# Patient Record
Sex: Female | Born: 1945 | Race: White | Hispanic: No | Smoking: Current every day smoker
Health system: Southern US, Community
[De-identification: ages and names within clinical notes are randomized; demographics above are authoritative.]

## PROBLEM LIST (undated history)

## (undated) DIAGNOSIS — R5383 Other fatigue: Secondary | ICD-10-CM

## (undated) DIAGNOSIS — R0602 Shortness of breath: Secondary | ICD-10-CM

## (undated) DIAGNOSIS — G709 Myoneural disorder, unspecified: Secondary | ICD-10-CM

## (undated) DIAGNOSIS — F209 Schizophrenia, unspecified: Secondary | ICD-10-CM

## (undated) DIAGNOSIS — J449 Chronic obstructive pulmonary disease, unspecified: Secondary | ICD-10-CM

## (undated) DIAGNOSIS — I2510 Atherosclerotic heart disease of native coronary artery without angina pectoris: Secondary | ICD-10-CM

## (undated) DIAGNOSIS — M1990 Unspecified osteoarthritis, unspecified site: Secondary | ICD-10-CM

## (undated) DIAGNOSIS — F319 Bipolar disorder, unspecified: Secondary | ICD-10-CM

## (undated) DIAGNOSIS — F32A Depression, unspecified: Secondary | ICD-10-CM

## (undated) DIAGNOSIS — I10 Essential (primary) hypertension: Secondary | ICD-10-CM

## (undated) DIAGNOSIS — E559 Vitamin D deficiency, unspecified: Secondary | ICD-10-CM

## (undated) DIAGNOSIS — F329 Major depressive disorder, single episode, unspecified: Secondary | ICD-10-CM

## (undated) DIAGNOSIS — F17200 Nicotine dependence, unspecified, uncomplicated: Secondary | ICD-10-CM

## (undated) DIAGNOSIS — R5381 Other malaise: Secondary | ICD-10-CM

## (undated) DIAGNOSIS — Z9981 Dependence on supplemental oxygen: Secondary | ICD-10-CM

## (undated) DIAGNOSIS — K5900 Constipation, unspecified: Secondary | ICD-10-CM

## (undated) DIAGNOSIS — E785 Hyperlipidemia, unspecified: Secondary | ICD-10-CM

## (undated) HISTORY — DX: Constipation, unspecified: K59.00

## (undated) HISTORY — DX: Nicotine dependence, unspecified, uncomplicated: F17.200

## (undated) HISTORY — DX: Essential (primary) hypertension: I10

## (undated) HISTORY — PX: ABDOMINAL EXPLORATION SURGERY: SHX538

## (undated) HISTORY — DX: Other fatigue: R53.81

## (undated) HISTORY — PX: VAGINAL HYSTERECTOMY: SUR661

## (undated) HISTORY — PX: HAND SURGERY: SHX662

## (undated) HISTORY — DX: Atherosclerotic heart disease of native coronary artery without angina pectoris: I25.10

## (undated) HISTORY — PX: CORONARY ANGIOPLASTY WITH STENT PLACEMENT: SHX49

## (undated) HISTORY — DX: Myoneural disorder, unspecified: G70.9

## (undated) HISTORY — DX: Other fatigue: R53.83

## (undated) HISTORY — DX: Chronic obstructive pulmonary disease, unspecified: J44.9

## (undated) HISTORY — PX: INGUINAL HERNIA REPAIR: SUR1180

## (undated) HISTORY — PX: APPENDECTOMY: SHX54

## (undated) HISTORY — DX: Vitamin D deficiency, unspecified: E55.9

## (undated) HISTORY — PX: TONSILLECTOMY: SUR1361

## (undated) HISTORY — DX: Unspecified osteoarthritis, unspecified site: M19.90

## (undated) HISTORY — DX: Hyperlipidemia, unspecified: E78.5

## (undated) HISTORY — DX: Bipolar disorder, unspecified: F31.9

---

## 1999-09-10 ENCOUNTER — Inpatient Hospital Stay (HOSPITAL_COMMUNITY): Admission: EM | Admit: 1999-09-10 | Discharge: 1999-09-12 | Payer: Self-pay | Admitting: Emergency Medicine

## 1999-09-10 ENCOUNTER — Encounter: Payer: Self-pay | Admitting: Emergency Medicine

## 1999-09-11 HISTORY — PX: CORONARY ANGIOPLASTY: SHX604

## 2000-01-29 ENCOUNTER — Encounter: Payer: Self-pay | Admitting: Emergency Medicine

## 2000-01-30 ENCOUNTER — Inpatient Hospital Stay (HOSPITAL_COMMUNITY): Admission: EM | Admit: 2000-01-30 | Discharge: 2000-02-02 | Payer: Self-pay | Admitting: Psychiatry

## 2002-05-29 ENCOUNTER — Other Ambulatory Visit: Admission: RE | Admit: 2002-05-29 | Discharge: 2002-05-29 | Payer: Self-pay | Admitting: Gynecology

## 2002-11-26 ENCOUNTER — Encounter
Admission: RE | Admit: 2002-11-26 | Discharge: 2003-02-24 | Payer: Self-pay | Admitting: Physical Medicine & Rehabilitation

## 2003-05-12 ENCOUNTER — Encounter
Admission: RE | Admit: 2003-05-12 | Discharge: 2003-08-10 | Payer: Self-pay | Admitting: Physical Medicine & Rehabilitation

## 2003-05-28 ENCOUNTER — Encounter
Admission: RE | Admit: 2003-05-28 | Discharge: 2003-05-28 | Payer: Self-pay | Admitting: Physical Medicine & Rehabilitation

## 2003-05-29 ENCOUNTER — Encounter
Admission: RE | Admit: 2003-05-29 | Discharge: 2003-05-29 | Payer: Self-pay | Admitting: Physical Medicine & Rehabilitation

## 2003-06-06 ENCOUNTER — Emergency Department (HOSPITAL_COMMUNITY): Admission: EM | Admit: 2003-06-06 | Discharge: 2003-06-06 | Payer: Self-pay | Admitting: Emergency Medicine

## 2003-06-09 ENCOUNTER — Emergency Department (HOSPITAL_COMMUNITY): Admission: EM | Admit: 2003-06-09 | Discharge: 2003-06-09 | Payer: Self-pay | Admitting: Emergency Medicine

## 2003-06-13 ENCOUNTER — Emergency Department (HOSPITAL_COMMUNITY): Admission: EM | Admit: 2003-06-13 | Discharge: 2003-06-13 | Payer: Self-pay | Admitting: Emergency Medicine

## 2003-06-15 ENCOUNTER — Ambulatory Visit (HOSPITAL_COMMUNITY): Admission: RE | Admit: 2003-06-15 | Discharge: 2003-06-15 | Payer: Self-pay | Admitting: Emergency Medicine

## 2003-08-12 ENCOUNTER — Encounter
Admission: RE | Admit: 2003-08-12 | Discharge: 2003-11-10 | Payer: Self-pay | Admitting: Physical Medicine & Rehabilitation

## 2004-01-05 ENCOUNTER — Encounter
Admission: RE | Admit: 2004-01-05 | Discharge: 2004-04-04 | Payer: Self-pay | Admitting: Physical Medicine & Rehabilitation

## 2004-05-23 ENCOUNTER — Encounter
Admission: RE | Admit: 2004-05-23 | Discharge: 2004-08-21 | Payer: Self-pay | Admitting: Physical Medicine & Rehabilitation

## 2004-05-24 ENCOUNTER — Ambulatory Visit: Payer: Self-pay | Admitting: Physical Medicine & Rehabilitation

## 2004-09-18 ENCOUNTER — Encounter
Admission: RE | Admit: 2004-09-18 | Discharge: 2004-12-17 | Payer: Self-pay | Admitting: Physical Medicine & Rehabilitation

## 2004-09-20 ENCOUNTER — Ambulatory Visit: Payer: Self-pay | Admitting: Physical Medicine & Rehabilitation

## 2005-01-11 ENCOUNTER — Encounter
Admission: RE | Admit: 2005-01-11 | Discharge: 2005-04-11 | Payer: Self-pay | Admitting: Physical Medicine & Rehabilitation

## 2005-01-11 ENCOUNTER — Ambulatory Visit: Payer: Self-pay | Admitting: Physical Medicine & Rehabilitation

## 2005-04-25 ENCOUNTER — Encounter
Admission: RE | Admit: 2005-04-25 | Discharge: 2005-07-24 | Payer: Self-pay | Admitting: Physical Medicine & Rehabilitation

## 2005-04-25 ENCOUNTER — Ambulatory Visit: Payer: Self-pay | Admitting: Physical Medicine & Rehabilitation

## 2005-08-22 ENCOUNTER — Ambulatory Visit: Payer: Self-pay | Admitting: Physical Medicine & Rehabilitation

## 2005-08-22 ENCOUNTER — Encounter
Admission: RE | Admit: 2005-08-22 | Discharge: 2005-11-20 | Payer: Self-pay | Admitting: Physical Medicine & Rehabilitation

## 2005-12-14 ENCOUNTER — Encounter
Admission: RE | Admit: 2005-12-14 | Discharge: 2006-03-14 | Payer: Self-pay | Admitting: Physical Medicine & Rehabilitation

## 2005-12-14 ENCOUNTER — Ambulatory Visit: Payer: Self-pay | Admitting: Physical Medicine & Rehabilitation

## 2006-04-05 ENCOUNTER — Ambulatory Visit: Payer: Self-pay | Admitting: Physical Medicine & Rehabilitation

## 2006-04-05 ENCOUNTER — Encounter
Admission: RE | Admit: 2006-04-05 | Discharge: 2006-07-04 | Payer: Self-pay | Admitting: Physical Medicine & Rehabilitation

## 2006-06-26 ENCOUNTER — Ambulatory Visit: Payer: Self-pay | Admitting: Physical Medicine & Rehabilitation

## 2006-08-23 ENCOUNTER — Encounter
Admission: RE | Admit: 2006-08-23 | Discharge: 2006-11-21 | Payer: Self-pay | Admitting: Physical Medicine & Rehabilitation

## 2006-08-26 ENCOUNTER — Ambulatory Visit: Payer: Self-pay | Admitting: Physical Medicine & Rehabilitation

## 2006-09-05 ENCOUNTER — Encounter
Admission: RE | Admit: 2006-09-05 | Discharge: 2006-12-04 | Payer: Self-pay | Admitting: Physical Medicine & Rehabilitation

## 2006-09-09 ENCOUNTER — Ambulatory Visit: Payer: Self-pay | Admitting: Physical Medicine & Rehabilitation

## 2006-10-31 ENCOUNTER — Ambulatory Visit: Payer: Self-pay | Admitting: Physical Medicine & Rehabilitation

## 2007-02-19 ENCOUNTER — Ambulatory Visit: Payer: Self-pay | Admitting: Physical Medicine & Rehabilitation

## 2007-02-19 ENCOUNTER — Encounter
Admission: RE | Admit: 2007-02-19 | Discharge: 2007-04-15 | Payer: Self-pay | Admitting: Physical Medicine & Rehabilitation

## 2007-06-10 ENCOUNTER — Ambulatory Visit: Payer: Self-pay | Admitting: Physical Medicine & Rehabilitation

## 2007-06-10 ENCOUNTER — Encounter
Admission: RE | Admit: 2007-06-10 | Discharge: 2007-06-12 | Payer: Self-pay | Admitting: Physical Medicine & Rehabilitation

## 2007-09-30 ENCOUNTER — Ambulatory Visit: Payer: Self-pay | Admitting: Physical Medicine & Rehabilitation

## 2007-09-30 ENCOUNTER — Encounter
Admission: RE | Admit: 2007-09-30 | Discharge: 2007-10-01 | Payer: Self-pay | Admitting: Physical Medicine & Rehabilitation

## 2008-01-27 ENCOUNTER — Encounter
Admission: RE | Admit: 2008-01-27 | Discharge: 2008-01-28 | Payer: Self-pay | Admitting: Physical Medicine & Rehabilitation

## 2008-01-28 ENCOUNTER — Ambulatory Visit: Payer: Self-pay | Admitting: Physical Medicine & Rehabilitation

## 2008-05-18 ENCOUNTER — Encounter
Admission: RE | Admit: 2008-05-18 | Discharge: 2008-05-19 | Payer: Self-pay | Admitting: Physical Medicine & Rehabilitation

## 2008-05-19 ENCOUNTER — Ambulatory Visit: Payer: Self-pay | Admitting: Physical Medicine & Rehabilitation

## 2008-07-07 ENCOUNTER — Encounter: Admission: RE | Admit: 2008-07-07 | Discharge: 2008-07-07 | Payer: Self-pay | Admitting: Internal Medicine

## 2008-09-28 ENCOUNTER — Ambulatory Visit: Payer: Self-pay | Admitting: Physical Medicine & Rehabilitation

## 2008-09-28 ENCOUNTER — Encounter
Admission: RE | Admit: 2008-09-28 | Discharge: 2008-10-08 | Payer: Self-pay | Admitting: Physical Medicine & Rehabilitation

## 2009-09-09 ENCOUNTER — Emergency Department (HOSPITAL_COMMUNITY): Admission: EM | Admit: 2009-09-09 | Discharge: 2009-09-09 | Payer: Self-pay | Admitting: Emergency Medicine

## 2010-08-05 ENCOUNTER — Encounter: Payer: Self-pay | Admitting: Physical Medicine & Rehabilitation
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
## 2010-11-28 NOTE — Assessment & Plan Note (Signed)
The patient with rheumatoid arthritis with chronic neck pain, history of  cervical spondylosis as well.  She also has history of schizophrenia and  bipolar disorder.  She has been maintained at the [facility].  She  was last seen by Dr. Thomasena Edis on November 2009.  We checked the urine  drug screen while reportedly taking 7-9 hydrocodone today.  She had  negative hydrocodone on her urine drug screen.  Similarly, she had  negative Xanax on her urine drug screen dated September 21, 2008.   I went over these results with the patient and indicated that I am not  comfortable prescribing narcotic analgesics in this situation.   We offered a list of other Pain Clinics, but would not specifically  refer her because of the inconsistency of her urine drug screen.  She  understands the situation.  I have sent a note to her psychiatrist at  [facility], Dr. Rollen Sox.   We will see her back on a p.r.n. basis.      Erick Colace, M.D.  Electronically Signed     AEK/MedQ  D:  09/28/2008 17:09:52  T:  09/29/2008 04:50:10  Job #:  [id number]   cc:   Natasha Mead, M.D.  Fax: [phone]

## 2010-12-01 NOTE — Cardiovascular Report (Signed)
[facility]  Patient:    JIANNI, SHELDEN                        MRN: [id number] Proc. Date: 09/11/99 Adm. Date:  [id number] Attending:  Loreli Dollar                        Cardiac Catheterization  INDICATIONS FOR PROCEDURE:  A 65 year old female admitted with chest pain, negative cardiac enzymes, but persistent chest pain despite IV nitroglycerin and heparin. The patient was brought to the catheterization lab for cardiac catheterization nd intervention if necessary.  DESCRIPTION OF PROCEDURE:  After obtaining informed consent, the patient was prepped and draped in the usual sterile fashion exposing the right groin. Applying local anesthetic of 1% Xylocaine, the Seldinger technique was employed, and a 6-French introducer sheath was placed into the right femoral artery.  Selective  right and left arteriography and ventriculography was performed.  RESULTS:  Hemodynamic monitoring:  Central aortic pressure was 104/63.  Left ventricular pressure 107/19, and there was no significant valve gradient noted at the time f pullback.  Ventriculography:  Ventriculography in the RAO projection was associated with marked ectopy.  The patient had normal systolic motion.  Ejection fraction was ot calculated because of the ectopy.  There was mitral regurgitation also noted because of the ectopy.  The end diastolic pressure was only 16.  Coronary arteriography:  There was calcification noted in the proximal LAD. 1. The left main:  There was actually a common ostium for both the circumflex and    the LAD with no true left main noted.  Subselective evaluation of both the LAD    and circumflex were therefore performed. 2. The LAD was a medium sized vessel that extended down to the apex of the heart.    There were some mild irregularities throughout the LAD and even in the distal    segments.  The first diagonal had minor irregularities.  On two  views, there was    concern that there may be ostial narrowing of the LAD, and a 6-French catheter    actually damped each time it was truly engaged.  I could not see reflux of the    contrast around the catheter raising the concern that there may be ostial    narrowing of the LAD.  Multiple attempts (greater than 15) to just evaluate his    area subselectively were obtained, but I could not make a firm decision.    Because of this, IVUS was used and actually showed only a 40% area of narrowing    with a 3.0 lumen (see below). 3. Circumflex:  The circumflex was a large vessel, about 4.5 mm in diameter. The    circumflex was a dominant vessel with mild irregularities throughout.  There    were a total of five OMs including the PDA.  The first OM had mild    irregularities.  The second OM had an area that was about 15 mm in length with    a maximum stenosis in the mid portion of this of about 80%.  The distal vessel    was free of disease.  The other OMs were not severely narrowed. 4. The right coronary artery is a small nondominant vessel.  Because of the concern over the ostium of the LAD, Dr. Jenne Campus was summoned, and the decision was made to do IVUS.  A  6-French 3.5 left guide catheter was used. A Trooper wire was placed in the LAD, and the IVUS device was placed well distal o the vessel with pullback being obtained.  This revealed the vessel being about mm in diameter with a lumen of about 3-3.5.  There was some calcification noted. n the ostial portion of the LAD, there was an eccentric area of calcification of about 40% narrowing.  There was still a 3.0 mm diameter lumen.  Having resolved the issue of the LAD, attention was then addressed to the obtuse marginal vessel.  A 6-French 3.5 guide catheter to the Trooper wire, and a 3.0  20-mm CrossSail balloon was used.  The guide wire was placed distal to the area of the obstruction in the second marginal vessel.  The  balloon was positioned in the area, and a total of three inflations, five x 55, eight x 60, and six x 60 seconds were performed.  With this, the area of narrowing was reduced to less than 20%.  There was a small area of focal dissection, but there was brisk TIMI-3 flow.  I did not feel that this area needed to be stented.  To do that would mean that I would have to cross the ostium of the first OM vessel which I did not want to do. The patient was given Integrilin single bolus and will be maintained on Integrilin or 80 hours.  She should be removed later today and be discharged in the morning.  PROCEDURES: 1. Left heart catheterization. 2. Ventriculography, RAO projection. 3. Selective right and left coronary arteriography. 4. Intravascular ultrasound of the left anterior descending artery. 5. Angioplasty to the obtuse marginal #2. DD:  09/11/99 TD:  09/11/99 Job: [id number]

## 2010-12-01 NOTE — Procedures (Signed)
NAME:  [name], Hill                           ACCOUNT NO.:  [id number]   MEDICAL RECORD NO.:  [id number]                   PATIENT TYPE:  REC   LOCATION:  [facility]                                  FACILITY:  [facility]   PHYSICIAN:  Celene Kras, MD                     DATE OF BIRTH:  1946-07-16   DATE OF PROCEDURE: 01/11/2004  DATE OF DISCHARGE:  01/11/2004                                 OPERATIVE REPORT   PATIENT:  Caroline Del.   SURGEON:  Jewel Baize. Stevphen Rochester, M.D.   [facility] and I evaluated  her.  I have reviewed the Health and history form. I reviewed the 14 point  review of systems. I have reviewed the chart, reviewed overall directed care  approach. We are going to go ahead today and proceed with lumbar epidural,  as it is clinically indicated through the patient's level of pain, perceived  impairment, and impairment in quality of life.  I will also add Soma as a  coanalgesic and adjunct to oxycodone which she breaks through.  This is  probably a short term medication to be utilizing sparingly and appropriately  to improve the epidural efficacy, which was very effective last year.  Consider a transforaminal approach in followup.  We plan L3-4. Directed  leftward. She is consented.   OBJECTIVE:  Diffuse paralumbar myofascial discomfort, pain over her PSIS.  Slightly decreased patellar jerk, 1+, but no other overt neurological  deficits, motor, sensory or reflexive. Straight leg lift is markedly  impaired to the left but EHL is fine.   IMPRESSION:  Degenerative spinal disease of the lumbar spine, radiculopathy.   PLAN:  Lumbar epidural, she is consented.   DESCRIPTION OF PROCEDURE:  The patient taken to the fluoroscopy suite,  placed in the prone position, back prepped and draped in the usual fashion.  Using a Hustead needle, I advanced to the L3-4 interspace directed leftward.  I confirmed placement in multiple fluoroscopic positions.  I  then obtained  loss of resistance without CSF, heme or paresthesias, was performed under  local anesthetic.  I then inject 40 mg of Aristocort and flush the needle.   The patient tolerated the procedure well with no complications from our  procedure.  Discharge instructions given.  Lifestyle enhancements reviewed.  Cigarette cessation for best outcome.  Maintain contact with Dr. Thomasena Edis.                                                Celene Kras, MD    HH/MEDQ  D:  01/11/2004 09:30:12  T:  01/11/2004 12:04:17  Job:  [id number]
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
## 2010-12-01 NOTE — Procedures (Signed)
NAME:  Caroline Hill, Caroline Hill                           ACCOUNT NO.:  [id number]   MEDICAL RECORD NO.:  [id number]                   PATIENT TYPE:   LOCATION:                                       FACILITY:  [facility]   PHYSICIAN:  Celene Kras, MD                     DATE OF BIRTH:  [birth year]   DATE OF PROCEDURE:  DATE OF DISCHARGE:                                 OPERATIVE REPORT   HISTORY:  The patient comes to [place] today.  She is responding to lumbar  epidural.  We plan a transluminal injection, left side.  Consider a  transforaminal injection.  She does lateralize to the left.  She does have  right-sided pain, so will stay the course and move forward with another  transluminal injection.  She has no advancing neurological features.  Review  medications.  Another rationale for performing injections, is to minimize  escalation of narcotic-based pain medication.  She has consented.   PHYSICAL EXAMINATION:  NEUROLOGIC:  Objective diffuse paralumbar myofascial  discomfort, seems to be improved with improved range of motion, neurological  findings with motor, sensory and reflexes.   IMPRESSION:  Degenerative spine disease of the lumbar spine.   PLAN:  A lumbar epidural.  She has consented.   DESCRIPTION OF PROCEDURE:  The patient is taken to the fluoroscopy suite and  placed in the prone position.  The back was prepped and draped in the usual  fashion using the Hustead needle. I advanced to the L3-4 interspace,  directed leftward.  No evidence of CSF, heme, or paresthesias.  A test block  uneventfully, followed by 40 mg of Aristocort.  I flushed the needle.  She tolerated the procedure well with no complications from our procedure.   DISPOSITION:  Discharge instructions were given.                                                Celene Kras, MD    HH/MEDQ  D:  01/25/2004 11:30:32  T:  01/25/2004 12:24:59  Job:  [id number]
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
## 2010-12-01 NOTE — Procedures (Signed)
NAME:  Caroline Hill, Caroline Hill                 ACCOUNT NO.:  [id number]   MEDICAL RECORD NO.:  [id number]          PATIENT TYPE:  REC   LOCATION:  [facility]                          FACILITY:  [facility]   PHYSICIAN:  Erick Colace, M.D.DATE OF BIRTH:  04-13-1946   DATE OF PROCEDURE:  DATE OF DISCHARGE:                               OPERATIVE REPORT   L3-L4 translaminar lumbar epidural steroid injection, left paramedian  approach.   INDICATIONS:  Lumbar spondylosis with lower extremity radicular pain.  Previously relieved with lumbar epidural steroid injection  June28,2005.  Pain is only partially responsive to narcotic  analgesic medications.   DESCRIPTION OF PROCEDURE:  Informed consent was obtained after  describing the risks and benefits of the procedure, and the patient  elects to proceed.  The patient was placed prone on fluoroscopy table.  Betadine prep and sterile drape.  A 25 gauge 1-1/2 inch needle was used  to incise the skin and subcutaneous tissue.  Then 1% lidocaine 2 mL and  an 18 gauge 8 cm Husted needle was inserted under fluoroscopic guidance  (left L3-4 translaminar space, left paramedian approach), contacting the  inferior aspect of the L3 lamina needle directed downward; and then,  loss of resistance obtained using sterile saline/air  50/50 mix.  Then a  solution containing Omnipaque 180 injected x1 mL, demonstrated good  epidural flow and no evidence of intravascular uptake.  Followed by  injection of 1 mL of 40 mL Depo-Medrol plus 2 mL of 1% lidocaine MPF.  The patient tolerated the procedure well.  Pre and Post injection vitals  stable.  She will see Dr. Thomasena Edis next month and repeat injection as  needed.      Erick Colace, M.D.  Electronically Signed     AEK/MEDQ  D:  09/30/2006 12:32:32  T:  09/30/2006 21:10:48  Job:  [id number]
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
## 2010-12-01 NOTE — Procedures (Signed)
NAME:  Caroline Hill, Caroline Hill                 ACCOUNT NO.:  [id number]   MEDICAL RECORD NO.:  [id number]          PATIENT TYPE:  REC   LOCATION:  [facility]                          FACILITY:  [facility]   PHYSICIAN:  Erick Colace, M.D.DATE OF BIRTH:  [birth year]   DATE OF PROCEDURE:  09/09/2006  DATE OF DISCHARGE:                               OPERATIVE REPORT   PROCEDURE:  L3-4 translaminar epidural steroid injection under  fluoroscopic guidance left paramedian.   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  She does have hip pain.  Her back pain and hip pain have been previously relieved with epidural  steroid injections performed by Dr. Ethelene Hal in 2004 and Dr. Stevphen Rochester x3 in  2005.  She has had none in 2006 or 2007 or 2008.  Her pain is only  partially responsive to medication management which includes  hydrocodone, taking approximately 6 tablets per day.   Informed consent was obtained after describing risks and benefits of the  procedure to the patient.  These include bleeding, bruising, infection,  loss of bowel and bladder function, temporary or permanent paralysis.  She elects to proceed and has given written consent.  Patient placed  prone on fluoroscopy table, Betadine prep and sterile drape.  A 25 gauge  1.5-inch needle was used to anesthetize the skin and subcu tissue with  1% lidocaine x2 mL.  An 18 gauge Hustead needle was inserted, targeting  the inferior aspect of the L3 inferior lamina.  Bone contact made,  confirmed with lateral imaging.  Needle redirected inferiorly.  Loss of  resistance technique employed, obtained live fluoro.  Injection of  Omnipaque 180 demonstrated good epidural spread, followed by injection  of 2 mL of 40 mg/mL Depo-Medrol plus 2 mL of 1% lidocaine MPF.  Patient  tolerated procedure well.  Return in 3 weeks, given her previous history  of requiring repeat injections for pain relief.      Erick Colace, M.D.  Electronically Signed     AEK/MEDQ  D:   09/09/2006 13:11:09  T:  09/09/2006 16:08:31  Job:  [id number]
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
## 2010-12-01 NOTE — Procedures (Signed)
NAME:  Caroline Hill, Hill                           ACCOUNT NO.:  [id number]   MEDICAL RECORD NO.:  [id number]                   PATIENT TYPE:  REC   LOCATION:  [facility]                                  FACILITY:  [facility]   PHYSICIAN:  Celene Kras, MD                     DATE OF BIRTH:  Aug 21, 1945   DATE OF PROCEDURE:  02/29/2004  DATE OF DISCHARGE:                                 OPERATIVE REPORT   SURGEON:  Hans C. Stevphen Rochester, M.D.   Caroline Hill Hill [facility] and I evaluated  her. I have reviewed the Health and history form. I reviewed the 14 point  review of systems.   1. Caroline Hill has done very well with lumbar epidural injection improving her     function and quality of life, and more importantly, she has not escalated     on her narcotic-based pain medications.  2. She has a significant facetal overlay, particularly to the right side,     which we may address at a later date, consistent with her most     problematic symptoms. I would consider injecting the facet or approach by     transforaminal approach, but she had done fairly well with the     translaminar approach, and I think this is our best approach at this time     and then follow her expectantly.  3. Another rationale for performing injection is to minimize escalation of     narcotic based pain medication.   OBJECTIVE:  Diffuse paralumbar myofascial discomfort, pain over PSIS,  notable pain on extension. Gaenslen's and Patrick's equivocal. No new  neurological findings motor, sensory or reflexive.   IMPRESSION:  Degenerative spine disease of the lumbar spine, facet syndrome.   PLAN:  Lumbar epidural. She is consented.   The patient was taken to the fluoroscopy suite and placed in prone position.  Back was prepped and draped in the usual fashion. Using a Hustead needle, I  advanced under local anesthetic to the L5-S1 interspace. I confirmed  placement. There is no evidence of CSF, heme, or paresthesia  __________  uneventfully, followed with 40 mg of Aristocort and flushed needle.   She tolerated the procedure well. No complication from the procedure.  Appropriate recovery. Discharge instructions given.                                                Celene Kras, MD    HH/MEDQ  D:  02/29/2004 09:53:31  T:  02/29/2004 16:29:03  Job:  [id number]

## 2011-07-06 ENCOUNTER — Encounter (INDEPENDENT_AMBULATORY_CARE_PROVIDER_SITE_OTHER): Payer: Self-pay | Admitting: Surgery

## 2011-07-30 ENCOUNTER — Encounter (INDEPENDENT_AMBULATORY_CARE_PROVIDER_SITE_OTHER): Payer: Self-pay | Admitting: Surgery

## 2011-07-31 ENCOUNTER — Encounter (INDEPENDENT_AMBULATORY_CARE_PROVIDER_SITE_OTHER): Payer: Self-pay | Admitting: Surgery

## 2011-07-31 ENCOUNTER — Ambulatory Visit (INDEPENDENT_AMBULATORY_CARE_PROVIDER_SITE_OTHER): Payer: Medicare Other | Admitting: Surgery

## 2011-07-31 ENCOUNTER — Encounter: Payer: Self-pay | Admitting: Internal Medicine

## 2011-07-31 VITALS — BP 132/88 | HR 96 | Temp 98.5°F | Resp 20 | Ht 66.0 in | Wt 125.8 lb

## 2011-07-31 DIAGNOSIS — K5900 Constipation, unspecified: Secondary | ICD-10-CM

## 2011-07-31 DIAGNOSIS — K4090 Unilateral inguinal hernia, without obstruction or gangrene, not specified as recurrent: Secondary | ICD-10-CM | POA: Insufficient documentation

## 2011-07-31 DIAGNOSIS — K5909 Other constipation: Secondary | ICD-10-CM | POA: Insufficient documentation

## 2011-07-31 NOTE — Progress Notes (Signed)
Patient ID: Caroline Hill, female   DOB: Apr 22, 1946, 66 y.o.   MRN: [id number]  Chief Complaint  Patient presents with  . Other    new pt- eval hernia    HPI Caroline Hill is a 66 y.o. female.  Referred by Dr. Tomi Bamberger HPI 66 yo female presents with a known left inguinal hernia for at least 8 years.  It has enlarged significantly and is causing some discomfort. She has developed a new problem with constipation over the last 6 months.  The patient has never had any colon cancer screening.  She also reports never having a mammogram.  She denies blood in her bowel movements.  Her hernia hurts more when she is trying to have a bowel movement. Past Medical History  Diagnosis Date  . Hypertension   . Bipolar disorder, unspecified   . Unspecified constipation   . Other malaise and fatigue   . Unspecified vitamin D deficiency   . Tobacco use disorder   . Neuromuscular disorder   . Hyperlipidemia   . Arthritis     Past Surgical History  Procedure Date  . Hand surgery   . Appendectomy     Family History  Problem Relation Age of Onset  . Heart disease Mother     Social History History  Substance Use Topics  . Smoking status: Current Everyday Smoker -- 0.5 packs/day  . Smokeless tobacco: Not on file  . Alcohol Use: No    No Known Allergies  Current Outpatient Prescriptions  Medication Sig Dispense Refill  . amLODipine-benazepril (LOTREL) 10-20 MG per capsule Take 1 capsule by mouth daily.        Marland Kitchen amphetamine-dextroamphetamine (ADDERALL) 15 MG tablet Take 15 mg by mouth daily.        . clonazePAM (KLONOPIN) 0.5 MG tablet Take 0.25 mg by mouth 2 (two) times daily as needed.        Marland Kitchen FLUoxetine (PROZAC) 20 MG tablet Take 20 mg by mouth daily.        Marland Kitchen lamoTRIgine (LAMICTAL) 100 MG tablet Take 100 mg by mouth daily.        . paliperidone (INVEGA) 9 MG 24 hr tablet Take 9 mg by mouth every morning.        . traZODone (DESYREL) 100 MG tablet Take 100 mg by mouth at bedtime.           Review of Systems Review of Systems  Constitutional: Negative for fever, chills and unexpected weight change.  HENT: Negative for hearing loss, congestion, sore throat, trouble swallowing and voice change.   Eyes: Negative for visual disturbance.  Respiratory: Negative for cough and wheezing.   Cardiovascular: Negative for chest pain, palpitations and leg swelling.  Gastrointestinal: Positive for abdominal pain and constipation. Negative for nausea, vomiting, diarrhea, blood in stool, abdominal distention and anal bleeding.  Genitourinary: Negative for hematuria, vaginal bleeding and difficulty urinating.  Musculoskeletal: Negative for arthralgias.  Skin: Negative for rash and wound.  Neurological: Negative for seizures, syncope and headaches.  Hematological: Negative for adenopathy. Does not bruise/bleed easily.  Psychiatric/Behavioral: Negative for confusion.    Blood pressure 132/88, pulse 96, temperature 98.5 F (36.9 C), temperature source Temporal, resp. rate 20, height 5\' 6"  (1.676 m), weight 125 lb 12.8 oz (57.063 kg).  Physical Exam Physical Exam WDWN in NAD HEENT:  EOMI, sclera anicteric Neck:  No masses, no thyromegaly Lungs:  CTA bilaterally; normal respiratory effort CV:  Regular rate and rhythm; no murmurs Abd:  +  bowel sounds, soft, non-tender, no masses GU:  Palpable mass in left groin, reducible; enlarges with Valsalva maneuver.  No sign of right inguinal hernia Ext:  Well-perfused; no edema Skin:  Warm, dry; no sign of jaundice  Data Reviewed None  Assessment    Left inguinal hernia No previous colon cancer screening     Plan    Prior to hernia repair, I believe that the prudent approach would be to first have a screening colonoscopy to rule out other etiologies for her constipation.  Certainly, the hernia could be a reason for her constipation.  If the colonoscopy is negative, then we will proceed with hernia repair with mesh.    She also needs  to have a screening mammogram.       Floyd Wade K. 07/31/2011, 10:33 AM

## 2011-07-31 NOTE — Patient Instructions (Signed)
We will set up a referral to gastroenterology for you to have a colonoscopy.  After the colonoscopy is complete, I would like to see you back to discuss hernia repair.

## 2011-08-06 ENCOUNTER — Encounter (INDEPENDENT_AMBULATORY_CARE_PROVIDER_SITE_OTHER): Payer: Self-pay | Admitting: Nurse Practitioner

## 2011-08-07 ENCOUNTER — Encounter: Payer: Self-pay | Admitting: Internal Medicine

## 2011-08-09 ENCOUNTER — Ambulatory Visit (INDEPENDENT_AMBULATORY_CARE_PROVIDER_SITE_OTHER): Payer: Medicare Other | Admitting: Internal Medicine

## 2011-08-09 ENCOUNTER — Encounter: Payer: Self-pay | Admitting: Internal Medicine

## 2011-08-09 DIAGNOSIS — R1032 Left lower quadrant pain: Secondary | ICD-10-CM

## 2011-08-09 DIAGNOSIS — J449 Chronic obstructive pulmonary disease, unspecified: Secondary | ICD-10-CM | POA: Insufficient documentation

## 2011-08-09 DIAGNOSIS — F1210 Cannabis abuse, uncomplicated: Secondary | ICD-10-CM | POA: Insufficient documentation

## 2011-08-09 DIAGNOSIS — Z1211 Encounter for screening for malignant neoplasm of colon: Secondary | ICD-10-CM

## 2011-08-09 DIAGNOSIS — I10 Essential (primary) hypertension: Secondary | ICD-10-CM | POA: Insufficient documentation

## 2011-08-09 DIAGNOSIS — R198 Other specified symptoms and signs involving the digestive system and abdomen: Secondary | ICD-10-CM

## 2011-08-09 DIAGNOSIS — M1990 Unspecified osteoarthritis, unspecified site: Secondary | ICD-10-CM | POA: Insufficient documentation

## 2011-08-09 DIAGNOSIS — F419 Anxiety disorder, unspecified: Secondary | ICD-10-CM | POA: Insufficient documentation

## 2011-08-09 DIAGNOSIS — F319 Bipolar disorder, unspecified: Secondary | ICD-10-CM | POA: Insufficient documentation

## 2011-08-09 DIAGNOSIS — Z720 Tobacco use: Secondary | ICD-10-CM | POA: Insufficient documentation

## 2011-08-09 DIAGNOSIS — E785 Hyperlipidemia, unspecified: Secondary | ICD-10-CM | POA: Insufficient documentation

## 2011-08-09 DIAGNOSIS — F17200 Nicotine dependence, unspecified, uncomplicated: Secondary | ICD-10-CM

## 2011-08-09 DIAGNOSIS — R194 Change in bowel habit: Secondary | ICD-10-CM

## 2011-08-09 MED ORDER — POLYETHYLENE GLYCOL 3350 17 G PO PACK: 17.0000 g | PACK | Freq: Every day | ORAL | Status: AC

## 2011-08-09 MED ORDER — PEG-KCL-NACL-NASULF-NA ASC-C 100 G PO SOLR: 1.0000 | Freq: Once | ORAL | Status: DC

## 2011-08-09 NOTE — Patient Instructions (Addendum)
You have been scheduled for a colonoscopy. Please follow written instructions given to you at your visit today.  Please pick up your prep kit at the pharmacy within the next 2-3 days.  We have sent the following medications to your pharmacy for you to pick up at your convenience: Moviprep, Miralax

## 2011-08-09 NOTE — Progress Notes (Signed)
Subjective:    Patient ID: Caroline Hill, female    DOB: 1946/04/19, 66 y.o.   MRN: [id number]  HPI Caroline Hill is a 66 yo female with PMH of hypertension, tobacco use, hyperlipidemia, arthritis, COPD, and bipolar disorder who is seen in consultation at the request of Dr. Corliss Skains who change in bowel habits and constipation.  Dr. Corliss Skains saw Caroline Hill recently for possible inguinal hernia repair.  The patient reports over the last 6 months the dramatic change in her bowel habits. She reports she is now significantly constipated and has left lower quadrant abdominal pain associated with defecation. Prior to 6 months ago she was having a bowel movement each morning and each evening. She has tried MiraLAX, but only for approximately 3 days. She reports that she is "scared of" laxatives because her mother had to use them and died at age 65. She was told her mother died of cardiac complications.  Regarding her left lower quadrant pain, she reports this is daily, and intermittent. It is worse with bowel movement. It is been present for 2 or 3 years, but seems to be worsening. She has lost approximately 25 pounds unintentionally, and she does not relate this to medication changes. She has been on Adderall for a year and a half and thinks this may have decreased her appetite, but she does not associate the weight loss with this medication. She denies nausea and vomiting. No dysphagia or odynophagia. Again, appetite is not great, but she is able to eat. She denies blood in her stools or melena.  Review of Systems Constitutional: Negative for fever, chills, night sweats, activity change, appetite change HEENT: Negative for sore throat, mouth sores and trouble swallowing. Eyes: Negative for visual disturbance Respiratory: Negative for cough, chest tightness and shortness of breath Cardiovascular: Negative for chest pain, palpitations and lower extremity swelling Gastrointestinal: See history of present  illness Genitourinary: Negative for dysuria and hematuria. Musculoskeletal: Positive for back pain, arthralgias and negative for myalgias Skin: Negative for rash or color change Neurological: Positive for headaches, negative for weakness, numbness Hematological: Negative for adenopathy, negative for easy bruising/bleeding Psychiatric/behavioral: Positive for depressed mood, positive for anxiety, positive for sleeping problems   Past Medical History  Diagnosis Date  . Hypertension   . Bipolar disorder, unspecified   . Unspecified constipation   . Other malaise and fatigue   . Unspecified vitamin D deficiency   . Tobacco use disorder   . Neuromuscular disorder   . Hyperlipidemia   . Arthritis   . Migraines   . COPD (chronic obstructive pulmonary disease)    Past Surgical History  Procedure Date  . Hand surgery     Bi-lat  . Appendectomy   . Abdominal hysterectomy    Current Outpatient Prescriptions  Medication Sig Dispense Refill  . amLODipine-benazepril (LOTREL) 10-20 MG per capsule Take 1 capsule by mouth daily.        Marland Kitchen amphetamine-dextroamphetamine (ADDERALL) 15 MG tablet Take 15 mg by mouth daily.        . clonazePAM (KLONOPIN) 0.5 MG tablet Take 0.25 mg by mouth 2 (two) times daily as needed.        Marland Kitchen FLUoxetine (PROZAC) 20 MG tablet Take 20 mg by mouth daily.        Marland Kitchen lamoTRIgine (LAMICTAL) 100 MG tablet Take 100 mg by mouth daily.        . MULTIPLE VITAMIN PO Take by mouth.      . paliperidone (INVEGA) 9 MG 24  hr tablet Take 9 mg by mouth every morning.        . traZODone (DESYREL) 100 MG tablet Take 100 mg by mouth at bedtime.        . peg 3350 powder (MOVIPREP) 100 G SOLR Take 1 kit (100 g total) by mouth once.  1 kit  0  . polyethylene glycol (MIRALAX / GLYCOLAX) packet Take 17 g by mouth daily.  14 each  0   No Known Allergies  Family History  Problem Relation Age of Onset  . Heart disease Mother   . Colon cancer Neg Hx     Social History  . Marital  Status: Legally Separated    Number of Children: 2   Occupational History  . disable    Social History Main Topics  . Smoking status: Current Everyday Smoker -- 0.5 packs/day for 45 years    Types: Cigarettes  . Smokeless tobacco: None   Comment: info already given  . Alcohol Use: No  . Drug Use: 1 per week    Special: Marijuana     qd      Objective:   Physical Exam Ht 5\' 6"  (1.676 m)  Wt 125 lb (56.7 kg)  BMI 20.18 kg/m2 Constitutional: Well-developed and well-nourished. No distress. HEENT: Normocephalic and atraumatic. Oropharynx is clear and moist. No oropharyngeal exudate. Conjunctivae are normal. Pupils are equal round and reactive to light. No scleral icterus. Neck: Neck supple. Trachea midline. Cardiovascular: Normal rate, regular rhythm and intact distal pulses. No M/R/G Pulmonary/chest: Effort normal and somewhat distant breath sounds. End expiratory wheezing no rales or rhonchi Abdominal: Soft, tender to palpation left lower quadrant no rebound or guarding, nondistended. Bowel sounds active throughout. There are no masses palpable. No hepatosplenomegaly. Extremities: no clubbing, cyanosis, or edema, changes of arthritis bilateral hands Neurological: Alert and oriented to person place and time. Skin: Skin is warm and dry. No rashes noted. Psychiatric: Normal mood and affect. Behavior is normal.     Assessment & Plan:   66 yo female with PMH of hypertension, tobacco use, hyperlipidemia, arthritis, COPD, and bipolar disorder who is seen in consultation at the request of Dr. Corliss Skains who change in bowel habits and constipation  1. LLQ pain/hernia/change in bowel habits -- the patient does have a left inguinal hernia and Dr. Corliss Skains plans for eventual repair. She also has a significant change in her bowel habits proximally 6 months ago, and this is in conjunction with a 25 pound weight loss. Overall this is concerning, and I have strongly recommended colonoscopy for further  evaluation. She is agreeable to proceed, and we discussed the risks and benefits today. Her constipation may in fact relate to her hernia, however I would like to rule out an obstructive lesion first. I would like her to try MiraLAX 17 g on a daily basis for a longer period of time before considering it a failure. She can even use it twice a day if needed.  If colonoscopy is unrevealing, then I recommend CT scan of her abdomen and pelvis to further evaluate her pain and weight loss. Procedure will be performed with propofol and pediatric colonoscope.  Follow up will be based on findings at colonoscopy and she will follow with general surgery for hernia repair

## 2011-08-13 ENCOUNTER — Encounter: Payer: Self-pay | Admitting: Internal Medicine

## 2011-08-13 ENCOUNTER — Other Ambulatory Visit (INDEPENDENT_AMBULATORY_CARE_PROVIDER_SITE_OTHER): Payer: Medicare Other

## 2011-08-13 ENCOUNTER — Ambulatory Visit (AMBULATORY_SURGERY_CENTER): Payer: Medicare Other | Admitting: Internal Medicine

## 2011-08-13 ENCOUNTER — Telehealth: Payer: Self-pay | Admitting: *Deleted

## 2011-08-13 DIAGNOSIS — R634 Abnormal weight loss: Secondary | ICD-10-CM

## 2011-08-13 DIAGNOSIS — R198 Other specified symptoms and signs involving the digestive system and abdomen: Secondary | ICD-10-CM

## 2011-08-13 DIAGNOSIS — Z1211 Encounter for screening for malignant neoplasm of colon: Secondary | ICD-10-CM

## 2011-08-13 LAB — COMPREHENSIVE METABOLIC PANEL
ALT: 22 U/L (ref 0–35)
AST: 22 U/L (ref 0–37)
Albumin: 3.7 g/dL (ref 3.5–5.2)
Alkaline Phosphatase: 94 U/L (ref 39–117)
BUN: 7 mg/dL (ref 6–23)
CO2: 31 mEq/L (ref 19–32)
Calcium: 9.1 mg/dL (ref 8.4–10.5)
Chloride: 104 mEq/L (ref 96–112)
Creatinine, Ser: 0.7 mg/dL (ref 0.4–1.2)
GFR: 92.18 mL/min (ref >60.00)
Glucose, Bld: 93 mg/dL (ref 70–99)
Potassium: 4.2 mEq/L (ref 3.5–5.1)
Sodium: 143 mEq/L (ref 135–145)
Total Bilirubin: 0.5 mg/dL (ref 0.3–1.2)
Total Protein: 6.8 g/dL (ref 6.0–8.3)

## 2011-08-13 MED ORDER — SODIUM CHLORIDE 0.9 % IV SOLN: 500.0000 mL | INTRAVENOUS | Status: DC

## 2011-08-13 NOTE — Op Note (Signed)
[facility] [address] [place], [place]  [address]  COLONOSCOPY PROCEDURE REPORT  PATIENT:  Caroline Hill, Caroline Hill  MR#:  [id number] BIRTHDATE:  [birth year], 65 yrs. old  GENDER:  female ENDOSCOPIST:  Carie Caddy. Sarye Kath, MD REF. BY:  Manus Rudd, M.D. PROCEDURE DATE:  08/13/2011 PROCEDURE:  Diagnostic Colonoscopy ASA CLASS:  Class III INDICATIONS:  change in bowel habits, Screening MEDICATIONS:   MAC sedation, administered by CRNA, propofol (Diprivan) 250 mg IV  DESCRIPTION OF PROCEDURE:   After the risks benefits and alternatives of the procedure were thoroughly explained, informed consent was obtained.  Digital rectal exam was performed and revealed no rectal masses.   The LB [id number]-[id number] endoscope was introduced through the anus and advanced to the cecum, which was identified by both the appendix and ileocecal valve, without limitations.  The quality of the prep was good, using MoviPrep. The instrument was then slowly withdrawn as the colon was fully examined. <<PROCEDUREIMAGES>>  FINDINGS:  Moderate diverticulosis was found ascending colon to sigmoid colon, most dense in the sigmoid colon where mobility was somewhat restricted.  Small internal hemorrhoids were found. Retroflexed views in the rectum revealed no other findings other than those already described.    The scope was then withdrawn from the cecum and the procedure completed. COMPLICATIONS:  None  ENDOSCOPIC IMPRESSION: 1) Moderate diverticulosis ascending colon to sigmoid colon 2) Internal hemorrhoids  RECOMMENDATIONS: 1) High fiber diet. 2) My office will arrange for you to have a CT scan of chest, abdomen and pelvis given your unexplained weight loss. 3) You should continue to follow colorectal cancer screening guidelines for "routine risk" patients with a repeat colonoscopy in 10 years. There is no need for FOBT (stool) testing for at least 5 years.  Carie Caddy. Rhea Belton, MD  CC:  Manus Rudd,  MD The Patient  n. eSIGNEDCarie Caddy. Neizan Debruhl at 08/13/2011 02:48 PM  Raul Del, [id number]

## 2011-08-13 NOTE — Telephone Encounter (Signed)
Pt given instructions for CT scan for 08/17/11 at 10am. Pt stated understanding.

## 2011-08-13 NOTE — Progress Notes (Signed)
Patient did not experience any of the following events: a burn prior to discharge; a fall within the facility; wrong site/side/patient/procedure/implant event; or a hospital transfer or hospital admission upon discharge from the facility. (G8907) Patient did not have preoperative order for IV antibiotic SSI prophylaxis. (G8918)  

## 2011-08-13 NOTE — Patient Instructions (Addendum)
FOLLOW DISCHARGE INSTRUCTIONS (BLUE AND GREEN SHEETS).. DR. PYRTLE'S OFFICE WILL SCHEDULE A CAT SCAN OF CHEST, PELVIS, AND ABDOMEN.  THE OFFICE WILL CONTACT YOU WITH DATES, TIMES, AND INSTRUCTIONS FOR  PREPARATION. CYNTHIA BRADLEY GAVE PT. CT SCAN INSTRUCTION, DATE, TIME AND CONTRAST FOR CT SCAN.  LABS ORDERED FOR PRE CT AND PT. TAKEN TO LAB VIA W/C BY SHEILA SMITH PRIOR TO DISCHARGE.

## 2011-08-14 ENCOUNTER — Telehealth: Payer: Self-pay | Admitting: *Deleted

## 2011-08-14 NOTE — Telephone Encounter (Signed)
  Follow up Call-  Call back number 08/13/2011  Post procedure Call Back phone  # [phone]  Permission to leave phone message Yes     Patient questions:  Do you have a fever, pain , or abdominal swelling? no Pain Score  0 *  Have you tolerated food without any problems? yes  Have you been able to return to your normal activities? yes  Do you have any questions about your discharge instructions: Diet   no Medications  no Follow up visit  no  Do you have questions or concerns about your Care? no  Actions: * If pain score is 4 or above: No action needed, pain <4.

## 2011-08-17 ENCOUNTER — Telehealth: Payer: Self-pay | Admitting: *Deleted

## 2011-08-17 ENCOUNTER — Ambulatory Visit (INDEPENDENT_AMBULATORY_CARE_PROVIDER_SITE_OTHER)
Admission: RE | Admit: 2011-08-17 | Discharge: 2011-08-17 | Disposition: A | Discharge status: Home / Self Care | Payer: Medicare Other | Source: Ambulatory Visit | Attending: Internal Medicine | Admitting: Internal Medicine

## 2011-08-17 DIAGNOSIS — R634 Abnormal weight loss: Secondary | ICD-10-CM

## 2011-08-17 DIAGNOSIS — R1032 Left lower quadrant pain: Secondary | ICD-10-CM

## 2011-08-17 MED ORDER — IOHEXOL 300 MG/ML  SOLN
100.0000 mL | Freq: Once | INTRAMUSCULAR | Status: AC | PRN
  Administered 2011-08-17: 100 mL via INTRAVENOUS

## 2011-08-17 NOTE — Telephone Encounter (Signed)
Message copied by Florene Glen on Fri Aug 17, 2011  4:22 PM ------      Message from: Beverley Fiedler      Created: Fri Aug 17, 2011  3:51 PM      Regarding: FW: Colon       This is the note from Tsuei      We can send her back to him.      Thanks.      ----- Message -----         From: Wilmon Arms. Corliss Skains, MD         Sent: 08/13/2011   4:29 PM           To: Erick Blinks, MD      Subject: RE: Colon                                                Vonna Kotyk            Feel free to call me Susy Frizzle            Thanks for the report.  Please send her back to see me after the CT scan.  If the scan looks OK, I'll post her for the hernia repair.      ----- Message -----         From: Erick Blinks, MD         Sent: 08/13/2011   2:48 PM           To: Wilmon Arms. Corliss Skains, MD      Subject: Colon                                                    Dr. Corliss Skains      Just letting you know I performed colonoscopy today on Mrs. Giangregorio which revealed diverticulosis.  No polyps/tumors.  You saw her fairly recently and were considering hernia repair.  I am planning CT c/a/p given her unexplained weight loss.      Thanks for the referral.      Erick Blinks      [facility]

## 2011-08-17 NOTE — Telephone Encounter (Signed)
Informed pt she is to f/u with Dr Harlon Flor; pt stated understanding.

## 2011-08-20 ENCOUNTER — Telehealth: Payer: Self-pay | Admitting: *Deleted

## 2011-08-20 NOTE — Telephone Encounter (Signed)
Message copied by Florene Glen on Mon Aug 20, 2011  4:26 PM ------      Message from: Beverley Fiedler      Created: Fri Aug 17, 2011  3:47 PM       Imaging studies review      CT chest reveals emphysema likely is a result of years of tobacco use .  Also a pulmonary nodule was seen, the nature of which is uncertain.      She needs a repeat CT scan of her chest in 3 months to assess the stability of this nodule.      I also recommend [place] pulmonology referral regarding emphysema (also lung nodule)            CT of the abdomen reveals nothing acute.  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      Her bile duct was slightly large, but her LFTs were recently checked and normal, therefore I feel nothing is necessary regarding this at this time.            She should be referred back to Dr. Harlon Flor, who saw her previously, for inguinal hernia repair.            Thanks

## 2011-08-20 NOTE — Telephone Encounter (Signed)
Informed pt of Dr Lauro Franklin suggestion that she see a Pulmonologist for the node that showed up on the CT scan. Pt reports she wants her hernia fixed first. Informed her Dr Harlon Flor may choose otherwise, but I will put a reminder in  To call her after her 09/06/11 visit with Dr Harlon Flor; pt stated understanding.

## 2011-09-06 ENCOUNTER — Encounter (INDEPENDENT_AMBULATORY_CARE_PROVIDER_SITE_OTHER): Payer: Self-pay | Admitting: Surgery

## 2011-09-06 ENCOUNTER — Ambulatory Visit (INDEPENDENT_AMBULATORY_CARE_PROVIDER_SITE_OTHER): Payer: Medicare Other | Admitting: Surgery

## 2011-09-06 VITALS — BP 156/90 | HR 76 | Temp 97.9°F | Resp 18 | Ht 66.0 in | Wt 129.4 lb

## 2011-09-06 DIAGNOSIS — K4090 Unilateral inguinal hernia, without obstruction or gangrene, not specified as recurrent: Secondary | ICD-10-CM

## 2011-09-06 NOTE — Progress Notes (Signed)
New note  The colonoscopy showed only some diverticulosis and internal hemorrhoids, but no significant issues.  Dr. Rhea Belton obtained a CT of the chest, abd, pelvis which showed some COPD changes, but no other worrisome findings.  She presents to discuss her [facility] repair. The left groin remains painful, but the bulge has not enlarged.  Left inguinal hernia repair with mesh. The surgical procedure has been discussed with the patient.  Potential risks, benefits, alternative treatments, and expected outcomes have been explained.  All of the patient's questions at this time have been answered.  The likelihood of reaching the patient's treatment goal is good.  The patient understand the proposed surgical procedure and wishes to proceed.  She is also scheduled for her first screening mammogram.   Wilmon Arms. Corliss Skains, MD, [facility]  09/06/2011 10:19 AM  Old note  Patient ID: Caroline Hill, female   DOB: [birth year], 66 y.o.   MRN: [id number]  Chief Complaint  Patient presents with  . Other    new pt- eval hernia    HPI Caroline Hill is a 66 y.o. female.  Referred by Dr. Tomi Bamberger HPI 66 yo female presents with a known left inguinal hernia for at least 8 years.  It has enlarged significantly and is causing some discomfort. She has developed a new problem with constipation over the last 6 months.  The patient has never had any colon cancer screening.  She also reports never having a mammogram.  She denies blood in her bowel movements.  Her hernia hurts more when she is trying to have a bowel movement. Past Medical History  Diagnosis Date  . Hypertension   . Bipolar disorder, unspecified   . Unspecified constipation   . Other malaise and fatigue   . Unspecified vitamin D deficiency   . Tobacco use disorder   . Neuromuscular disorder   . Hyperlipidemia   . Arthritis     Past Surgical History  Procedure Date  . Hand surgery   . Appendectomy     Family History    Problem Relation Age of Onset  . Heart disease Mother     Social History History  Substance Use Topics  . Smoking status: Current Everyday Smoker -- 0.5 packs/day  . Smokeless tobacco: Not on file  . Alcohol Use: No    No Known Allergies  Current Outpatient Prescriptions  Medication Sig Dispense Refill  . amLODipine-benazepril (LOTREL) 10-20 MG per capsule Take 1 capsule by mouth daily.        Marland Kitchen amphetamine-dextroamphetamine (ADDERALL) 15 MG tablet Take 15 mg by mouth daily.        . clonazePAM (KLONOPIN) 0.5 MG tablet Take 0.25 mg by mouth 2 (two) times daily as needed.        Marland Kitchen FLUoxetine (PROZAC) 20 MG tablet Take 20 mg by mouth daily.        Marland Kitchen lamoTRIgine (LAMICTAL) 100 MG tablet Take 100 mg by mouth daily.        . paliperidone (INVEGA) 9 MG 24 hr tablet Take 9 mg by mouth every morning.        . traZODone (DESYREL) 100 MG tablet Take 100 mg by mouth at bedtime.          Review of Systems Review of Systems  Constitutional: Negative for fever, chills and unexpected weight change.  HENT: Negative for hearing loss, congestion, sore throat, trouble swallowing and voice change.   Eyes: Negative for visual disturbance.  Respiratory:  Negative for cough and wheezing.   Cardiovascular: Negative for chest pain, palpitations and leg swelling.  Gastrointestinal: Positive for abdominal pain and constipation. Negative for nausea, vomiting, diarrhea, blood in stool, abdominal distention and anal bleeding.  Genitourinary: Negative for hematuria, vaginal bleeding and difficulty urinating.  Musculoskeletal: Negative for arthralgias.  Skin: Negative for rash and wound.  Neurological: Negative for seizures, syncope and headaches.  Hematological: Negative for adenopathy. Does not bruise/bleed easily.  Psychiatric/Behavioral: Negative for confusion.    Blood pressure 132/88, pulse 96, temperature 98.5 F (36.9 C), temperature source Temporal, resp. rate 20, height 5\' 6"  (1.676 m), weight  125 lb 12.8 oz (57.063 kg).  Physical Exam Physical Exam WDWN in NAD HEENT:  EOMI, sclera anicteric Neck:  No masses, no thyromegaly Lungs:  CTA bilaterally; normal respiratory effort CV:  Regular rate and rhythm; no murmurs Abd:  +bowel sounds, soft, non-tender, no masses GU:  Palpable mass in left groin, reducible; enlarges with Valsalva maneuver.  No sign of right inguinal hernia Ext:  Well-perfused; no edema Skin:  Warm, dry; no sign of jaundice  Data Reviewed None  Assessment    Left inguinal hernia No previous colon cancer screening     Plan    Prior to hernia repair, I believe that the prudent approach would be to first have a screening colonoscopy to rule out other etiologies for her constipation.  Certainly, the hernia could be a reason for her constipation.  If the colonoscopy is negative, then we will proceed with hernia repair with mesh.    She also needs to have a screening mammogram.       Staysha Truby K. 07/31/2011, 10:33 AM

## 2011-09-14 HISTORY — PX: TRANSTHORACIC ECHOCARDIOGRAM: SHX275

## 2011-09-14 HISTORY — PX: NM MYOCAR PERF WALL MOTION: HXRAD629

## 2011-09-18 ENCOUNTER — Encounter (INDEPENDENT_AMBULATORY_CARE_PROVIDER_SITE_OTHER): Payer: Self-pay | Admitting: General Surgery

## 2011-10-23 ENCOUNTER — Telehealth (INDEPENDENT_AMBULATORY_CARE_PROVIDER_SITE_OTHER): Payer: Self-pay | Admitting: Surgery

## 2011-10-23 NOTE — Telephone Encounter (Signed)
I told Caroline Hill once I get the clearance from Dr Alanda Amass we can get her surgery schedule

## 2011-10-31 NOTE — Pre-Procedure Instructions (Signed)
PREOP APPOINTMENT ARRANGED WITH PT FOR WED 11/07/11 AT 8:30 AM [facility]--SURGERY IS PLANNED FOR 5/3.  PT STATES SHE IS AWARE HER CT CHEST REPORT 08/17/11 ABNORMAL-  STATES SHE WAS TOLD THERE IS A SHADOW.  PT PLANS TO ASK DR. Corliss Skains IF SHE NEEDS ANY FOLLOW UP--STATES SHE NO LONGER HAS A MEDICAL DOCTOR--HER DOCTOR LEFT PRACTICE.

## 2011-11-07 ENCOUNTER — Encounter (HOSPITAL_COMMUNITY): Payer: Self-pay

## 2011-11-07 ENCOUNTER — Encounter (HOSPITAL_COMMUNITY)
Admission: RE | Admit: 2011-11-07 | Discharge: 2011-11-07 | Disposition: A | Discharge status: Home / Self Care | Payer: Medicare Other | Source: Ambulatory Visit | Attending: Surgery | Admitting: Surgery

## 2011-11-07 ENCOUNTER — Encounter (HOSPITAL_COMMUNITY): Payer: Self-pay | Admitting: Pharmacy Technician

## 2011-11-07 HISTORY — DX: Major depressive disorder, single episode, unspecified: F32.9

## 2011-11-07 HISTORY — DX: Depression, unspecified: F32.A

## 2011-11-07 HISTORY — DX: Shortness of breath: R06.02

## 2011-11-07 LAB — COMPREHENSIVE METABOLIC PANEL
ALT: 17 U/L (ref 0–35)
AST: 18 U/L (ref 0–37)
Calcium: 9.5 mg/dL (ref 8.4–10.5)
Chloride: 99 mEq/L (ref 96–112)
Potassium: 3.8 mEq/L (ref 3.5–5.1)
Total Protein: 7 g/dL (ref 6.0–8.3)

## 2011-11-07 LAB — CBC
HCT: 42.6 % (ref 36.0–46.0)
Hemoglobin: 14.2 g/dL (ref 12.0–15.0)
MCV: 92 fL (ref 78.0–100.0)
RBC: 4.63 MIL/uL (ref 3.87–5.11)
WBC: 8.6 10*3/uL (ref 4.0–10.5)

## 2011-11-07 LAB — SURGICAL PCR SCREEN: Staphylococcus aureus: NEGATIVE

## 2011-11-07 NOTE — Pre-Procedure Instructions (Signed)
11/07/11 Pt states she is unable to take medications on an empty stomach.  Pt to take Clonazepam  And Toprol am of surgery as printed on instructions.

## 2011-11-07 NOTE — Pre-Procedure Instructions (Signed)
10/22/11 EKG on chart  CT chest with contrast 08/17/11 on chart  10/22/11 LOV note from cardiology ( Dr Alanda Amass) on chart  ECHO 10/10/11 on chart  Stress Test 10/10/11 on chart

## 2011-11-07 NOTE — Patient Instructions (Signed)
20 Caroline Hill  11/07/2011   Your procedure is scheduled on:  11/16/11 1030am-1130am  Report to [facility] at 0800 AM.  Call this number if you have problems the morning of surgery: [phone]   Remember:   Do not eat food:After Midnight.  May have clear liquids:until Midnight .    Take these medicines the morning of surgery with A SIP OF WATER:    Do not wear jewelry, make-up or nail polish.  Do not wear lotions, powders, or perfumes.   Do not shave 48 hours prior to surgery.  Do not bring valuables to the hospital.  Contacts, dentures or bridgework may not be worn into surgery.  .   Patients discharged the day of surgery will not be allowed to drive home.  Name and phone number of your driver:  Special Instructions: CHG Shower Use Special Wash: 1/2 bottle night before surgery and 1/2 bottle morning of surgery. shower chin to toes with CHG.  Wash face and private parts with regular soap.    Please read over the following fact sheets that you were given: MRSA Information, coughing and deep breathing exercises, leg exercises

## 2011-11-13 NOTE — Pre-Procedure Instructions (Signed)
11/13/11 PT made aware of time change of surgery from 1030-1130 until 0915-1015.  Pt aware and knows to arrive at 0715 am.

## 2011-11-16 ENCOUNTER — Encounter (HOSPITAL_COMMUNITY): Payer: Self-pay | Admitting: Anesthesiology

## 2011-11-16 ENCOUNTER — Ambulatory Visit (HOSPITAL_COMMUNITY): Payer: Medicare Other | Admitting: Anesthesiology

## 2011-11-16 ENCOUNTER — Ambulatory Visit (HOSPITAL_COMMUNITY)
Admission: RE | Admit: 2011-11-16 | Discharge: 2011-11-16 | Disposition: A | Discharge status: Home / Self Care | Payer: Medicare Other | Source: Ambulatory Visit | Attending: Surgery | Admitting: Surgery

## 2011-11-16 ENCOUNTER — Encounter (HOSPITAL_COMMUNITY)
Admission: RE | Disposition: A | Discharge status: Home / Self Care | Payer: Self-pay | Source: Ambulatory Visit | Attending: Surgery

## 2011-11-16 ENCOUNTER — Encounter (HOSPITAL_COMMUNITY): Payer: Self-pay | Admitting: *Deleted

## 2011-11-16 DIAGNOSIS — Z79899 Other long term (current) drug therapy: Secondary | ICD-10-CM | POA: Insufficient documentation

## 2011-11-16 DIAGNOSIS — Z01812 Encounter for preprocedural laboratory examination: Secondary | ICD-10-CM | POA: Insufficient documentation

## 2011-11-16 DIAGNOSIS — K4090 Unilateral inguinal hernia, without obstruction or gangrene, not specified as recurrent: Secondary | ICD-10-CM

## 2011-11-16 DIAGNOSIS — E785 Hyperlipidemia, unspecified: Secondary | ICD-10-CM | POA: Insufficient documentation

## 2011-11-16 DIAGNOSIS — I10 Essential (primary) hypertension: Secondary | ICD-10-CM | POA: Insufficient documentation

## 2011-11-16 HISTORY — PX: INGUINAL HERNIA REPAIR: SHX194

## 2011-11-16 SURGERY — REPAIR, HERNIA, INGUINAL, ADULT
Anesthesia: General | Laterality: Left | Wound class: Clean

## 2011-11-16 MED ORDER — ONDANSETRON HCL 4 MG/2ML IJ SOLN: 4.0000 mg | INTRAMUSCULAR | Status: DC | PRN

## 2011-11-16 MED ORDER — LACTATED RINGERS IV SOLN: INTRAVENOUS | Status: DC

## 2011-11-16 MED ORDER — LIDOCAINE HCL 1 % IJ SOLN
INTRAMUSCULAR | Status: DC | PRN
  Administered 2011-11-16: 50 mg via INTRADERMAL

## 2011-11-16 MED ORDER — OXYCODONE-ACETAMINOPHEN 5-325 MG PO TABS
ORAL_TABLET | ORAL | Status: AC
  Filled 2011-11-16: qty 1

## 2011-11-16 MED ORDER — OXYCODONE-ACETAMINOPHEN 5-325 MG PO TABS
1.0000 | ORAL_TABLET | ORAL | Status: DC | PRN
  Administered 2011-11-16: 1 via ORAL

## 2011-11-16 MED ORDER — LACTATED RINGERS IV SOLN
INTRAVENOUS | Status: DC
  Administered 2011-11-16: 1000 mL via INTRAVENOUS

## 2011-11-16 MED ORDER — CEFAZOLIN SODIUM 1-5 GM-% IV SOLN
1.0000 g | INTRAVENOUS | Status: AC
  Administered 2011-11-16: 1 g via INTRAVENOUS

## 2011-11-16 MED ORDER — CEFAZOLIN SODIUM 1-5 GM-% IV SOLN
INTRAVENOUS | Status: AC
  Filled 2011-11-16: qty 50

## 2011-11-16 MED ORDER — ONDANSETRON HCL 4 MG/2ML IJ SOLN
INTRAMUSCULAR | Status: DC | PRN
  Administered 2011-11-16: 4 mg via INTRAVENOUS

## 2011-11-16 MED ORDER — BUPIVACAINE-EPINEPHRINE PF 0.25-1:200000 % IJ SOLN
INTRAMUSCULAR | Status: DC | PRN
  Administered 2011-11-16: 20 mL

## 2011-11-16 MED ORDER — ACETAMINOPHEN 10 MG/ML IV SOLN
INTRAVENOUS | Status: DC | PRN
  Administered 2011-11-16: 1000 mg via INTRAVENOUS

## 2011-11-16 MED ORDER — MORPHINE SULFATE 10 MG/ML IJ SOLN: 2.0000 mg | INTRAMUSCULAR | Status: DC | PRN

## 2011-11-16 MED ORDER — ACETAMINOPHEN 10 MG/ML IV SOLN
INTRAVENOUS | Status: AC
  Filled 2011-11-16: qty 100

## 2011-11-16 MED ORDER — BUPIVACAINE-EPINEPHRINE PF 0.25-1:200000 % IJ SOLN
INTRAMUSCULAR | Status: AC
  Filled 2011-11-16: qty 30

## 2011-11-16 MED ORDER — PROPOFOL 10 MG/ML IV EMUL
INTRAVENOUS | Status: DC | PRN
  Administered 2011-11-16: 120 mg via INTRAVENOUS
  Administered 2011-11-16: 50 mg via INTRAVENOUS

## 2011-11-16 MED ORDER — EPHEDRINE SULFATE 50 MG/ML IJ SOLN
INTRAMUSCULAR | Status: DC | PRN
  Administered 2011-11-16 (×3): 5 mg via INTRAVENOUS

## 2011-11-16 MED ORDER — ALBUTEROL SULFATE (5 MG/ML) 0.5% IN NEBU
INHALATION_SOLUTION | RESPIRATORY_TRACT | Status: AC
  Filled 2011-11-16: qty 0.5

## 2011-11-16 MED ORDER — FENTANYL CITRATE 0.05 MG/ML IJ SOLN
INTRAMUSCULAR | Status: DC | PRN
  Administered 2011-11-16 (×2): 50 ug via INTRAVENOUS

## 2011-11-16 MED ORDER — 0.9 % SODIUM CHLORIDE (POUR BTL) OPTIME
TOPICAL | Status: DC | PRN
  Administered 2011-11-16: 1000 mL

## 2011-11-16 MED ORDER — FENTANYL CITRATE 0.05 MG/ML IJ SOLN
INTRAMUSCULAR | Status: AC
  Filled 2011-11-16: qty 2

## 2011-11-16 MED ORDER — PROMETHAZINE HCL 25 MG/ML IJ SOLN: 6.2500 mg | INTRAMUSCULAR | Status: DC | PRN

## 2011-11-16 MED ORDER — OXYCODONE-ACETAMINOPHEN 5-325 MG PO TABS: 1.0000 | ORAL_TABLET | ORAL | Status: AC | PRN

## 2011-11-16 MED ORDER — FENTANYL CITRATE 0.05 MG/ML IJ SOLN
25.0000 ug | INTRAMUSCULAR | Status: DC | PRN
  Administered 2011-11-16 (×2): 50 ug via INTRAVENOUS

## 2011-11-16 MED ORDER — ALBUTEROL SULFATE (5 MG/ML) 0.5% IN NEBU
2.5000 mg | INHALATION_SOLUTION | Freq: Once | RESPIRATORY_TRACT | Status: AC
  Administered 2011-11-16: 2.5 mg via RESPIRATORY_TRACT

## 2011-11-16 SURGICAL SUPPLY — 45 items
APL SKNCLS STERI-STRIP NONHPOA (GAUZE/BANDAGES/DRESSINGS) ×1
BENZOIN TINCTURE PRP APPL 2/3 (GAUZE/BANDAGES/DRESSINGS) ×2 IMPLANT
BLADE HEX COATED 2.75 (ELECTRODE) ×2 IMPLANT
CHLORAPREP W/TINT 26ML (MISCELLANEOUS) ×2 IMPLANT
CLOTH BEACON ORANGE TIMEOUT ST (SAFETY) ×2 IMPLANT
COVER SURGICAL LIGHT HANDLE (MISCELLANEOUS) ×2 IMPLANT
DECANTER SPIKE VIAL GLASS SM (MISCELLANEOUS) ×2 IMPLANT
DISSECTOR ROUND CHERRY 3/8 STR (MISCELLANEOUS) ×2 IMPLANT
DRAIN PENROSE 18X1/2 LTX STRL (DRAIN) IMPLANT
DRAPE LAPAROTOMY TRNSV 102X78 (DRAPE) ×2 IMPLANT
DRAPE UTILITY XL STRL (DRAPES) ×2 IMPLANT
DRSG TEGADERM 4X4.75 (GAUZE/BANDAGES/DRESSINGS) ×2 IMPLANT
ELECT REM PT RETURN 9FT ADLT (ELECTROSURGICAL) ×2
ELECTRODE REM PT RTRN 9FT ADLT (ELECTROSURGICAL) ×1 IMPLANT
GAUZE SPONGE 4X4 16PLY XRAY LF (GAUZE/BANDAGES/DRESSINGS) ×1 IMPLANT
GLOVE BIO SURGEON STRL SZ7 (GLOVE) ×2 IMPLANT
GLOVE BIOGEL PI IND STRL 7.5 (GLOVE) ×1 IMPLANT
GLOVE BIOGEL PI INDICATOR 7.5 (GLOVE) ×1
GOWN STRL NON-REIN LRG LVL3 (GOWN DISPOSABLE) ×4 IMPLANT
GOWN STRL REIN XL XLG (GOWN DISPOSABLE) ×2 IMPLANT
KIT BASIN OR (CUSTOM PROCEDURE TRAY) ×2 IMPLANT
MESH ULTRAPRO 3X6 7.6X15CM (Mesh General) ×1 IMPLANT
NDL HYPO 25X1 1.5 SAFETY (NEEDLE) ×1 IMPLANT
NEEDLE HYPO 25X1 1.5 SAFETY (NEEDLE) ×2 IMPLANT
NS IRRIG 1000ML POUR BTL (IV SOLUTION) ×2 IMPLANT
PACK GENERAL/GYN (CUSTOM PROCEDURE TRAY) ×2 IMPLANT
SPONGE GAUZE 4X4 12PLY (GAUZE/BANDAGES/DRESSINGS) ×2 IMPLANT
STRIP CLOSURE SKIN 1/2X4 (GAUZE/BANDAGES/DRESSINGS) ×2 IMPLANT
SUT MNCRL AB 4-0 PS2 18 (SUTURE) ×2 IMPLANT
SUT PROLENE 2 0 SH DA (SUTURE) ×3 IMPLANT
SUT SILK 2 0 (SUTURE) ×2
SUT SILK 2 0 SH (SUTURE) IMPLANT
SUT SILK 2-0 18XBRD TIE 12 (SUTURE) IMPLANT
SUT SILK 3 0 (SUTURE) ×2
SUT SILK 3-0 18XBRD TIE 12 (SUTURE) IMPLANT
SUT VIC AB 2-0 CT2 27 (SUTURE) IMPLANT
SUT VIC AB 2-0 SH 27 (SUTURE) ×2
SUT VIC AB 2-0 SH 27X BRD (SUTURE) ×1 IMPLANT
SUT VIC AB 3-0 SH 27 (SUTURE) ×2
SUT VIC AB 3-0 SH 27X BRD (SUTURE) IMPLANT
SUT VIC AB 3-0 SH 27XBRD (SUTURE) IMPLANT
SUT VICRYL 0 27 CT2 27 ABS (SUTURE) ×1 IMPLANT
SUT VICRYL 0 UR6 27IN ABS (SUTURE) IMPLANT
SYR CONTROL 10ML LL (SYRINGE) ×2 IMPLANT
TOWEL OR 17X26 10 PK STRL BLUE (TOWEL DISPOSABLE) ×4 IMPLANT

## 2011-11-16 NOTE — Transfer of Care (Signed)
Immediate Anesthesia Transfer of Care Note  Patient: Caroline Hill  Procedure(s) Performed: Procedure(s) (LRB): HERNIA REPAIR INGUINAL ADULT (Left) INSERTION OF MESH (Left)  Patient Location: PACU  Anesthesia Type: General  Level of Consciousness: awake, alert , patient cooperative and responds to stimulation  Airway & Oxygen Therapy: Patient Spontanous Breathing and Patient connected to face mask oxygen  Post-op Assessment: Report given to PACU RN, Post -op Vital signs reviewed and stable and Patient moving all extremities X 4  Post vital signs: Reviewed and stable  Complications: No apparent anesthesia complications

## 2011-11-16 NOTE — Progress Notes (Signed)
Patient encouraged to cough and deep breath. Ice bag given to patient for home use

## 2011-11-16 NOTE — Preoperative (Signed)
Beta Blockers   Reason not to administer Beta Blockers:Not Applicable 

## 2011-11-16 NOTE — Discharge Instructions (Signed)
[facility] ° ° INGUINAL HERNIA REPAIR: POST OP INSTRUCTIONS ° °Always review your discharge instruction sheet given to you by the facility where your surgery was performed. °IF YOU HAVE DISABILITY OR FAMILY LEAVE FORMS, YOU MUST BRING THEM TO THE OFFICE FOR PROCESSING.   °DO NOT GIVE THEM TO YOUR DOCTOR. ° °1. A  prescription for pain medication may be given to you upon discharge.  Take your pain medication as prescribed, if needed.  If narcotic pain medicine is not needed, then you may take acetaminophen (Tylenol) or ibuprofen (Advil) as needed. °2. Take your usually prescribed medications unless otherwise directed. °3. If you need a refill on your pain medication, please contact your pharmacy.  They will contact our office to request authorization. Prescriptions will not be filled after 5 pm or on week-ends. °4. You should follow a light diet the first 24 hours after arrival home, such as soup and crackers, etc.  Be sure to include lots of fluids daily.  Resume your normal diet the day after surgery. °5. Most patients will experience some swelling and bruising in the groin.  Ice packs and reclining will help.  Swelling and bruising can take several days to resolve.  °6. It is common to experience some constipation if taking pain medication after surgery.  Increasing fluid intake and taking a stool softener (such as Colace) will usually help or prevent this problem from occurring.  A mild laxative (Milk of Magnesia or Miralax) should be taken according to package directions if there are no bowel movements after 48 hours. °7. Unless discharge instructions indicate otherwise, you may remove your bandages 24-48 hours after surgery, and you may shower at that time.  You will have steri-strips (small skin tapes) in place directly over the incision.  These strips should be left on the skin for 7-10 days. °8. ACTIVITIES:  You may resume regular (light) daily activities beginning the next day--such as  daily self-care, walking, climbing stairs--gradually increasing activities as tolerated.  You may have sexual intercourse when it is comfortable.  Refrain from any heavy lifting or straining until approved by your doctor. °a. You may drive when you are no longer taking prescription pain medication, you can comfortably wear a seatbelt, and you can safely maneuver your car and apply brakes. °b. RETURN TO WORK:  2-3 weeks with light duty - no lifting over 15 lbs. °9. You should see your doctor in the office for a follow-up appointment approximately 2-3 weeks after your surgery.  Make sure that you call for this appointment within a day or two after you arrive home to insure a convenient appointment time. °10. OTHER INSTRUCTIONS:  __________________________________________________________________________________________________________________________________________________________________________________________  °WHEN TO CALL YOUR DOCTOR: °1. Fever over 101.0 °2. Inability to urinate °3. Nausea and/or vomiting °4. Extreme swelling or bruising °5. Continued bleeding from incision. °6. Increased pain, redness, or drainage from the incision ° °The clinic staff is available to answer your questions during regular business hours.  Please don’t hesitate to call and ask to speak to one of the nurses for clinical concerns.  If you have a medical emergency, go to the nearest emergency room or call 911.  A surgeon from [facility] is always on call at the hospital ° ° °[address], [address], [place], [place]  [address] ? ° [address] [address], [place], [place]   [address] °[phone]    [phone]    FAX [phone] °Web site: [email or web address] ° ° °

## 2011-11-16 NOTE — Op Note (Signed)
Hernia, Open, Procedure Note  Indications: The patient presented with a history of a left, reducible inguinal hernia.    Pre-operative Diagnosis: left reducible inguinal hernia Post-operative Diagnosis: same  Surgeon: Wynona Luna.   Assistants: none  Anesthesia: General LMA anesthesia  ASA Class: 2  Procedure Details  The patient was seen again in the Holding Room. The risks, benefits, complications, treatment options, and expected outcomes were discussed with the patient. The possibilities of reaction to medication, pulmonary aspiration, perforation of viscus, bleeding, recurrent infection, the need for additional procedures, and development of a complication requiring transfusion or further operation were discussed with the patient and/or family. The likelihood of success in repairing the hernia and returning the patient to their previous functional status is good.  There was concurrence with the proposed plan, and informed consent was obtained. The site of surgery was properly noted/marked. The patient was taken to the Operating Room, identified as Caroline Hill, and the procedure verified as left inguinal hernia repair. A Time Out was held and the above information confirmed.  The patient was placed in the supine position and underwent induction of anesthesia. The lower abdomen and groin was prepped with Chloraprep and draped in the standard fashion, and 0.5% Marcaine with epinephrine was used to anesthetize the skin over the mid-portion of the inguinal canal. An oblique incision was made. Dissection was carried down through the subcutaneous tissue with cautery to the external oblique fascia.  The external oblique fascia had a 3 cm long split along the inguinal canal.  We opened the external oblique fascia along the direction of its fibers to the external ring.    The floor of the inguinal canal was inspected and showed a direct defect. We dissected around the round ligament and divided  this between 2-0 silk ligatures.  The direct defect was closed with a 0 Vicryl suture.  We used a 3 x 6 inch piece of Ultrapro mesh, which was cut into an oval shape.  This was secured with 2-0 Prolene, beginning at the pubic tubercle, running this along the internal oblique fascia superiorly and the shelving edge inferiorly.    The mesh was tucked underneath the external oblique fascia laterally.  The external oblique fascia was reapproximated with 2-0 Vicryl.  3-0 Vicryl was used to close the subcutaneous tissues and 4-0 Monocryl was used to close the skin in subcuticular fashion.  Benzoin and steri-strips were used to seal the incision.  A clean dressing was applied.  The patient was then extubated and brought to the recovery room in stable condition.  All sponge, instrument, and needle counts were correct prior to closure and at the conclusion of the case.   Estimated Blood Loss: Minimal                 Complications: None; patient tolerated the procedure well.         Disposition: PACU - hemodynamically stable.         Condition: stable  Wilmon Arms. Corliss Skains, MD, [facility]  11/16/2011 10:34 AM

## 2011-11-16 NOTE — Anesthesia Preprocedure Evaluation (Addendum)
Anesthesia Evaluation  Patient identified by MRN, date of birth, ID band Patient awake    Reviewed: Allergy & Precautions, H&P , NPO status , Patient's Chart, lab work & pertinent test results  History of Anesthesia Complications Negative for: history of anesthetic complications  Airway Mallampati: II TM Distance: >3 FB Neck ROM: Full    Dental  (+) Edentulous Upper, Poor Dentition, Loose and Partial Lower,    Pulmonary neg pulmonary ROS, shortness of breath and with exertion, COPD + rhonchi         Cardiovascular hypertension, Pt. on medications and Pt. on home beta blockers - angina+ CAD and + Cardiac Stents Rhythm:Regular Rate:Normal     Neuro/Psych  Headaches, PSYCHIATRIC DISORDERS Depression  Neuromuscular disease    GI/Hepatic negative GI ROS, (+)     substance abuse  marijuana use,   Endo/Other  negative endocrine ROS  Renal/GU negative Renal ROS  negative genitourinary   Musculoskeletal negative musculoskeletal ROS (+)   Abdominal   Peds negative pediatric ROS (+)  Hematology negative hematology ROS (+)   Anesthesia Other Findings   Reproductive/Obstetrics negative OB ROS                           Anesthesia Physical Anesthesia Plan  ASA: III  Anesthesia Plan: General   Post-op Pain Management:    Induction: Intravenous  Airway Management Planned: LMA  Additional Equipment:   Intra-op Plan:   Post-operative Plan: Extubation in OR  Informed Consent: I have reviewed the patients History and Physical, chart, labs and discussed the procedure including the risks, benefits and alternatives for the proposed anesthesia with the patient or authorized representative who has indicated his/her understanding and acceptance.   Dental advisory given  Plan Discussed with: CRNA  Anesthesia Plan Comments:         Anesthesia Quick Evaluation

## 2011-11-16 NOTE — H&P (Signed)
Patient ID: Caroline Hill, female   DOB: 1946/04/12, 66 y.o.   MRN: [id number]    Chief Complaint   Patient presents with   .  Other       new pt- eval hernia        HPI Caroline Hill is a 66 y.o. female.  Referred by Dr. Tomi Bamberger HPI 66 yo female presents with a known left inguinal hernia for at least 8 years.  It has enlarged significantly and is causing some discomfort. She has developed a new problem with constipation over the last 6 months.  The patient has never had any colon cancer screening.  She also reports never having a mammogram.  She denies blood in her bowel movements.  Her hernia hurts more when she is trying to have a bowel movement. Past Medical History   Diagnosis  Date   .  Hypertension     .  Bipolar disorder, unspecified     .  Unspecified constipation     .  Other malaise and fatigue     .  Unspecified vitamin D deficiency     .  Tobacco use disorder     .  Neuromuscular disorder     .  Hyperlipidemia     .  Arthritis           Past Surgical History   Procedure  Date   .  Hand surgery     .  Appendectomy           Family History   Problem  Relation  Age of Onset   .  Heart disease  Mother          Social History History   Substance Use Topics   .  Smoking status:  Current Everyday Smoker -- 0.5 packs/day   .  Smokeless tobacco:  Not on file   .  Alcohol Use:  No        No Known Allergies    Current Outpatient Prescriptions   Medication  Sig  Dispense  Refill   .  amLODipine-benazepril (LOTREL) 10-20 MG per capsule  Take 1 capsule by mouth daily.           Marland Kitchen  amphetamine-dextroamphetamine (ADDERALL) 15 MG tablet  Take 15 mg by mouth daily.           .  clonazePAM (KLONOPIN) 0.5 MG tablet  Take 0.25 mg by mouth 2 (two) times daily as needed.           Marland Kitchen  FLUoxetine (PROZAC) 20 MG tablet  Take 20 mg by mouth daily.           Marland Kitchen  lamoTRIgine (LAMICTAL) 100 MG tablet  Take 100 mg by mouth daily.           .  paliperidone  (INVEGA) 9 MG 24 hr tablet  Take 9 mg by mouth every morning.          .  traZODone (DESYREL) 100 MG tablet  Take 100 mg by mouth at bedtime.                Review of Systems Review of Systems  Constitutional: Negative for fever, chills and unexpected weight change.  HENT: Negative for hearing loss, congestion, sore throat, trouble swallowing and voice change.   Eyes: Negative for visual disturbance.  Respiratory: Negative for cough and wheezing.   Cardiovascular: Negative for chest pain, palpitations and leg swelling.  Gastrointestinal: Positive  for abdominal pain and constipation. Negative for nausea, vomiting, diarrhea, blood in stool, abdominal distention and anal bleeding.  Genitourinary: Negative for hematuria, vaginal bleeding and difficulty urinating.  Musculoskeletal: Negative for arthralgias.  Skin: Negative for rash and wound.  Neurological: Negative for seizures, syncope and headaches.  Hematological: Negative for adenopathy. Does not bruise/bleed easily.  Psychiatric/Behavioral: Negative for confusion.      Blood pressure 132/88, pulse 96, temperature 98.5 F (36.9 C), temperature source Temporal, resp. rate 20, height 5\' 6"  (1.676 m), weight 125 lb 12.8 oz (57.063 kg).   Physical Exam Physical Exam WDWN in NAD HEENT:  EOMI, sclera anicteric Neck:  No masses, no thyromegaly Lungs:  CTA bilaterally; normal respiratory effort CV:  Regular rate and rhythm; no murmurs Abd:  +bowel sounds, soft, non-tender, no masses GU:  Palpable mass in left groin, reducible; enlarges with Valsalva maneuver.  No sign of right inguinal hernia Ext:  Well-perfused; no edema Skin:  Warm, dry; no sign of jaundice   Data Reviewed None   Assessment    Left inguinal hernia No previous colon cancer screening       Plan   The colonoscopy showed only some diverticulosis and internal hemorrhoids, but no significant issues.  Dr. Rhea Belton obtained a CT of the chest, abd, pelvis  which showed some COPD changes, but no other worrisome findings.  She presents to discuss her [facility] repair. The left groin remains painful, but the bulge has not enlarged.   Left inguinal hernia repair with mesh. The surgical procedure has been discussed with the patient.  Potential risks, benefits, alternative treatments, and expected outcomes have been explained.  All of the patient's questions at this time have been answered.  The likelihood of reaching the patient's treatment goal is good.  The patient understand the proposed surgical procedure and wishes to proceed.   She is also scheduled for her first screening mammogram.  Wilmon Arms. Corliss Skains, MD, [facility]  11/16/2011 9:03 AM

## 2011-11-16 NOTE — Interval H&P Note (Signed)
History and Physical Interval Note:  11/16/2011 9:04 AM  Caroline Hill  has presented today for surgery, with the diagnosis of left ingunial hernia  The various methods of treatment have been discussed with the patient and family. After consideration of risks, benefits and other options for treatment, the patient has consented to  Procedure(s) (LRB): HERNIA REPAIR INGUINAL ADULT (Left) INSERTION OF MESH (Left) as a surgical intervention .  The patients' history has been reviewed, patient examined, no change in status, stable for surgery.  I have reviewed the patients' chart and labs.  Questions were answered to the patient's satisfaction.     Milford Cilento K.

## 2011-11-16 NOTE — Anesthesia Postprocedure Evaluation (Signed)
Anesthesia Post Note  Patient: Caroline Hill  Procedure(s) Performed: Procedure(s) (LRB): HERNIA REPAIR INGUINAL ADULT (Left) INSERTION OF MESH (Left)  Anesthesia type: General  Patient location: PACU  Post pain: Pain level controlled  Post assessment: Post-op Vital signs reviewed  Last Vitals:  Filed Vitals:   11/16/11 1100  BP: 151/66  Pulse: 88  Temp:   Resp: 23    Post vital signs: Reviewed  Level of consciousness: sedated  Complications: No apparent anesthesia complications

## 2011-11-20 ENCOUNTER — Encounter (HOSPITAL_COMMUNITY): Payer: Self-pay | Admitting: Surgery

## 2011-12-07 ENCOUNTER — Ambulatory Visit (INDEPENDENT_AMBULATORY_CARE_PROVIDER_SITE_OTHER): Payer: Medicare Other | Admitting: Surgery

## 2011-12-07 ENCOUNTER — Encounter (INDEPENDENT_AMBULATORY_CARE_PROVIDER_SITE_OTHER): Payer: Self-pay | Admitting: Surgery

## 2011-12-07 VITALS — BP 90/56 | HR 80 | Temp 98.5°F | Resp 14 | Ht 66.0 in | Wt 126.4 lb

## 2011-12-07 DIAGNOSIS — K4090 Unilateral inguinal hernia, without obstruction or gangrene, not specified as recurrent: Secondary | ICD-10-CM

## 2011-12-07 NOTE — Progress Notes (Signed)
S/p open inguinal hernia repair with mesh on 11/16/11 for a large direct inguinal hernia.  She is doing quite well.  Pain is resolved.  Minimal swelling.  Her incision is well-healed with no sign of infection or seroma.  No sign of recurrence.    She may begin resuming full activity.  She usually does not do any vigorous activity or exercise.  Follow-up PRN.  Wilmon Arms. Corliss Skains, MD, [facility]  12/07/2011 9:12 AM

## 2012-02-19 ENCOUNTER — Other Ambulatory Visit: Payer: Self-pay | Admitting: Family Medicine

## 2012-02-19 DIAGNOSIS — Z780 Asymptomatic menopausal state: Secondary | ICD-10-CM

## 2012-02-19 DIAGNOSIS — Z1231 Encounter for screening mammogram for malignant neoplasm of breast: Secondary | ICD-10-CM

## 2012-03-12 ENCOUNTER — Ambulatory Visit
Admission: RE | Admit: 2012-03-12 | Discharge: 2012-03-12 | Disposition: A | Discharge status: Home / Self Care | Payer: Medicare Other | Source: Ambulatory Visit | Attending: Family Medicine | Admitting: Family Medicine

## 2012-03-12 DIAGNOSIS — Z780 Asymptomatic menopausal state: Secondary | ICD-10-CM

## 2012-03-12 DIAGNOSIS — Z1231 Encounter for screening mammogram for malignant neoplasm of breast: Secondary | ICD-10-CM

## 2012-10-08 ENCOUNTER — Other Ambulatory Visit (HOSPITAL_COMMUNITY): Payer: Self-pay | Admitting: Cardiovascular Disease

## 2012-10-08 DIAGNOSIS — R0989 Other specified symptoms and signs involving the circulatory and respiratory systems: Secondary | ICD-10-CM

## 2012-10-14 HISTORY — PX: OTHER SURGICAL HISTORY: SHX169

## 2012-10-23 ENCOUNTER — Ambulatory Visit (HOSPITAL_COMMUNITY)
Admission: RE | Admit: 2012-10-23 | Discharge: 2012-10-23 | Disposition: A | Discharge status: Home / Self Care | Payer: Medicare Other | Source: Ambulatory Visit | Attending: Cardiovascular Disease | Admitting: Cardiovascular Disease

## 2012-10-23 DIAGNOSIS — R0989 Other specified symptoms and signs involving the circulatory and respiratory systems: Secondary | ICD-10-CM | POA: Insufficient documentation

## 2012-10-23 NOTE — Progress Notes (Signed)
Carotid Duplex Imaging Complete Caroline Hill 

## 2013-03-17 ENCOUNTER — Ambulatory Visit (INDEPENDENT_AMBULATORY_CARE_PROVIDER_SITE_OTHER): Payer: Medicare Other | Admitting: Neurology

## 2013-03-17 ENCOUNTER — Ambulatory Visit: Payer: Medicare Other | Admitting: Neurology

## 2013-03-17 ENCOUNTER — Encounter: Payer: Self-pay | Admitting: Neurology

## 2013-03-17 VITALS — BP 118/70 | HR 84 | Ht 66.0 in | Wt 116.0 lb

## 2013-03-17 DIAGNOSIS — R200 Anesthesia of skin: Secondary | ICD-10-CM

## 2013-03-17 DIAGNOSIS — R209 Unspecified disturbances of skin sensation: Secondary | ICD-10-CM

## 2013-03-17 DIAGNOSIS — R29898 Other symptoms and signs involving the musculoskeletal system: Secondary | ICD-10-CM

## 2013-03-17 DIAGNOSIS — M6281 Muscle weakness (generalized): Secondary | ICD-10-CM

## 2013-03-17 NOTE — Patient Instructions (Addendum)
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 This can be due to compression of a nerve in your arm.  As far as diagnostic testing: I would like to get a EMG/NCS to check for a compressed nerve.   I think you would benefit from PT/OT working with you to strengthen your hand.   I would like to see you back in 3 to 4 months, sooner if we need to. Please call us with any interim questions, concerns, problems, updates or refill requests.   Please also call us for any test results so we can go over those with you on the phone.  My clinical assistant and will answer any of your questions and relay your messages to me and also relay most of my messages to you.   Our phone number is [phone]. We also have an after hours call service for urgent matters and there is a physician on-call for urgent questions. For any emergencies you know to call 911 or go to the nearest emergency room
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
## 2013-03-24 ENCOUNTER — Ambulatory Visit: Payer: Medicare Other | Attending: Neurology | Admitting: Occupational Therapy

## 2013-03-24 DIAGNOSIS — M6281 Muscle weakness (generalized): Secondary | ICD-10-CM | POA: Insufficient documentation

## 2013-03-24 DIAGNOSIS — IMO0001 Reserved for inherently not codable concepts without codable children: Secondary | ICD-10-CM | POA: Insufficient documentation

## 2013-03-26 ENCOUNTER — Encounter (INDEPENDENT_AMBULATORY_CARE_PROVIDER_SITE_OTHER): Payer: Self-pay

## 2013-03-26 ENCOUNTER — Ambulatory Visit (INDEPENDENT_AMBULATORY_CARE_PROVIDER_SITE_OTHER): Payer: Medicare Other | Admitting: Neurology

## 2013-03-26 DIAGNOSIS — G5631 Lesion of radial nerve, right upper limb: Secondary | ICD-10-CM

## 2013-03-26 DIAGNOSIS — G5630 Lesion of radial nerve, unspecified upper limb: Secondary | ICD-10-CM

## 2013-03-26 DIAGNOSIS — R29898 Other symptoms and signs involving the musculoskeletal system: Secondary | ICD-10-CM

## 2013-03-26 DIAGNOSIS — Z0289 Encounter for other administrative examinations: Secondary | ICD-10-CM

## 2013-03-26 DIAGNOSIS — R200 Anesthesia of skin: Secondary | ICD-10-CM

## 2013-03-26 NOTE — Procedures (Signed)
  HISTORY:  Caroline Hill is a 67 year old patient with a history of onset of weakness of the right hand upon awakening approximately one month ago. The patient reports some mild discomfort in the upper arm on the right. The patient denies any neck discomfort. The patient is being evaluated for a possible neuropathy or a cervical radiculopathy.  NERVE CONDUCTION STUDIES:  Nerve conduction studies were performed on both upper extremities. The distal motor latencies and motor amplitudes for the median, radial, and ulnar nerves were within normal limits. The F wave latencies and nerve conduction velocities for these nerves were also normal. The sensory latencies for the median, radial, and ulnar nerves were normal.   EMG STUDIES:  EMG study was performed on the right upper extremity:  The first dorsal interosseous muscle reveals 2 to 4 K units with full recruitment. No fibrillations or positive waves were noted. The abductor pollicis brevis muscle reveals 2 to 4 K units with full recruitment. No fibrillations or positive waves were noted. The extensor indicis proprius muscle reveals 2 to 5 K units with decreased recruitment. 2+ fibrillations and positive waves were noted. The pronator teres muscle reveals 2 to 5 K units with decreased recruitment. No fibrillations or positive waves were noted. The brachioradialis muscle reveals 2 to 4 K uints with decreased recruitment. 3+ fibrillations and positive waves were seen. The biceps muscle reveals 1 to 4 K units with slightly decreased recruitment. No fibrillations or positive waves were noted. The triceps muscle reveals 2 to 4 K units with full recruitment. No fibrillations or positive waves were noted. Occasional complex tested discharges were seen. The anterior deltoid muscle reveals 2 to 3 K units with full recruitment. No fibrillations or positive waves were noted. The cervical paraspinal muscles were tested at 2 levels. No abnormalities of  insertional activity were seen at either level tested. There was good relaxation.   IMPRESSION:  Nerve conduction studies done on both upper extremities were within normal limits. 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 There is also some evidence of an overlying chronic, stable C6 radiculopathy. No other significant abnormalities were seen.  Marlan Palau MD 03/26/2013 10:57 AM  [facility] [address] [place], [place] [address]  Phone [phone] Fax [phone]

## 2013-06-08 ENCOUNTER — Other Ambulatory Visit: Payer: Self-pay | Admitting: *Deleted

## 2013-06-08 MED ORDER — METOPROLOL SUCCINATE ER 25 MG PO TB24: 25.0000 mg | ORAL_TABLET | Freq: Every day | ORAL | Status: DC

## 2013-10-04 ENCOUNTER — Encounter: Payer: Self-pay | Admitting: *Deleted

## 2013-10-06 ENCOUNTER — Ambulatory Visit (INDEPENDENT_AMBULATORY_CARE_PROVIDER_SITE_OTHER): Payer: Medicare HMO | Admitting: Internal Medicine

## 2013-10-06 ENCOUNTER — Encounter: Payer: Self-pay | Admitting: Internal Medicine

## 2013-10-06 VITALS — BP 124/64 | HR 64 | Ht 66.0 in | Wt 118.7 lb

## 2013-10-06 DIAGNOSIS — J449 Chronic obstructive pulmonary disease, unspecified: Secondary | ICD-10-CM

## 2013-10-06 DIAGNOSIS — I10 Essential (primary) hypertension: Secondary | ICD-10-CM

## 2013-10-06 DIAGNOSIS — J4489 Other specified chronic obstructive pulmonary disease: Secondary | ICD-10-CM

## 2013-10-06 DIAGNOSIS — I340 Nonrheumatic mitral (valve) insufficiency: Secondary | ICD-10-CM

## 2013-10-06 DIAGNOSIS — I6529 Occlusion and stenosis of unspecified carotid artery: Secondary | ICD-10-CM

## 2013-10-06 DIAGNOSIS — I739 Peripheral vascular disease, unspecified: Secondary | ICD-10-CM

## 2013-10-06 DIAGNOSIS — E785 Hyperlipidemia, unspecified: Secondary | ICD-10-CM

## 2013-10-06 DIAGNOSIS — I779 Disorder of arteries and arterioles, unspecified: Secondary | ICD-10-CM

## 2013-10-06 DIAGNOSIS — I059 Rheumatic mitral valve disease, unspecified: Secondary | ICD-10-CM

## 2013-10-06 DIAGNOSIS — I2510 Atherosclerotic heart disease of native coronary artery without angina pectoris: Secondary | ICD-10-CM

## 2013-10-06 NOTE — Patient Instructions (Signed)
Your physician has requested that you have a carotid duplex. This test is an ultrasound of the carotid arteries in your neck. It looks at blood flow through these arteries that supply the brain with blood. Allow one hour for this exam. There are no restrictions or special instructions.  Your physician wants you to follow-up in: 1 year with Dr. Debara Pickett. You will receive a reminder letter in the mail two months in advance. If you don't receive a letter, please call our office to schedule the follow-up appointment.
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
## 2013-11-23 ENCOUNTER — Encounter (HOSPITAL_COMMUNITY): Payer: Medicare Other

## 2013-12-01 ENCOUNTER — Encounter (HOSPITAL_COMMUNITY): Payer: Self-pay | Admitting: Emergency Medicine

## 2013-12-01 ENCOUNTER — Emergency Department (HOSPITAL_COMMUNITY): Payer: Medicare HMO

## 2013-12-01 ENCOUNTER — Inpatient Hospital Stay (HOSPITAL_COMMUNITY)
Admission: EM | Admit: 2013-12-01 | Discharge: 2013-12-08 | DRG: 190 | Disposition: A | Discharge status: Home Health | Payer: Medicare HMO | Attending: Internal Medicine | Admitting: Internal Medicine

## 2013-12-01 DIAGNOSIS — G9341 Metabolic encephalopathy: Secondary | ICD-10-CM | POA: Diagnosis not present

## 2013-12-01 DIAGNOSIS — I739 Peripheral vascular disease, unspecified: Secondary | ICD-10-CM

## 2013-12-01 DIAGNOSIS — M1990 Unspecified osteoarthritis, unspecified site: Secondary | ICD-10-CM

## 2013-12-01 DIAGNOSIS — J441 Chronic obstructive pulmonary disease with (acute) exacerbation: Principal | ICD-10-CM

## 2013-12-01 DIAGNOSIS — Z808 Family history of malignant neoplasm of other organs or systems: Secondary | ICD-10-CM

## 2013-12-01 DIAGNOSIS — T380X5A Adverse effect of glucocorticoids and synthetic analogues, initial encounter: Secondary | ICD-10-CM | POA: Diagnosis not present

## 2013-12-01 DIAGNOSIS — I340 Nonrheumatic mitral (valve) insufficiency: Secondary | ICD-10-CM

## 2013-12-01 DIAGNOSIS — S68118A Complete traumatic metacarpophalangeal amputation of other finger, initial encounter: Secondary | ICD-10-CM

## 2013-12-01 DIAGNOSIS — M069 Rheumatoid arthritis, unspecified: Secondary | ICD-10-CM | POA: Diagnosis present

## 2013-12-01 DIAGNOSIS — F209 Schizophrenia, unspecified: Secondary | ICD-10-CM | POA: Diagnosis present

## 2013-12-01 DIAGNOSIS — I10 Essential (primary) hypertension: Secondary | ICD-10-CM

## 2013-12-01 DIAGNOSIS — IMO0002 Reserved for concepts with insufficient information to code with codable children: Secondary | ICD-10-CM

## 2013-12-01 DIAGNOSIS — E559 Vitamin D deficiency, unspecified: Secondary | ICD-10-CM | POA: Diagnosis present

## 2013-12-01 DIAGNOSIS — K4090 Unilateral inguinal hernia, without obstruction or gangrene, not specified as recurrent: Secondary | ICD-10-CM

## 2013-12-01 DIAGNOSIS — F32A Depression, unspecified: Secondary | ICD-10-CM

## 2013-12-01 DIAGNOSIS — F411 Generalized anxiety disorder: Secondary | ICD-10-CM | POA: Diagnosis present

## 2013-12-01 DIAGNOSIS — Z885 Allergy status to narcotic agent status: Secondary | ICD-10-CM

## 2013-12-01 DIAGNOSIS — E872 Acidosis, unspecified: Secondary | ICD-10-CM | POA: Diagnosis present

## 2013-12-01 DIAGNOSIS — F319 Bipolar disorder, unspecified: Secondary | ICD-10-CM

## 2013-12-01 DIAGNOSIS — J449 Chronic obstructive pulmonary disease, unspecified: Secondary | ICD-10-CM

## 2013-12-01 DIAGNOSIS — F17200 Nicotine dependence, unspecified, uncomplicated: Secondary | ICD-10-CM | POA: Diagnosis present

## 2013-12-01 DIAGNOSIS — I779 Disorder of arteries and arterioles, unspecified: Secondary | ICD-10-CM

## 2013-12-01 DIAGNOSIS — Z79899 Other long term (current) drug therapy: Secondary | ICD-10-CM

## 2013-12-01 DIAGNOSIS — Z9089 Acquired absence of other organs: Secondary | ICD-10-CM

## 2013-12-01 DIAGNOSIS — Z9981 Dependence on supplemental oxygen: Secondary | ICD-10-CM

## 2013-12-01 DIAGNOSIS — I2510 Atherosclerotic heart disease of native coronary artery without angina pectoris: Secondary | ICD-10-CM | POA: Diagnosis present

## 2013-12-01 DIAGNOSIS — K5909 Other constipation: Secondary | ICD-10-CM

## 2013-12-01 DIAGNOSIS — F1210 Cannabis abuse, uncomplicated: Secondary | ICD-10-CM

## 2013-12-01 DIAGNOSIS — Z681 Body mass index (BMI) 19 or less, adult: Secondary | ICD-10-CM

## 2013-12-01 DIAGNOSIS — E785 Hyperlipidemia, unspecified: Secondary | ICD-10-CM

## 2013-12-01 DIAGNOSIS — F419 Anxiety disorder, unspecified: Secondary | ICD-10-CM

## 2013-12-01 DIAGNOSIS — Z8249 Family history of ischemic heart disease and other diseases of the circulatory system: Secondary | ICD-10-CM

## 2013-12-01 DIAGNOSIS — Z9861 Coronary angioplasty status: Secondary | ICD-10-CM

## 2013-12-01 DIAGNOSIS — D72829 Elevated white blood cell count, unspecified: Secondary | ICD-10-CM | POA: Diagnosis not present

## 2013-12-01 DIAGNOSIS — J9620 Acute and chronic respiratory failure, unspecified whether with hypoxia or hypercapnia: Secondary | ICD-10-CM

## 2013-12-01 DIAGNOSIS — G9340 Encephalopathy, unspecified: Secondary | ICD-10-CM

## 2013-12-01 DIAGNOSIS — Z66 Do not resuscitate: Secondary | ICD-10-CM | POA: Diagnosis present

## 2013-12-01 DIAGNOSIS — I498 Other specified cardiac arrhythmias: Secondary | ICD-10-CM | POA: Diagnosis present

## 2013-12-01 DIAGNOSIS — Z7982 Long term (current) use of aspirin: Secondary | ICD-10-CM

## 2013-12-01 DIAGNOSIS — E43 Unspecified severe protein-calorie malnutrition: Secondary | ICD-10-CM

## 2013-12-01 DIAGNOSIS — F329 Major depressive disorder, single episode, unspecified: Secondary | ICD-10-CM

## 2013-12-01 DIAGNOSIS — Z720 Tobacco use: Secondary | ICD-10-CM

## 2013-12-01 HISTORY — DX: Dependence on supplemental oxygen: Z99.81

## 2013-12-01 HISTORY — DX: Schizophrenia, unspecified: F20.9

## 2013-12-01 LAB — BASIC METABOLIC PANEL
BUN: 23 mg/dL (ref 6–23)
CHLORIDE: 96 meq/L (ref 96–112)
CO2: 42 mEq/L (ref 19–32)
Calcium: 9.6 mg/dL (ref 8.4–10.5)
Creatinine, Ser: 0.6 mg/dL (ref 0.50–1.10)
GFR calc Af Amer: >90 mL/min (ref >90)
GFR calc non Af Amer: >90 mL/min (ref >90)
Glucose, Bld: 101 mg/dL — ABNORMAL HIGH (ref 70–99)
POTASSIUM: 3.8 meq/L (ref 3.7–5.3)
SODIUM: 146 meq/L (ref 137–147)

## 2013-12-01 LAB — CBC WITH DIFFERENTIAL/PLATELET
BASOS PCT: 0 % (ref 0–1)
Basophils Absolute: 0 10*3/uL (ref 0.0–0.1)
EOS ABS: 0 10*3/uL (ref 0.0–0.7)
Eosinophils Relative: 0 % (ref 0–5)
HCT: 49.1 % — ABNORMAL HIGH (ref 36.0–46.0)
Hemoglobin: 15.7 g/dL — ABNORMAL HIGH (ref 12.0–15.0)
LYMPHS ABS: 1.1 10*3/uL (ref 0.7–4.0)
Lymphocytes Relative: 14 % (ref 12–46)
MCH: 31.8 pg (ref 26.0–34.0)
MCHC: 32 g/dL (ref 30.0–36.0)
MCV: 99.4 fL (ref 78.0–100.0)
MONOS PCT: 11 % (ref 3–12)
Monocytes Absolute: 0.8 10*3/uL (ref 0.1–1.0)
NEUTROS ABS: 5.6 10*3/uL (ref 1.7–7.7)
Neutrophils Relative %: 75 % (ref 43–77)
PLATELETS: 180 10*3/uL (ref 150–400)
RBC: 4.94 MIL/uL (ref 3.87–5.11)
RDW: 14 % (ref 11.5–15.5)
WBC: 7.5 10*3/uL (ref 4.0–10.5)

## 2013-12-01 LAB — TROPONIN I: Troponin I: <0.3 ng/mL (ref <0.30)

## 2013-12-01 MED ORDER — IPRATROPIUM-ALBUTEROL 0.5-2.5 (3) MG/3ML IN SOLN
3.0000 mL | RESPIRATORY_TRACT | Status: DC
  Administered 2013-12-01 – 2013-12-02 (×3): 3 mL via RESPIRATORY_TRACT
  Filled 2013-12-01 (×3): qty 3

## 2013-12-01 MED ORDER — RISPERIDONE 2 MG PO TABS
2.0000 mg | ORAL_TABLET | Freq: Two times a day (BID) | ORAL | Status: DC
  Administered 2013-12-01 – 2013-12-03 (×4): 2 mg via ORAL
  Filled 2013-12-01 (×5): qty 1

## 2013-12-01 MED ORDER — TRAZODONE HCL 100 MG PO TABS
100.0000 mg | ORAL_TABLET | Freq: Every day | ORAL | Status: DC
  Administered 2013-12-01 – 2013-12-02 (×2): 100 mg via ORAL
  Filled 2013-12-01 (×3): qty 1

## 2013-12-01 MED ORDER — ACETAMINOPHEN 650 MG RE SUPP: 650.0000 mg | Freq: Four times a day (QID) | RECTAL | Status: DC | PRN

## 2013-12-01 MED ORDER — SODIUM CHLORIDE 0.9 % IJ SOLN
3.0000 mL | Freq: Two times a day (BID) | INTRAMUSCULAR | Status: DC
  Administered 2013-12-02 – 2013-12-08 (×12): 3 mL via INTRAVENOUS

## 2013-12-01 MED ORDER — CLONAZEPAM 0.5 MG PO TABS
0.5000 mg | ORAL_TABLET | Freq: Every day | ORAL | Status: DC
  Administered 2013-12-01 – 2013-12-07 (×6): 0.5 mg via ORAL
  Filled 2013-12-01 (×7): qty 1

## 2013-12-01 MED ORDER — MOMETASONE FURO-FORMOTEROL FUM 200-5 MCG/ACT IN AERO
2.0000 | INHALATION_SPRAY | Freq: Two times a day (BID) | RESPIRATORY_TRACT | Status: DC
  Administered 2013-12-01: 2 via RESPIRATORY_TRACT
  Filled 2013-12-01: qty 8.8

## 2013-12-01 MED ORDER — VITAMIN B-12 1000 MCG PO TABS
5000.0000 ug | ORAL_TABLET | Freq: Every day | ORAL | Status: DC
  Administered 2013-12-02 – 2013-12-08 (×7): 5000 ug via ORAL
  Filled 2013-12-01 (×9): qty 5

## 2013-12-01 MED ORDER — CHOLECALCIFEROL 125 MCG (5000 UT) PO TABS: 5000.0000 [IU] | ORAL_TABLET | Freq: Every day | ORAL | Status: DC

## 2013-12-01 MED ORDER — LEVOFLOXACIN IN D5W 500 MG/100ML IV SOLN
500.0000 mg | INTRAVENOUS | Status: DC
  Administered 2013-12-02 – 2013-12-06 (×4): 500 mg via INTRAVENOUS
  Filled 2013-12-01 (×8): qty 100

## 2013-12-01 MED ORDER — ONDANSETRON HCL 4 MG/2ML IJ SOLN: 4.0000 mg | Freq: Four times a day (QID) | INTRAMUSCULAR | Status: DC | PRN

## 2013-12-01 MED ORDER — ALBUTEROL SULFATE (2.5 MG/3ML) 0.083% IN NEBU: 10.0000 mg | INHALATION_SOLUTION | Freq: Once | RESPIRATORY_TRACT | Status: DC

## 2013-12-01 MED ORDER — AMLODIPINE BESYLATE 10 MG PO TABS
10.0000 mg | ORAL_TABLET | Freq: Every day | ORAL | Status: DC
  Administered 2013-12-02 – 2013-12-03 (×2): 10 mg via ORAL
  Filled 2013-12-01 (×2): qty 1

## 2013-12-01 MED ORDER — ASPIRIN EC 81 MG PO TBEC
81.0000 mg | DELAYED_RELEASE_TABLET | Freq: Every day | ORAL | Status: DC
  Administered 2013-12-02 – 2013-12-08 (×7): 81 mg via ORAL
  Filled 2013-12-01 (×10): qty 1

## 2013-12-01 MED ORDER — IPRATROPIUM BROMIDE 0.02 % IN SOLN
0.5000 mg | Freq: Once | RESPIRATORY_TRACT | Status: AC
  Administered 2013-12-01: 0.5 mg via RESPIRATORY_TRACT
  Filled 2013-12-01: qty 2.5

## 2013-12-01 MED ORDER — ENOXAPARIN SODIUM 40 MG/0.4ML ~~LOC~~ SOLN
40.0000 mg | SUBCUTANEOUS | Status: DC
  Administered 2013-12-01 – 2013-12-07 (×7): 40 mg via SUBCUTANEOUS
  Filled 2013-12-01 (×8): qty 0.4

## 2013-12-01 MED ORDER — ONDANSETRON HCL 4 MG PO TABS
4.0000 mg | ORAL_TABLET | Freq: Four times a day (QID) | ORAL | Status: DC | PRN
Start: 2013-12-01 — End: 2013-12-08

## 2013-12-01 MED ORDER — BIOTENE DRY MOUTH MT LIQD
15.0000 mL | Freq: Two times a day (BID) | OROMUCOSAL | Status: DC
  Administered 2013-12-02 – 2013-12-05 (×6): 15 mL via OROMUCOSAL

## 2013-12-01 MED ORDER — CHLORHEXIDINE GLUCONATE 0.12 % MT SOLN
15.0000 mL | Freq: Two times a day (BID) | OROMUCOSAL | Status: DC
  Administered 2013-12-02 – 2013-12-05 (×8): 15 mL via OROMUCOSAL
  Filled 2013-12-01 (×11): qty 15

## 2013-12-01 MED ORDER — ACETAMINOPHEN 325 MG PO TABS
650.0000 mg | ORAL_TABLET | Freq: Four times a day (QID) | ORAL | Status: DC | PRN
  Administered 2013-12-01: 650 mg via ORAL
  Filled 2013-12-01: qty 2

## 2013-12-01 MED ORDER — AMLODIPINE BESY-BENAZEPRIL HCL 10-20 MG PO CAPS: 1.0000 | ORAL_CAPSULE | Freq: Every day | ORAL | Status: DC

## 2013-12-01 MED ORDER — VITAMIN D3 25 MCG (1000 UNIT) PO TABS
5000.0000 [IU] | ORAL_TABLET | Freq: Every day | ORAL | Status: DC
  Administered 2013-12-01 – 2013-12-08 (×8): 5000 [IU] via ORAL
  Filled 2013-12-01 (×8): qty 5

## 2013-12-01 MED ORDER — ALBUTEROL (5 MG/ML) CONTINUOUS INHALATION SOLN
10.0000 mg/h | INHALATION_SOLUTION | RESPIRATORY_TRACT | Status: DC
  Administered 2013-12-01: 10 mg/h via RESPIRATORY_TRACT

## 2013-12-01 MED ORDER — HYDROCODONE-ACETAMINOPHEN 5-325 MG PO TABS
1.0000 | ORAL_TABLET | ORAL | Status: DC | PRN
  Administered 2013-12-01 – 2013-12-08 (×15): 1 via ORAL
  Filled 2013-12-01 (×16): qty 1

## 2013-12-01 MED ORDER — LAMOTRIGINE 100 MG PO TABS
100.0000 mg | ORAL_TABLET | Freq: Every day | ORAL | Status: DC
  Administered 2013-12-03 – 2013-12-08 (×6): 100 mg via ORAL
  Filled 2013-12-01 (×9): qty 1

## 2013-12-01 MED ORDER — CLONAZEPAM 0.5 MG PO TABS
0.2500 mg | ORAL_TABLET | Freq: Two times a day (BID) | ORAL | Status: DC
  Administered 2013-12-01 – 2013-12-03 (×4): 0.25 mg via ORAL
  Filled 2013-12-01 (×4): qty 1

## 2013-12-01 MED ORDER — FLUOXETINE HCL 20 MG PO TABS
60.0000 mg | ORAL_TABLET | Freq: Every day | ORAL | Status: DC
  Filled 2013-12-01 (×2): qty 3

## 2013-12-01 MED ORDER — METHYLPREDNISOLONE SODIUM SUCC 125 MG IJ SOLR
80.0000 mg | Freq: Four times a day (QID) | INTRAMUSCULAR | Status: DC
  Administered 2013-12-01 – 2013-12-02 (×4): 80 mg via INTRAVENOUS
  Filled 2013-12-01 (×5): qty 1.28
  Filled 2013-12-01: qty 2
  Filled 2013-12-01: qty 1.28

## 2013-12-01 MED ORDER — METOPROLOL SUCCINATE ER 25 MG PO TB24
25.0000 mg | ORAL_TABLET | Freq: Every day | ORAL | Status: DC
  Administered 2013-12-02: 25 mg via ORAL
  Filled 2013-12-01 (×2): qty 1

## 2013-12-01 MED ORDER — ATORVASTATIN CALCIUM 10 MG PO TABS
10.0000 mg | ORAL_TABLET | Freq: Every day | ORAL | Status: DC
  Administered 2013-12-01 – 2013-12-07 (×6): 10 mg via ORAL
  Filled 2013-12-01 (×9): qty 1

## 2013-12-01 MED ORDER — BENAZEPRIL HCL 20 MG PO TABS
20.0000 mg | ORAL_TABLET | Freq: Every day | ORAL | Status: DC
  Administered 2013-12-02: 20 mg via ORAL
  Filled 2013-12-01: qty 1

## 2013-12-01 MED ORDER — ALBUTEROL (5 MG/ML) CONTINUOUS INHALATION SOLN
INHALATION_SOLUTION | RESPIRATORY_TRACT | Status: AC
  Administered 2013-12-01: 10 mg/h via RESPIRATORY_TRACT
  Filled 2013-12-01: qty 20

## 2013-12-01 MED ORDER — CLONAZEPAM 0.5 MG PO TABS: 0.2500 mg | ORAL_TABLET | Freq: Three times a day (TID) | ORAL | Status: DC

## 2013-12-01 MED ORDER — SODIUM CHLORIDE 0.9 % IV SOLN
INTRAVENOUS | Status: DC
  Administered 2013-12-01 – 2013-12-02 (×2): via INTRAVENOUS

## 2013-12-01 NOTE — ED Notes (Signed)
68 yo at the PCP for cough and difficulty breathing for 3 days. PCP records indicate Saturation of 83% on room air pt received albuterol neb x 2 Sats were consistent in the 80's. Per EMS Left FA 20 G IV gave 125 solumedro, 5mg  albuterol and 0.5 atrovent. Wheezes in left and right side. Pt. Has productive cough with green/un-known color at the present. Half pack smoker a day and Card Stent placed 20 years ago.   Vitals  129/74 HR 95 Resp 32 Sat 32% 3L

## 2013-12-01 NOTE — ED Notes (Signed)
Request for med sent to Pharmacy

## 2013-12-01 NOTE — ED Notes (Signed)
MD at bedside. 

## 2013-12-01 NOTE — ED Notes (Signed)
Notified Dr. Venora Maples and Kriste Basque RN with CO2 42 per lab

## 2013-12-01 NOTE — ED Notes (Signed)
Notified EDP about Oxygen Sat and HR
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
## 2013-12-02 ENCOUNTER — Inpatient Hospital Stay (HOSPITAL_COMMUNITY): Payer: Medicare HMO

## 2013-12-02 DIAGNOSIS — F341 Dysthymic disorder: Secondary | ICD-10-CM

## 2013-12-02 DIAGNOSIS — F319 Bipolar disorder, unspecified: Secondary | ICD-10-CM

## 2013-12-02 DIAGNOSIS — E43 Unspecified severe protein-calorie malnutrition: Secondary | ICD-10-CM | POA: Insufficient documentation

## 2013-12-02 LAB — BASIC METABOLIC PANEL
BUN: 20 mg/dL (ref 6–23)
CO2: 38 mEq/L — ABNORMAL HIGH (ref 19–32)
Calcium: 9.2 mg/dL (ref 8.4–10.5)
Chloride: 98 mEq/L (ref 96–112)
Creatinine, Ser: 0.55 mg/dL (ref 0.50–1.10)
Glucose, Bld: 154 mg/dL — ABNORMAL HIGH (ref 70–99)
POTASSIUM: 4.1 meq/L (ref 3.7–5.3)
SODIUM: 143 meq/L (ref 137–147)

## 2013-12-02 MED ORDER — METHYLPREDNISOLONE SODIUM SUCC 125 MG IJ SOLR
80.0000 mg | Freq: Three times a day (TID) | INTRAMUSCULAR | Status: DC
  Administered 2013-12-02 – 2013-12-04 (×4): 80 mg via INTRAVENOUS
  Filled 2013-12-02 (×8): qty 1.28

## 2013-12-02 MED ORDER — LEVALBUTEROL HCL 0.63 MG/3ML IN NEBU
0.6300 mg | INHALATION_SOLUTION | Freq: Four times a day (QID) | RESPIRATORY_TRACT | Status: DC
  Administered 2013-12-02 – 2013-12-07 (×21): 0.63 mg via RESPIRATORY_TRACT
  Filled 2013-12-02 (×42): qty 3

## 2013-12-02 MED ORDER — BENZONATATE 100 MG PO CAPS
200.0000 mg | ORAL_CAPSULE | Freq: Three times a day (TID) | ORAL | Status: DC | PRN
  Filled 2013-12-02: qty 2

## 2013-12-02 MED ORDER — PANTOPRAZOLE SODIUM 40 MG PO TBEC
40.0000 mg | DELAYED_RELEASE_TABLET | Freq: Every day | ORAL | Status: DC
  Administered 2013-12-02 – 2013-12-08 (×6): 40 mg via ORAL
  Filled 2013-12-02 (×6): qty 1

## 2013-12-02 MED ORDER — ENSURE COMPLETE PO LIQD
237.0000 mL | Freq: Two times a day (BID) | ORAL | Status: DC
  Administered 2013-12-02 – 2013-12-08 (×11): 237 mL via ORAL

## 2013-12-02 MED ORDER — FLUOXETINE HCL 20 MG PO CAPS
60.0000 mg | ORAL_CAPSULE | Freq: Every day | ORAL | Status: DC
  Administered 2013-12-02 – 2013-12-08 (×7): 60 mg via ORAL
  Filled 2013-12-02 (×8): qty 3

## 2013-12-02 MED ORDER — NICOTINE 21 MG/24HR TD PT24
21.0000 mg | MEDICATED_PATCH | Freq: Every day | TRANSDERMAL | Status: DC
  Administered 2013-12-02 – 2013-12-08 (×7): 21 mg via TRANSDERMAL
  Filled 2013-12-02 (×7): qty 1

## 2013-12-02 MED ORDER — GUAIFENESIN ER 600 MG PO TB12
600.0000 mg | ORAL_TABLET | Freq: Two times a day (BID) | ORAL | Status: DC
  Administered 2013-12-02 – 2013-12-06 (×9): 600 mg via ORAL
  Filled 2013-12-02 (×11): qty 1

## 2013-12-02 MED ORDER — IOHEXOL 350 MG/ML SOLN
100.0000 mL | Freq: Once | INTRAVENOUS | Status: AC | PRN
  Administered 2013-12-02: 80 mL via INTRAVENOUS

## 2013-12-02 MED ORDER — IPRATROPIUM BROMIDE 0.02 % IN SOLN
0.5000 mg | Freq: Four times a day (QID) | RESPIRATORY_TRACT | Status: DC
  Administered 2013-12-02 – 2013-12-07 (×21): 0.5 mg via RESPIRATORY_TRACT
  Filled 2013-12-02 (×22): qty 2.5

## 2013-12-02 MED ORDER — METOPROLOL SUCCINATE ER 25 MG PO TB24
25.0000 mg | ORAL_TABLET | Freq: Every day | ORAL | Status: DC
  Administered 2013-12-03: 25 mg via ORAL
  Filled 2013-12-02 (×2): qty 1
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
## 2013-12-02 NOTE — Progress Notes (Signed)
PATIENT DETAILS Name: Caroline Hill Age: 68 y.o. Sex: female Date of Birth: [birth year] Admit Date: 12/01/2013 Admitting Physician Kinnie Feil, MD PVX:[id number],VVZSMOL C, MD  Subjective: Still with shortness of breath-but better than on admission  Assessment/Plan: Principal Problem: Acute on Chronic Hypoxic Resp Failure -secondary to COPD exac,CT Angio Chest negative for Pul Embolism -treat with Solumedrol,nebs, Levaquin  COPD exacerbation -some improvement than on admission -c/w Solumedrol, Nebs-but change to Xopenex given tachycardia and empiric Levaquin  CAP -seen on CT Chest -on Levaquin-day 2 -afebrile, non toxic looking. No leukocytosis  Sinus Tachycardia -suspect secondary to anxiety,albuterol nebs. CT Angio chest neg. -cautiously continue with Metoprolol for now. If no improvement in COPD exac-will need to stop Metoprolol    HTN (hypertension) - Blood pressure soft- start benazepril, continue cautiously with amlodipine and metoprolol with holding parameters. Follow for now.  Anxiety - Continue Klonopin.  History of CAD - Continue with aspirin, beta blockers and statins. Shortness of breath is from COPD exacerbation, no chest pains.    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    MEDICATIONS: Scheduled Meds: . amLODipine  10 mg Oral Daily   And  .  benazepril  20 mg Oral Daily  . antiseptic oral rinse  15 mL Mouth Rinse q12n4p  . aspirin EC  81 mg Oral Q breakfast  . atorvastatin  10 mg Oral q1800  . chlorhexidine  15 mL Mouth Rinse BID  . cholecalciferol  5,000 Units Oral Daily  . clonazePAM  0.25 mg Oral BID AC  . clonazePAM  0.5 mg Oral QHS  . enoxaparin (LOVENOX) injection  40 mg Subcutaneous Q24H  . feeding supplement (ENSURE COMPLETE)  237 mL Oral BID BM  . FLUoxetine  60 mg Oral Daily  . guaiFENesin  600 mg Oral BID  . ipratropium  0.5 mg Nebulization Q6H  . lamoTRIgine  100 mg Oral Q breakfast  . levalbuterol  0.63 mg Nebulization Q6H  . levofloxacin (LEVAQUIN) IV  500 mg Intravenous Q24H  . methylPREDNISolone (SOLU-MEDROL) injection  80 mg Intravenous Q6H  . metoprolol succinate  25 mg Oral Q breakfast  . nicotine  21 mg Transdermal Daily  . pantoprazole  40 mg Oral Q1200  . risperiDONE  2 mg Oral BID  . sodium chloride  3 mL Intravenous Q12H  . traZODone  100 mg Oral QHS  . vitamin B-12  5,000 mcg Oral Q breakfast   Continuous Infusions: . sodium chloride 10 mL/hr (12/02/13 0933)   PRN Meds:.acetaminophen, acetaminophen, benzonatate, HYDROcodone-acetaminophen, ondansetron (ZOFRAN) IV, ondansetron  Antibiotics: Anti-infectives   Start     Dose/Rate Route Frequency Ordered Stop   12/01/13 1615  levofloxacin (LEVAQUIN) IVPB 500 mg     500 mg 100 mL/hr over 60 Minutes Intravenous Every 24 hours 12/01/13 1602         PHYSICAL EXAM: Vital signs in last 24 hours: Filed Vitals:   12/02/13 0786 12/02/13 7544  12/02/13 0930 12/02/13 1414  BP:  134/69 134/69 92/53  Pulse:   112 94  Temp:    98.1 F (36.7 C)  TempSrc:    Oral  Resp:    18  Height:      Weight:      SpO2: 88%   92%    Weight change:  Filed Weights   12/01/13 1316 12/01/13 1710  Weight: 49.442 kg (109 lb) 48.988 kg (108 lb)   Body mass index is 17.17 kg/(m^2).   Gen Exam: Awake and alert with clear speech.  Not in  distress-tremulous Neck: Supple, No JVD.   Chest: Decreased air entry B/L-still with coarse rhonchi CVS: S1 S2 Regular, tachycardic Abdomen: soft, BS +, non tender, non distended.  Extremities: no edema, lower extremities warm to touch. Neurologic: Non Focal.   Skin: No Rash.   Wounds: N/A.    Intake/Output from previous day:  Intake/Output Summary (Last 24 hours) at 12/02/13 1452 Last data filed at 12/02/13 0933  Gross per 24 hour  Intake      3 ml  Output      0 ml  Net      3 ml     LAB RESULTS: CBC  Recent Labs Lab 12/01/13 1323  WBC 7.5  HGB 15.7*  HCT 49.1*  PLT 180  MCV 99.4  MCH 31.8  MCHC 32.0  RDW 14.0  LYMPHSABS 1.1  MONOABS 0.8  EOSABS 0.0  BASOSABS 0.0    Chemistries   Recent Labs Lab 12/01/13 1323 12/02/13 0750  NA 146 143  K 3.8 4.1  CL 96 98  CO2 42* 38*  GLUCOSE 101* 154*  BUN 23 20  CREATININE 0.60 0.55  CALCIUM 9.6 9.2    CBG: No results found for this basename: GLUCAP,  in the last 168 hours  GFR Estimated Creatinine Clearance: 52.8 ml/min (by C-G formula based on Cr of 0.55).  Coagulation profile No results found for this basename: INR, PROTIME,  in the last 168 hours  Cardiac Enzymes  Recent Labs Lab 12/01/13 1323  TROPONINI <0.30    No components found with this basename: POCBNP,  No results found for this basename: DDIMER,  in the last 72 hours No results found for this basename: HGBA1C,  in the last 72 hours No results found for this basename: CHOL, HDL, LDLCALC, TRIG, CHOLHDL, LDLDIRECT,  in the last 72 hours No results found for this basename: TSH, T4TOTAL, FREET3, T3FREE, THYROIDAB,  in the last 72 hours No results found for this basename: VITAMINB12, FOLATE, FERRITIN, TIBC, IRON, RETICCTPCT,  in the last 72 hours No results found for this basename: LIPASE, AMYLASE,  in the last 72 hours  Urine Studies No results found for this basename: UACOL, UAPR, USPG, UPH, UTP, UGL, UKET, UBIL, UHGB, UNIT, UROB, ULEU,  UEPI, UWBC, URBC, UBAC, CAST, CRYS, UCOM, BILUA,  in the last 72 hours  MICROBIOLOGY: No results found for this or any previous visit (from the past 240 hour(s)).  RADIOLOGY STUDIES/RESULTS: Dg Chest 2 View  12/01/2013   CLINICAL DATA:  Shortness of breath, cough and congestion with history of COPD, coronary artery disease, and tobacco use  EXAM: CHEST  2 VIEW  COMPARISON:  CT scan of the chest dated August 17, 2011.  FINDINGS: The lungs are mildly hyperinflated. The interstitial markings are coarse bilaterally. There is subtle nodular density that projects just lateral to the right main descending pulmonary artery on the frontal view which is not clearly  demonstrated on the lateral film. This may correspond to the previously demonstrated nodule in the right middle lobe. The cardiopericardial silhouette is normal in size. The pulmonary vascularity is not engorged. There is no pleural effusion or pneumothorax. The observed portions of the bony thorax exhibit no acute abnormalities.  IMPRESSION: There is hyperinflation consistent with COPD. There is no evidence of pneumonia nor CHF. There is subtle nodular density in the right mid to lower lung on the frontal film which may correspond to previously demonstrated nodule on the CT scan dated August 17, 2011. Follow-up noncontrast CT scanning is recommended to evaluate this area of nodularity for stability or change.   Electronically Signed   By: David  Martinique   On: 12/01/2013 14:02   Ct Angio Chest Pe W/cm &/or Wo Cm  12/02/2013   ADDENDUM REPORT: 12/02/2013 11:45  ADDENDUM: Impression Number 5 as follows:  5. Triangular opacity in the right middle lobe along the minor fissure unchanged compared with 08/17/2011.   Electronically Signed   By: Kathreen Devoid   On: 12/02/2013 11:45   12/02/2013   CLINICAL DATA:  Upper chest pain, productive cough  EXAM: CT ANGIOGRAPHY CHEST WITH CONTRAST  TECHNIQUE: Multidetector CT imaging of the chest was performed using the  standard protocol during bolus administration of intravenous contrast. Multiplanar CT image reconstructions and MIPs were obtained to evaluate the vascular anatomy.  CONTRAST:  96mL OMNIPAQUE IOHEXOL 350 MG/ML SOLN  COMPARISON:  None.  FINDINGS: There is adequate opacification of the pulmonary arteries. There is no pulmonary embolus. The main pulmonary artery, right main pulmonary artery and left main pulmonary arteries are normal in size. The heart size is normal. There is no pericardial effusion. There is coronary artery atherosclerosis involving the LAD and circumflex.  There is centrilobular emphysema. There is no focal consolidation. There is no pleural effusion or pneumothorax. There is bullous disease in the medial aspect of the anterior segment of the there is a 10 x 7 mm triangular opacity in the right middle lobe along the minor fissure. There is reticular nodular interstitial disease at the right lung base and left lung base which may be secondary to infectious or inflammatory etiology. There is bibasilar airspace disease, right greater than left.  There is no axillary, hilar, or mediastinal adenopathy.  There is mild relative esophageal wall thickening as can be seen with esophagitis.  There is no lytic or blastic osseous lesion.  The visualized portions of the upper abdomen are unremarkable.  Review of the MIP images confirms the above findings.  IMPRESSION: 1. No evidence of pulmonary embolus. 2. Bibasilar reticular nodular interstitial disease which may reflect an infectious or inflammatory etiology including atypical infection. There is bibasilar airspace disease which may reflect atelectasis versus developing pneumonia. 3. Coronary artery disease. 4. Mild relative esophageal wall thickening as can be seen with esophagitis. If there is further clinical concern recommend a barium swallow or upper endoscopy.  Electronically Signed: By: Kathreen Devoid On: 12/02/2013 11:33    Breyson Kelm Kristeen Mans,  MD  [facility] Pager:[phone]  If 7PM-7AM, please contact night-coverage [email or web address] Password [email or web address] 12/02/2013, 2:52 PM   LOS: 1 day   **Disclaimer: This note may have been dictated with voice recognition software. Similar sounding words can inadvertently be transcribed and this note may contain transcription errors which may not have been corrected upon publication of note.**

## 2013-12-02 NOTE — Progress Notes (Signed)
Utilization review completed.  

## 2013-12-02 NOTE — Progress Notes (Signed)
On call physician paged for pt's O2 sat 87%. Pt already received respiratory treatments. Pt in no s/s of distress.

## 2013-12-02 NOTE — Progress Notes (Signed)
Venti mask placed on pt per MD and respiratory.

## 2013-12-03 ENCOUNTER — Inpatient Hospital Stay (HOSPITAL_COMMUNITY): Payer: Medicare HMO

## 2013-12-03 DIAGNOSIS — E43 Unspecified severe protein-calorie malnutrition: Secondary | ICD-10-CM

## 2013-12-03 DIAGNOSIS — J9620 Acute and chronic respiratory failure, unspecified whether with hypoxia or hypercapnia: Secondary | ICD-10-CM

## 2013-12-03 DIAGNOSIS — J449 Chronic obstructive pulmonary disease, unspecified: Secondary | ICD-10-CM

## 2013-12-03 LAB — BLOOD GAS, ARTERIAL
ACID-BASE EXCESS: 14.5 mmol/L — AB (ref 0.0–2.0)
Bicarbonate: 40.8 mEq/L — ABNORMAL HIGH (ref 20.0–24.0)
Drawn by: 277331
FIO2: 0.55 %
O2 Saturation: 86.8 %
Patient temperature: 98.6
TCO2: 43.1 mmol/L (ref 0–100)
pCO2 arterial: 76.8 mmHg (ref 35.0–45.0)
pH, Arterial: 7.344 — ABNORMAL LOW (ref 7.350–7.450)
pO2, Arterial: 55.6 mmHg — ABNORMAL LOW (ref 80.0–100.0)

## 2013-12-03 LAB — CBC
HEMATOCRIT: 46.6 % — AB (ref 36.0–46.0)
HEMOGLOBIN: 14.5 g/dL (ref 12.0–15.0)
MCH: 31.3 pg (ref 26.0–34.0)
MCHC: 31.1 g/dL (ref 30.0–36.0)
MCV: 100.4 fL — AB (ref 78.0–100.0)
Platelets: 217 10*3/uL (ref 150–400)
RBC: 4.64 MIL/uL (ref 3.87–5.11)
RDW: 14.3 % (ref 11.5–15.5)
WBC: 13.2 10*3/uL — AB (ref 4.0–10.5)

## 2013-12-03 LAB — BASIC METABOLIC PANEL
BUN: 17 mg/dL (ref 6–23)
CALCIUM: 9.2 mg/dL (ref 8.4–10.5)
CHLORIDE: 99 meq/L (ref 96–112)
CO2: 37 meq/L — AB (ref 19–32)
Creatinine, Ser: 0.59 mg/dL (ref 0.50–1.10)
GFR calc Af Amer: >90 mL/min (ref >90)
GFR calc non Af Amer: >90 mL/min (ref >90)
Glucose, Bld: 142 mg/dL — ABNORMAL HIGH (ref 70–99)
Potassium: 4.5 mEq/L (ref 3.7–5.3)
Sodium: 147 mEq/L (ref 137–147)

## 2013-12-03 LAB — TROPONIN I
Troponin I: <0.3 ng/mL (ref <0.30)
Troponin I: <0.3 ng/mL (ref <0.30)

## 2013-12-03 LAB — PRO B NATRIURETIC PEPTIDE: PRO B NATRI PEPTIDE: 448.6 pg/mL — AB (ref 0–125)

## 2013-12-03 MED ORDER — FUROSEMIDE 10 MG/ML IJ SOLN
20.0000 mg | Freq: Once | INTRAMUSCULAR | Status: AC
  Administered 2013-12-03: 20 mg via INTRAVENOUS

## 2013-12-03 MED ORDER — TRAZODONE HCL 50 MG PO TABS
50.0000 mg | ORAL_TABLET | Freq: Every day | ORAL | Status: DC
  Administered 2013-12-03 – 2013-12-07 (×5): 50 mg via ORAL
  Filled 2013-12-03 (×6): qty 1

## 2013-12-03 MED ORDER — CLONAZEPAM 0.5 MG PO TABS
0.2500 mg | ORAL_TABLET | Freq: Two times a day (BID) | ORAL | Status: DC | PRN
  Filled 2013-12-03: qty 1

## 2013-12-03 MED ORDER — RISPERIDONE 1 MG PO TABS
1.0000 mg | ORAL_TABLET | Freq: Two times a day (BID) | ORAL | Status: DC
  Administered 2013-12-03 – 2013-12-08 (×10): 1 mg via ORAL
  Filled 2013-12-03 (×11): qty 1

## 2013-12-03 MED ORDER — KETOROLAC TROMETHAMINE 30 MG/ML IJ SOLN
30.0000 mg | Freq: Once | INTRAMUSCULAR | Status: AC
  Administered 2013-12-03: 30 mg via INTRAVENOUS
  Filled 2013-12-03: qty 1

## 2013-12-03 MED ORDER — FUROSEMIDE 10 MG/ML IJ SOLN
INTRAMUSCULAR | Status: AC
  Filled 2013-12-03: qty 4

## 2013-12-03 MED ORDER — GI COCKTAIL ~~LOC~~
30.0000 mL | Freq: Two times a day (BID) | ORAL | Status: DC | PRN
  Filled 2013-12-03: qty 30

## 2013-12-03 MED ORDER — MORPHINE SULFATE 2 MG/ML IJ SOLN: 1.0000 mg | INTRAMUSCULAR | Status: DC | PRN

## 2013-12-03 MED ORDER — NITROGLYCERIN 0.4 MG SL SUBL
0.4000 mg | SUBLINGUAL_TABLET | SUBLINGUAL | Status: DC | PRN
  Administered 2013-12-03 (×2): 0.4 mg via SUBLINGUAL
  Filled 2013-12-03 (×2): qty 1

## 2013-12-03 MED ORDER — FLUMAZENIL 0.5 MG/5ML IV SOLN: 0.2000 mg | Freq: Once | INTRAVENOUS | Status: DC

## 2013-12-03 NOTE — Progress Notes (Signed)
Echocardiogram 2D Echocardiogram has been performed.  Ines Bloomer 12/03/2013, 10:16 AM

## 2013-12-03 NOTE — Progress Notes (Signed)
Received pt from [facility]. Received report. Pt lethargic but easily aroused. Central telemetry notified. Pt stable. RT called for BiPap administration. Pt oriented to unit. Vitals taken. CHG bath given. Will continue to monitor.

## 2013-12-03 NOTE — Progress Notes (Signed)
Placed on BiPap 12/5 per MD order.

## 2013-12-03 NOTE — Progress Notes (Signed)
Mask secure. No breakdown on face.

## 2013-12-03 NOTE — Progress Notes (Signed)
Respiratory consulted, changing patient to high flow [place]. Lasix 20 mg IV given. Will continue to monitor. Joslyn Hy, MSN, RN, [employer]

## 2013-12-03 NOTE — Progress Notes (Signed)
RT tried pt on 50% VM sats 88. Pts HR increased to 130s. RT placed pt back on bipap 14/5,40. HR decreased to 70s. Pt in no distress. Pt tol well. Mask secure.
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
## 2013-12-03 NOTE — Progress Notes (Signed)
PATIENT DETAILS Name: Caroline Hill Age: 68 y.o. Sex: female Date of Birth: 17-Feb-1946 Admit Date: 12/01/2013 Admitting Physician Kinnie Feil, MD [id number] C, MD  Subjective: Chest pain this am-relieved with Nitro X 2, more hypoxic than yesterday-on 100 NRB  Assessment/Plan: Principal Problem: Acute on Chronic Hypoxic Resp Failure -secondary to COPD exac,CT Angio Chest negative for Pul Embolism, however more hypoxic than yesterday-check ABG and Stat CXE. May need BiPAP if no improvement-therefore will move to SDU. May need Pul consult -continue too treat with Solumedrol,nebs, Levaquin  COPD exacerbation -more hypoxic, was in some distress this am from chest pain-still wheezing -c/w Solumedrol, Nebs and empiric Levaquin  CAP -seen on CT Chest -on Levaquin-day 3 -afebrile, non toxic looking. Mild leukocytosis-likely from steroids  Chest Pain -occurred this am-described it like "pressure"-retrosternal, relieved by sublingual nitroglycerin -EKG with no acute changes, cycle troponins-has hx of CAD, will consult Cards   Sinus Tachycardia -suspect secondary to anxiety,albuterol nebs. CT Angio chest neg. -cautiously continue with Metoprolol for now. If no improvement in COPD exac-will need to stop Metoprolol    HTN (hypertension) - Blood pressure soft- stop benazepril, continue cautiously with amlodipine and metoprolol with holding parameters. Follow for now.  Anxiety - Continue Klonopin.  History of CAD -chest pain this am as above-await trops - Continue with aspirin, beta blockers and statins.   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  MEDICATIONS: Scheduled Meds: . amLODipine  10 mg Oral Daily  . antiseptic oral rinse  15 mL Mouth Rinse q12n4p  . aspirin EC  81 mg Oral Q breakfast  . atorvastatin  10 mg Oral q1800  . chlorhexidine  15 mL Mouth Rinse BID  . cholecalciferol  5,000 Units Oral Daily  . clonazePAM  0.25 mg Oral BID AC  . clonazePAM  0.5 mg Oral QHS  . enoxaparin (LOVENOX) injection  40 mg Subcutaneous Q24H  . feeding supplement (ENSURE COMPLETE)  237 mL Oral BID BM  . FLUoxetine  60 mg Oral Daily  . guaiFENesin  600 mg Oral BID  . ipratropium  0.5 mg Nebulization Q6H  . lamoTRIgine  100 mg Oral Q breakfast  . levalbuterol  0.63 mg Nebulization Q6H  . levofloxacin (LEVAQUIN) IV  500 mg Intravenous Q24H  . methylPREDNISolone (SOLU-MEDROL) injection  80 mg Intravenous 3 times per day  . metoprolol succinate  25 mg Oral Q breakfast  . nicotine  21 mg Transdermal Daily  . pantoprazole  40 mg Oral Q1200  . risperiDONE  2 mg Oral BID  . sodium chloride  3 mL Intravenous Q12H  . traZODone  100 mg Oral QHS  . vitamin B-12  5,000 mcg Oral Q breakfast   Continuous Infusions: . sodium chloride 10 mL/hr (12/02/13 0933)   PRN Meds:.acetaminophen, acetaminophen, benzonatate, gi cocktail, HYDROcodone-acetaminophen, morphine injection, nitroGLYCERIN, ondansetron (ZOFRAN) IV, ondansetron  Antibiotics: Anti-infectives   Start     Dose/Rate  Route Frequency Ordered Stop   12/01/13 1615  levofloxacin (LEVAQUIN) IVPB 500 mg     500 mg 100 mL/hr over 60 Minutes Intravenous Every 24 hours 12/01/13 1602         PHYSICAL EXAM: Vital signs in last 24 hours: Filed Vitals:   12/03/13 0342 12/03/13  0901 12/03/13 0908 12/03/13 0930  BP: 131/71 155/76    Pulse: 79 91    Temp: 97.8 F (36.6 C) 98.1 F (36.7 C)    TempSrc: Oral Oral    Resp: 16 24    Height:      Weight:      SpO2: 92% 84% 85% 89%    Weight change:  Filed Weights   12/01/13 1316 12/01/13 1710  Weight: 49.442 kg (109 lb) 48.988 kg (108 lb)   Body mass index is 17.17 kg/(m^2).   Gen Exam: Awake and alert with clear speech.  In mildly distress-tremulous Neck: Supple, No JVD.   Chest: Decreased air entry B/L-still with coarse rhonchi CVS: S1 S2 Regular, tachycardic Abdomen: soft, BS +, non tender, non distended.  Extremities: no edema, lower extremities warm to touch. Neurologic: Non Focal.   Skin: No Rash.   Wounds: N/A.    Intake/Output from previous day:  Intake/Output Summary (Last 24 hours) at 12/03/13 1000 Last data filed at 12/02/13 1900  Gross per 24 hour  Intake 1380.75 ml  Output      0 ml  Net 1380.75 ml     LAB RESULTS: CBC  Recent Labs Lab 12/01/13 1323 12/03/13 0538  WBC 7.5 13.2*  HGB 15.7* 14.5  HCT 49.1* 46.6*  PLT 180 217  MCV 99.4 100.4*  MCH 31.8 31.3  MCHC 32.0 31.1  RDW 14.0 14.3  LYMPHSABS 1.1  --   MONOABS 0.8  --   EOSABS 0.0  --   BASOSABS 0.0  --     Chemistries   Recent Labs Lab 12/01/13 1323 12/02/13 0750 12/03/13 0538  NA 146 143 147  K 3.8 4.1 4.5  CL 96 98 99  CO2 42* 38* 37*  GLUCOSE 101* 154* 142*  BUN 23 20 17   CREATININE 0.60 0.55 0.59  CALCIUM 9.6 9.2 9.2    CBG: No results found for this basename: GLUCAP,  in the last 168 hours  GFR Estimated Creatinine Clearance: 52.8 ml/min (by C-G formula based on Cr of 0.59).  Coagulation profile No results found for this basename: INR, PROTIME,  in the last 168 hours  Cardiac Enzymes  Recent Labs Lab 12/01/13 1323  TROPONINI <0.30    No components found with this basename: POCBNP,  No results found for this basename: DDIMER,  in the last 72 hours No results found for this  basename: HGBA1C,  in the last 72 hours No results found for this basename: CHOL, HDL, LDLCALC, TRIG, CHOLHDL, LDLDIRECT,  in the last 72 hours No results found for this basename: TSH, T4TOTAL, FREET3, T3FREE, THYROIDAB,  in the last 72 hours No results found for this basename: VITAMINB12, FOLATE, FERRITIN, TIBC, IRON, RETICCTPCT,  in the last 72 hours No results found for this basename: LIPASE, AMYLASE,  in the last 72 hours  Urine Studies No results found for this basename: UACOL, UAPR, USPG, UPH, UTP, UGL, UKET, UBIL, UHGB, UNIT, UROB, ULEU, UEPI, UWBC, URBC, UBAC, CAST, CRYS, UCOM, BILUA,  in the last 72 hours  MICROBIOLOGY: No results found for this or any previous visit (from the past 240 hour(s)).  RADIOLOGY STUDIES/RESULTS: Dg Chest  2 View  12/01/2013   CLINICAL DATA:  Shortness of breath, cough and congestion with history of COPD, coronary artery disease, and tobacco use  EXAM: CHEST  2 VIEW  COMPARISON:  CT scan of the chest dated August 17, 2011.  FINDINGS: The lungs are mildly hyperinflated. The interstitial markings are coarse bilaterally. There is subtle nodular density that projects just lateral to the right main descending pulmonary artery on the frontal view which is not clearly demonstrated on the lateral film. This may correspond to the previously demonstrated nodule in the right middle lobe. The cardiopericardial silhouette is normal in size. The pulmonary vascularity is not engorged. There is no pleural effusion or pneumothorax. The observed portions of the bony thorax exhibit no acute abnormalities.  IMPRESSION: There is hyperinflation consistent with COPD. There is no evidence of pneumonia nor CHF. There is subtle nodular density in the right mid to lower lung on the frontal film which may correspond to previously demonstrated nodule on the CT scan dated August 17, 2011. Follow-up noncontrast CT scanning is recommended to evaluate this area of nodularity for stability or  change.   Electronically Signed   By: David  Martinique   On: 12/01/2013 14:02   Ct Angio Chest Pe W/cm &/or Wo Cm  12/02/2013   ADDENDUM REPORT: 12/02/2013 11:45  ADDENDUM: Impression Number 5 as follows:  5. Triangular opacity in the right middle lobe along the minor fissure unchanged compared with 08/17/2011.   Electronically Signed   By: Kathreen Devoid   On: 12/02/2013 11:45   12/02/2013   CLINICAL DATA:  Upper chest pain, productive cough  EXAM: CT ANGIOGRAPHY CHEST WITH CONTRAST  TECHNIQUE: Multidetector CT imaging of the chest was performed using the standard protocol during bolus administration of intravenous contrast. Multiplanar CT image reconstructions and MIPs were obtained to evaluate the vascular anatomy.  CONTRAST:  65mL OMNIPAQUE IOHEXOL 350 MG/ML SOLN  COMPARISON:  None.  FINDINGS: There is adequate opacification of the pulmonary arteries. There is no pulmonary embolus. The main pulmonary artery, right main pulmonary artery and left main pulmonary arteries are normal in size. The heart size is normal. There is no pericardial effusion. There is coronary artery atherosclerosis involving the LAD and circumflex.  There is centrilobular emphysema. There is no focal consolidation. There is no pleural effusion or pneumothorax. There is bullous disease in the medial aspect of the anterior segment of the there is a 10 x 7 mm triangular opacity in the right middle lobe along the minor fissure. There is reticular nodular interstitial disease at the right lung base and left lung base which may be secondary to infectious or inflammatory etiology. There is bibasilar airspace disease, right greater than left.  There is no axillary, hilar, or mediastinal adenopathy.  There is mild relative esophageal wall thickening as can be seen with esophagitis.  There is no lytic or blastic osseous lesion.  The visualized portions of the upper abdomen are unremarkable.  Review of the MIP images confirms the above findings.   IMPRESSION: 1. No evidence of pulmonary embolus. 2. Bibasilar reticular nodular interstitial disease which may reflect an infectious or inflammatory etiology including atypical infection. There is bibasilar airspace disease which may reflect atelectasis versus developing pneumonia. 3. Coronary artery disease. 4. Mild relative esophageal wall thickening as can be seen with esophagitis. If there is further clinical concern recommend a barium swallow or upper endoscopy.  Electronically Signed: By: Kathreen Devoid On: 12/02/2013 11:33    Ware,  MD  [facility] Pager:[phone]  If 7PM-7AM, please contact night-coverage [email or web address] Password [email or web address] 12/03/2013, 10:00 AM   LOS: 2 days   **Disclaimer: This note may have been dictated with voice recognition software. Similar sounding words can inadvertently be transcribed and this note may contain transcription errors which may not have been corrected upon publication of note.**

## 2013-12-03 NOTE — Progress Notes (Signed)
CRITICAL VALUE ALERT  Critical value received:  CO2 77  Date of notification:  5/21  Time of notification:  6767  Critical value read back:yes  Nurse who received alert:  Rip Harbour  MD notified (1st page):  Ghimire  Time of first page:  39  MD notified (2nd page):  Time of second page:  Responding MD:  [id number]  Time MD responded:  1010

## 2013-12-03 NOTE — Progress Notes (Signed)
Pt awake and can follow commands. RT placed pt on 4L [place]. Pt tol well. Pt in no distress

## 2013-12-03 NOTE — Progress Notes (Signed)
**Note De-Identified  Obfuscation** RT assessment: RT responded per RN request for desaturation on 55% VM. Patient placed on 100% NRB and collected ABG. RRT to continue to monitor.

## 2013-12-04 DIAGNOSIS — G9340 Encephalopathy, unspecified: Secondary | ICD-10-CM

## 2013-12-04 LAB — COMPREHENSIVE METABOLIC PANEL
ALBUMIN: 2.8 g/dL — AB (ref 3.5–5.2)
ALT: 14 U/L (ref 0–35)
AST: 13 U/L (ref 0–37)
Alkaline Phosphatase: 66 U/L (ref 39–117)
BUN: 29 mg/dL — ABNORMAL HIGH (ref 6–23)
CO2: 40 mEq/L (ref 19–32)
CREATININE: 0.6 mg/dL (ref 0.50–1.10)
Calcium: 9.4 mg/dL (ref 8.4–10.5)
Chloride: 93 mEq/L — ABNORMAL LOW (ref 96–112)
GFR calc Af Amer: >90 mL/min (ref >90)
GFR calc non Af Amer: >90 mL/min (ref >90)
Glucose, Bld: 190 mg/dL — ABNORMAL HIGH (ref 70–99)
Potassium: 4.3 mEq/L (ref 3.7–5.3)
Sodium: 141 mEq/L (ref 137–147)
Total Bilirubin: 0.3 mg/dL (ref 0.3–1.2)
Total Protein: 6.1 g/dL (ref 6.0–8.3)

## 2013-12-04 LAB — CBC
HEMATOCRIT: 47.9 % — AB (ref 36.0–46.0)
Hemoglobin: 15.3 g/dL — ABNORMAL HIGH (ref 12.0–15.0)
MCH: 31.4 pg (ref 26.0–34.0)
MCHC: 31.9 g/dL (ref 30.0–36.0)
MCV: 98.2 fL (ref 78.0–100.0)
PLATELETS: 212 10*3/uL (ref 150–400)
RBC: 4.88 MIL/uL (ref 3.87–5.11)
RDW: 14.2 % (ref 11.5–15.5)
WBC: 8.7 10*3/uL (ref 4.0–10.5)

## 2013-12-04 LAB — MRSA PCR SCREENING: MRSA BY PCR: NEGATIVE

## 2013-12-04 MED ORDER — METHYLPREDNISOLONE SODIUM SUCC 40 MG IJ SOLR
40.0000 mg | Freq: Three times a day (TID) | INTRAMUSCULAR | Status: DC
  Administered 2013-12-05 – 2013-12-07 (×8): 40 mg via INTRAVENOUS
  Filled 2013-12-04 (×13): qty 1

## 2013-12-04 NOTE — Progress Notes (Addendum)
   Name: Caroline Hill MRN: [id number] DOB: 03-30-1946    ADMISSION DATE:  12/01/2013 CONSULTATION DATE:  12/03/13  REFERRING MD :  Dr. Sherral Hammers PRIMARY SERVICE:  [facility]   CHIEF COMPLAINT:  Respiratory Failure  BRIEF PATIENT DESCRIPTION: 68 y/o M with PMH oxygen dependent COPD admitted on 5/19 with progressive SOB, weakness, cough with productive yellow sputum.  Admitted for AECOPD per [facility]. 5/21 decompensated with mental status changes in setting of hypercarbia.  DNR.  PCCM consulted for evaluation.   PMH of HTN, Bipolar Disorder, Tobacco Abuse, Marijuana Abuse, CAD, Arthritis & oxygen dependent COPD   SIGNIFICANT EVENTS / STUDIES:  5/19 - Admit with COPD exacerbation  5/21 - Mental status changes in setting of hypercarbia, PCCM consulted    CULTURES:   ANTIBIOTICS: Levaquin 5/19 >>   SUBJECTIVE:breathing better' On highflow cannula  Off bipap overnight  VITAL SIGNS: Temp:  [97.7 F (36.5 C)-98.8 F (37.1 C)] 98.6 F (37 C) (05/22 1100) Pulse Rate:  [70-91] 82 (05/22 1417) Resp:  [15-28] 20 (05/22 1417) BP: (116-173)/(55-81) 116/55 mmHg (05/22 1100) SpO2:  [82 %-93 %] 90 % (05/22 1417) FiO2 (%):  [40 %-60 %] 45 % (05/22 1417)  PHYSICAL EXAMINATION: General:  Chronically ill in NAD Neuro:  Awake alert, interactive, non focal HEENT:  Mm pink/moist, no lesions  Cardiovascular:  s1s2 rrr, no m/r/g Lungs:  resp's even/non-labored, lungs bilaterally distant but clear, no bronchospasm Abdomen:  Round/soft, bsx4 active  Musculoskeletal:  No acute deformities  Skin:  Warm/dry, no edema    Recent Labs Lab 12/02/13 0750 12/03/13 0538 12/04/13 1015  NA 143 147 141  K 4.1 4.5 4.3  CL 98 99 93*  CO2 38* 37* 40*  BUN 20 17 29*  CREATININE 0.55 0.59 0.60  GLUCOSE 154* 142* 190*    Recent Labs Lab 12/01/13 1323 12/03/13 0538 12/04/13 1015  HGB 15.7* 14.5 15.3*  HCT 49.1* 46.6* 47.9*  WBC 7.5 13.2* 8.7  PLT 180 217 212   Dg Chest Port 1 View  12/03/2013   CLINICAL  DATA:  Shortness of breath. History of coronary artery disease with angioplasty and stent placement.  EXAM: PORTABLE CHEST - 1 VIEW  COMPARISON:  CT scan of the chest dated 02 Dec 2013 and chest x-ray of Dec 01, 2013  FINDINGS: Mild stable hyperinflation of both lungs. Coarse interstitial markings in the lower lobes bilaterally are more conspicuous today. Cardiac silhouette and pulmonary vascularity within the limits of normal. No pleural effusion or pneumothorax. Gentle mid to lower thoracic levoscoliosis is stable.  IMPRESSION: COPD with bibasilar subsegmental atelectasis or early interstitial pneumonia. The findings are more conspicuous than on the previous chest x-ray.   Electronically Signed   By: David  Martinique   On: 12/03/2013 10:54    ASSESSMENT / PLAN:  Acute Exacerbation of COPD Tobacco / Marijuana Abuse Acute Hypercarbic Respiratory Failure  Plan: -can switch from hiflo to regular [place] -hold scheduled klonopin, change to BID + HS PRN -reduce risperdal to 1mg  BID -reduce trazodone to 50 mg QHS -d/c morphine, PRN vicodin -rapid taper of steroids to off  -Bronchodilators  Can transfer to floor Pl arrange FU with [facility] on discharge PCCm available as needed   Kara Mead MD. Shade Flood. [facility] Pager [phone] If no response call [phone]     12/04/2013, 2:31 PM

## 2013-12-04 NOTE — Progress Notes (Signed)
UR Completed Caroline Christon Graves-Bigelow, RN,BSN [phone]  

## 2013-12-04 NOTE — Progress Notes (Signed)
PATIENT DETAILS Name: Caroline Hill Age: 68 y.o. Sex: female Date of Birth: 06-Jul-1946 Admit Date: 12/01/2013 Admitting Physician Kinnie Feil, MD NOM:[id number] C, MD  Subjective: Much more awake and alert-but still requiring High Low O2 to maintain sats.Feels better.  Assessment/Plan: Principal Problem: Acute on Chronic Hypoxic Resp Failure -secondary to COPD exac,CT Angio Chest negative for Pul Embolism.  [facility] course complicated by development of worsening Hypoxia and Hypecarbia, transferred to SDU and briefly needed BiPAP on 5/21, now on High Flow O2. Appreciate Pul consult -continue to treat with Solumedrol,nebs, Levaquin  Acute Metabolic Encephalopathy -from CO2 narcosis -resolved with BiPAP  COPD exacerbation -still much more hypoxic than baseline-requiring high Flow O2-40%fio2- to maintain sats, much better than yesterday-with less wheezing -c/w Solumedrol, Nebs and empiric Levaquin  CAP -seen on CT Chest -on Levaquin-day 4 -afebrile, non toxic looking. Mild leukocytosis-likely from steroids  Chest Pain -occurred on 5/21-described it like "pressure"-retrosternal, relieved by sublingual nitroglycerin -EKG with no acute changes,  troponins cycled-negative -has known hx of CAD-c/w ASA, beta blocker on hold due to COPD exac  Sinus Tachycardia -suspect secondary to anxiety,albuterol nebs. CT Angio chest neg.Better but intermittently continues  HTN (hypertension) - Blood pressure soft-but much better than the past few days-monitor off anti-hypertensives for now.  Anxiety - Continue Klonopin-dose adjusted by PCCM for hypercarbia  History of CAD -chest pain this am as above-await trops - Continue with aspirin, beta blockers and statins.   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  MEDICATIONS: Scheduled Meds: . antiseptic oral rinse  15 mL Mouth Rinse q12n4p  . aspirin EC  81 mg Oral Q breakfast  . atorvastatin  10 mg Oral q1800  . chlorhexidine  15 mL Mouth Rinse BID  . cholecalciferol  5,000 Units Oral Daily  . clonazePAM  0.5 mg Oral QHS  . enoxaparin (LOVENOX) injection  40 mg Subcutaneous Q24H  . feeding supplement (ENSURE COMPLETE)  237 mL Oral BID BM  . FLUoxetine  60 mg Oral Daily  . guaiFENesin  600 mg Oral BID  . ipratropium  0.5 mg Nebulization Q6H  . lamoTRIgine  100 mg Oral Q breakfast  . levalbuterol  0.63 mg Nebulization Q6H  . levofloxacin (LEVAQUIN) IV  500 mg Intravenous Q24H  . methylPREDNISolone (SOLU-MEDROL) injection  80 mg Intravenous 3 times per day  . nicotine  21 mg Transdermal Daily  . pantoprazole  40 mg Oral Q1200  . risperiDONE  1 mg Oral BID  . sodium chloride  3 mL Intravenous Q12H  . traZODone  50 mg Oral QHS  . vitamin B-12  5,000 mcg Oral Q breakfast   Continuous Infusions: . sodium chloride Stopped (12/04/13 0500)   PRN Meds:.acetaminophen, acetaminophen, benzonatate, clonazePAM, gi cocktail, HYDROcodone-acetaminophen, nitroGLYCERIN, ondansetron (ZOFRAN) IV, ondansetron  Antibiotics: Anti-infectives   Start     Dose/Rate Route Frequency Ordered Stop   12/01/13 1615  levofloxacin (LEVAQUIN) IVPB 500 mg     500 mg 100 mL/hr over 60 Minutes Intravenous Every 24 hours 12/01/13 1602         PHYSICAL EXAM: Vital signs in last 24 hours: Filed  Vitals:   12/03/13 2312 12/04/13 0238 12/04/13 0310 12/04/13 0718  BP: 129/67  129/70 131/56  Pulse: 76 79 71 70  Temp: 97.7 F (36.5 C)  98.8 F (37.1 C) 98.2 F (36.8 C)  TempSrc: Axillary  Oral Oral  Resp: 26 24 19 19   Height:      Weight:      SpO2: 91% 90% 88% 87%    Weight change:  Filed Weights   12/01/13 1316 12/01/13 1710 12/03/13 1254  Weight: 49.442 kg (109 lb) 48.988 kg (108 lb) 53.3 kg (117 lb 8.1 oz)   Body mass index is 21.49 kg/(m^2).   Gen Exam: Awake and alert with clear speech.  In mildly distress-tremulous Neck: Supple, No JVD.   Chest: Decreased air entry B/L-only some scattered rhonchi heared CVS: S1 S2 Regular, tachycardic Abdomen: soft, BS +, non tender, non distended.  Extremities: no edema, lower extremities warm to touch. Neurologic: Non Focal.   Skin: No Rash.   Wounds: N/A.    Intake/Output from previous day:  Intake/Output Summary (Last 24 hours) at 12/04/13 0845 Last data filed at 12/04/13 8338  Gross per 24 hour  Intake    880 ml  Output    500 ml  Net    380 ml     LAB RESULTS: CBC  Recent Labs Lab 12/01/13 1323 12/03/13 0538  WBC 7.5 13.2*  HGB 15.7* 14.5  HCT 49.1* 46.6*  PLT 180 217  MCV 99.4 100.4*  MCH 31.8 31.3  MCHC 32.0 31.1  RDW 14.0 14.3  LYMPHSABS 1.1  --   MONOABS 0.8  --   EOSABS 0.0  --   BASOSABS 0.0  --     Chemistries   Recent Labs Lab 12/01/13 1323 12/02/13 0750 12/03/13 0538  NA 146 143 147  K 3.8 4.1 4.5  CL 96 98 99  CO2 42* 38* 37*  GLUCOSE 101* 154* 142*  BUN 23 20 17   CREATININE 0.60 0.55 0.59  CALCIUM 9.6 9.2 9.2    CBG: No results found for this basename: GLUCAP,  in the last 168 hours  GFR Estimated Creatinine Clearance: 54 ml/min (by C-G formula based on Cr of 0.59).  Coagulation profile No results found for this basename: INR, PROTIME,  in the last 168 hours  Cardiac Enzymes  Recent Labs Lab 12/03/13 1025 12/03/13 1426 12/03/13 2112  TROPONINI <0.30 <0.30  <0.30    No components found with this basename: POCBNP,  No results found for this basename: DDIMER,  in the last 72 hours No results found for this basename: HGBA1C,  in the last 72 hours No results found for this basename: CHOL, HDL, LDLCALC, TRIG, CHOLHDL, LDLDIRECT,  in the last 72 hours No results found for this basename: TSH, T4TOTAL, FREET3, T3FREE, THYROIDAB,  in the last 72 hours No results found for this basename: VITAMINB12, FOLATE, FERRITIN, TIBC, IRON, RETICCTPCT,  in the last 72 hours No results found for this basename: LIPASE, AMYLASE,  in the last 72 hours  Urine Studies No results found for this basename: UACOL, UAPR, USPG, UPH, UTP, UGL, UKET, UBIL, UHGB, UNIT, UROB, ULEU, UEPI, UWBC, URBC, UBAC, CAST, CRYS, UCOM, BILUA,  in the last 72 hours  MICROBIOLOGY: Recent Results (from the past 240  hour(s))  MRSA PCR SCREENING     Status: None   Collection Time    12/04/13  5:34 AM      Result Value Ref Range Status   MRSA by PCR NEGATIVE  NEGATIVE Final   Comment:            The GeneXpert MRSA Assay (FDA     approved for NASAL specimens     only), is one component of a     comprehensive MRSA colonization     surveillance program. It is not     intended to diagnose MRSA     infection nor to guide or     monitor treatment for     MRSA infections.    RADIOLOGY STUDIES/RESULTS: Dg Chest 2 View  12/01/2013   CLINICAL DATA:  Shortness of breath, cough and congestion with history of COPD, coronary artery disease, and tobacco use  EXAM: CHEST  2 VIEW  COMPARISON:  CT scan of the chest dated August 17, 2011.  FINDINGS: The lungs are mildly hyperinflated. The interstitial markings are coarse bilaterally. There is subtle nodular density that projects just lateral to the right main descending pulmonary artery on the frontal view which is not clearly demonstrated on the lateral film. This may correspond to the previously demonstrated nodule in the right middle lobe. The  cardiopericardial silhouette is normal in size. The pulmonary vascularity is not engorged. There is no pleural effusion or pneumothorax. The observed portions of the bony thorax exhibit no acute abnormalities.  IMPRESSION: There is hyperinflation consistent with COPD. There is no evidence of pneumonia nor CHF. There is subtle nodular density in the right mid to lower lung on the frontal film which may correspond to previously demonstrated nodule on the CT scan dated August 17, 2011. Follow-up noncontrast CT scanning is recommended to evaluate this area of nodularity for stability or change.   Electronically Signed   By: David  Martinique   On: 12/01/2013 14:02   Ct Angio Chest Pe W/cm &/or Wo Cm  12/02/2013   ADDENDUM REPORT: 12/02/2013 11:45  ADDENDUM: Impression Number 5 as follows:  5. Triangular opacity in the right middle lobe along the minor fissure unchanged compared with 08/17/2011.   Electronically Signed   By: Kathreen Devoid   On: 12/02/2013 11:45   12/02/2013   CLINICAL DATA:  Upper chest pain, productive cough  EXAM: CT ANGIOGRAPHY CHEST WITH CONTRAST  TECHNIQUE: Multidetector CT imaging of the chest was performed using the standard protocol during bolus administration of intravenous contrast. Multiplanar CT image reconstructions and MIPs were obtained to evaluate the vascular anatomy.  CONTRAST:  32mL OMNIPAQUE IOHEXOL 350 MG/ML SOLN  COMPARISON:  None.  FINDINGS: There is adequate opacification of the pulmonary arteries. There is no pulmonary embolus. The main pulmonary artery, right main pulmonary artery and left main pulmonary arteries are normal in size. The heart size is normal. There is no pericardial effusion. There is coronary artery atherosclerosis involving the LAD and circumflex.  There is centrilobular emphysema. There is no focal consolidation. There is no pleural effusion or pneumothorax. There is bullous disease in the medial aspect of the anterior segment of the there is a 10 x 7 mm  triangular opacity in the right middle lobe along the minor fissure. There is reticular nodular interstitial disease at the right lung base and left lung base which may be secondary to infectious or inflammatory etiology. There is bibasilar airspace disease, right greater than left.  There is  no axillary, hilar, or mediastinal adenopathy.  There is mild relative esophageal wall thickening as can be seen with esophagitis.  There is no lytic or blastic osseous lesion.  The visualized portions of the upper abdomen are unremarkable.  Review of the MIP images confirms the above findings.  IMPRESSION: 1. No evidence of pulmonary embolus. 2. Bibasilar reticular nodular interstitial disease which may reflect an infectious or inflammatory etiology including atypical infection. There is bibasilar airspace disease which may reflect atelectasis versus developing pneumonia. 3. Coronary artery disease. 4. Mild relative esophageal wall thickening as can be seen with esophagitis. If there is further clinical concern recommend a barium swallow or upper endoscopy.  Electronically Signed: By: Kathreen Devoid On: 12/02/2013 11:33    Daysi Boggan Kristeen Mans, MD  [facility] Pager:[phone]  If 7PM-7AM, please contact night-coverage [email or web address] Password [email or web address] 12/04/2013, 8:45 AM   LOS: 3 days   **Disclaimer: This note may have been dictated with voice recognition software. Similar sounding words can inadvertently be transcribed and this note may contain transcription errors which may not have been corrected upon publication of note.**

## 2013-12-04 NOTE — Progress Notes (Signed)
Pt transferred to 5W11 via bed with all belongings. Report given to Joellen Jersey, Therapist, sports. All questions answered.

## 2013-12-04 NOTE — Progress Notes (Signed)
NURSING PROGRESS NOTE  Caroline Hill [id number] Transfer Data: 12/04/2013 5:29 PM Attending Provider: Jonetta Osgood, MD ZTI:[id number] C, MD Code Status: DNR  Caroline Hill is a 68 y.o. female patient transferred from [place]   -No acute distress noted.  -No complaints of shortness of breath.  -No complaints of chest pain.   Cardiac Monitoring: Box # 11 in place.   Blood pressure 162/83, pulse 76, temperature 98.2 F (36.8 C), temperature source Oral, resp. rate 20, height 5\' 7"  (1.702 m), weight 54.6 kg (120 lb 5.9 oz), SpO2 91.00%.   Allergies:  Oxycontin  Past Medical History:   has a past medical history of Hypertension; Bipolar disorder, unspecified; Unspecified constipation; Other malaise and fatigue; Unspecified vitamin D deficiency; Tobacco use disorder; Hyperlipidemia; COPD (chronic obstructive pulmonary disease); Shortness of breath; Neuromuscular disorder; Depression; CAD (coronary artery disease); On home oxygen therapy; Arthritis; and Schizophrenia.  Past Surgical History:   has past surgical history that includes Hand surgery (Bilateral, ~ 1961); Appendectomy; Abdominal exploration surgery (~ 1967); Inguinal hernia repair (11/16/2011); transthoracic echocardiogram (09/2011); nm myocar perf wall motion (09/2011); Carotid Doppler (10/2012); Tonsillectomy; Inguinal hernia repair; Coronary angioplasty (09/11/1999); Coronary angioplasty with stent; and Vaginal hysterectomy.  Social History:   reports that she quit smoking 8 days ago. Her smoking use included Cigarettes. She has a 25.5 pack-year smoking history. She has never used smokeless tobacco. She reports that she uses illicit drugs (Marijuana) about 7 times per week. She reports that she does not drink alcohol.  Skin: Some bruising noted to arms, otherwise, intact.  Patient/Family orientated to room. Information packet given to patient/family. Admission inpatient armband information verified with patient/family to  include name and date of birth and placed on patient arm. Side rails up x 2, fall assessment and education completed with patient/family. Patient/family able to verbalize understanding of risk associated with falls and verbalized understanding to call for assistance before getting out of bed. Call light within reach. Patient/family able to voice and demonstrate understanding of unit orientation instructions.    Will continue to evaluate and treat per MD orders.

## 2013-12-05 ENCOUNTER — Inpatient Hospital Stay (HOSPITAL_COMMUNITY): Payer: Medicare HMO

## 2013-12-05 LAB — BASIC METABOLIC PANEL
BUN: 20 mg/dL (ref 6–23)
CALCIUM: 9.1 mg/dL (ref 8.4–10.5)
CO2: 44 mEq/L (ref 19–32)
Chloride: 95 mEq/L — ABNORMAL LOW (ref 96–112)
Creatinine, Ser: 0.67 mg/dL (ref 0.50–1.10)
GFR calc Af Amer: >90 mL/min (ref >90)
GFR, EST NON AFRICAN AMERICAN: 89 mL/min — AB (ref >90)
GLUCOSE: 94 mg/dL (ref 70–99)
Potassium: 3.4 mEq/L — ABNORMAL LOW (ref 3.7–5.3)
SODIUM: 143 meq/L (ref 137–147)

## 2013-12-05 LAB — CBC
HCT: 44.3 % (ref 36.0–46.0)
Hemoglobin: 13.8 g/dL (ref 12.0–15.0)
MCH: 30.1 pg (ref 26.0–34.0)
MCHC: 31.2 g/dL (ref 30.0–36.0)
MCV: 96.5 fL (ref 78.0–100.0)
PLATELETS: 219 10*3/uL (ref 150–400)
RBC: 4.59 MIL/uL (ref 3.87–5.11)
RDW: 14.2 % (ref 11.5–15.5)
WBC: 10.3 10*3/uL (ref 4.0–10.5)

## 2013-12-05 LAB — PRO B NATRIURETIC PEPTIDE: PRO B NATRI PEPTIDE: 417.9 pg/mL — AB (ref 0–125)

## 2013-12-05 MED ORDER — POTASSIUM CHLORIDE CRYS ER 20 MEQ PO TBCR
40.0000 meq | EXTENDED_RELEASE_TABLET | Freq: Once | ORAL | Status: AC
  Administered 2013-12-05: 40 meq via ORAL
  Filled 2013-12-05: qty 2

## 2013-12-05 MED ORDER — DILTIAZEM HCL 30 MG PO TABS
30.0000 mg | ORAL_TABLET | Freq: Three times a day (TID) | ORAL | Status: DC
  Administered 2013-12-05 – 2013-12-08 (×10): 30 mg via ORAL
  Filled 2013-12-05 (×12): qty 1

## 2013-12-05 MED ORDER — FUROSEMIDE 10 MG/ML IJ SOLN
20.0000 mg | Freq: Once | INTRAMUSCULAR | Status: AC
  Administered 2013-12-05: 20 mg via INTRAVENOUS
  Filled 2013-12-05: qty 2

## 2013-12-05 NOTE — Progress Notes (Addendum)
Heart rate in 140-150's, while patient resting. Notified MD.   Will continue to monitor closely.

## 2013-12-05 NOTE — Progress Notes (Signed)
Physical Therapy Evaluation Patient Details Name: Caroline Hill   History of Present Illness  Patient is a 68 yo female admitted 12/01/13 with dyspnea, COPD exacerbation, hypoxia (BiPAP x 1 day), metabolic encephalopathy, tachycardia.  Patient with h/o HTN, COPD, anxiety, CAD, bipolar disorder.  Clinical Impression  Patient presents with problems listed below.  Will benefit from acute PT to maximize independence prior to discharge home with family.    Follow Up Recommendations Home health PT;Supervision/Assistance - 24 hour    Equipment Recommendations  Rolling walker with 5" wheels    Recommendations for Other Services       Precautions / Restrictions Precautions Precautions: Fall Precaution Comments: On O2 at home at night - 3 l/min Restrictions Weight Bearing Restrictions: No      Mobility  Bed Mobility Overal bed mobility: Modified Independent             General bed mobility comments: Patient able to move to EOB with use of bed rail only.  Transfers Overall transfer level: Needs assistance Equipment used: Rolling walker (2 wheeled) Transfers: Sit to/from Stand Sit to Stand: Min guard         General transfer comment: Verbal cues for hand placement.  Assist for safety/balance.  Ambulation/Gait Ambulation/Gait assistance: Min guard Ambulation Distance (Feet): 160 Feet Assistive device: Rolling walker (2 wheeled) Gait Pattern/deviations: Step-through pattern;Decreased step length - right;Decreased step length - left;Decreased stride length;Trunk flexed (Shoulders elevated) Gait velocity: Decreased Gait velocity interpretation: Below normal speed for age/gender General Gait Details: Verbal cues for safe use of RW, especially in turns.  Cues to stand upright, and relax shoulders.  Patient with slow guarded gait.  Required 2 standing rest breaks during gait.  Ambulated with O2 on.  Stairs             Wheelchair Mobility    Modified Rankin (Stroke Patients Only)       Balance Overall balance assessment: Needs assistance         Standing balance support: Single extremity supported Standing balance-Leahy Scale: Fair Standing balance comment: Unsteady due to weakness, and with gait.                             Pertinent Vitals/Pain     Home Living Family/patient expects to be discharged to:: Private residence Living Arrangements: Other relatives;Non-relatives/Friends (brother and friend) Available Help at Discharge: Family;Friend(s);Available 24 hours/day Type of Home: House Home Access: Stairs to enter Entrance Stairs-Rails: Psychiatric nurse of Steps: 3 Home Layout: One level Home Equipment: Cane - single point      Prior Function Level of Independence: Independent;Needs assistance   Gait / Transfers Assistance Needed: Ambulates independently  ADL's / Homemaking Assistance Needed: Brother prepares meals and performs heavy housekeeping         Hand Dominance        Extremity/Trunk Assessment   Upper Extremity Assessment: Overall WFL for tasks assessed           Lower Extremity Assessment: Generalized weakness         Communication   Communication: No difficulties  Cognition Arousal/Alertness: Awake/alert Behavior During Therapy: WFL for tasks assessed/performed;Anxious Overall Cognitive Status: Within Functional Limits for tasks assessed                      General Comments      Exercises  Assessment/Plan    PT Assessment Patient needs continued PT services  PT Diagnosis Difficulty walking;Abnormality of gait;Generalized weakness   PT Problem List Decreased strength;Decreased activity tolerance;Decreased balance;Decreased mobility;Decreased knowledge of use of DME;Cardiopulmonary status limiting activity  PT Treatment Interventions DME instruction;Gait training;Stair training;Functional  mobility training;Therapeutic exercise;Patient/family education   PT Goals (Current goals can be found in the Care Plan section) Acute Rehab PT Goals Patient Stated Goal: To get my strength back PT Goal Formulation: With patient/family Time For Goal Achievement: 12/12/13 Potential to Achieve Goals: Good    Frequency Min 3X/week   Barriers to discharge        Co-evaluation               End of Session Equipment Utilized During Treatment: Gait belt;Oxygen Activity Tolerance: Patient limited by fatigue Patient left: in bed;with call bell/phone within reach;with family/visitor present (sitting EOB) Nurse Communication: Mobility status         Time: 0100-7121 PT Time Calculation (min): 23 min   Charges:   PT Evaluation $Initial PT Evaluation Tier I: 1 Procedure PT Treatments $Gait Training: 8-22 mins   PT G Codes:          Despina Pole 12-20-13, 3:29 PM Carita Pian. Sanjuana Kava, [place] Pager [phone]

## 2013-12-05 NOTE — Progress Notes (Signed)
Patient HR down to 114 Sinus Tach. Patient resting in bed comfortably.  No distress stated or noted.

## 2013-12-05 NOTE — Progress Notes (Signed)
PATIENT DETAILS Name: Caroline Hill Age: 68 y.o. Sex: female Date of Birth: Apr 06, 1946 Admit Date: 12/01/2013 Admitting Physician Kinnie Feil, MD ERD:[id number],YJEHUDJ C, MD  Subjective: Awake and alert. Feels a lot better  Assessment/Plan: Principal Problem: Acute on Chronic Hypoxic Resp Failure -secondary to COPD exac,CT Angio Chest negative for Pul Embolism.  [facility] course complicated by development of worsening Hypoxia and Hypecarbia, transferred to SDU and briefly needed BiPAP on 5/21, briefly required High flow O2 as well-rapid improvement-and transferred back to floor on 5/22--continue to treat with tapering Solumedrol,nebs, Levaquin  Acute Metabolic Encephalopathy -from CO2 narcosis -resolved with BiPAP-awake and alert this am  COPD exacerbation -significantly improved, less wheezing and less hypoxic compared to the past few days -c/w Solumedrol-but taper, Nebs and empiric Levaquin  CAP -seen on CT Chest -on Levaquin-day 5 -afebrile, non toxic looking. Mild leukocytosis-likely from steroids  Chest Pain -occurred on 5/21-described it like "pressure"-retrosternal, relieved by sublingual nitroglycerin -EKG with no acute changes,  troponins cycled-negative -has known hx of CAD-c/w ASA, beta blocker on hold due to COPD exac  Sinus Tachycardia -suspect secondary to anxiety,albuterol nebs. CT Angio chest neg.Better but intermittently continues-will start low dose cardizem.  HTN (hypertension) - Blood pressure was soft over the past few days, therefore held oral meds-will restart low dose meds-will use cardizem given intermittent sinus tachycardia.  Anxiety - Continue Klonopin-dose adjusted by PCCM for hypercarbia  History of CAD -chest pain this am as above-await trops - Continue with aspirin, and statins. Beta blocker on hold  Tobacco abuse - Counseled extensively, placed on transdermal nicotine   Protein-calorie malnutrition, severe - Continue  supplements  Hx of Rh. 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  MEDICATIONS: Scheduled Meds: . antiseptic oral rinse  15 mL Mouth Rinse q12n4p  . aspirin EC  81 mg Oral Q breakfast  . atorvastatin  10 mg Oral q1800  . chlorhexidine  15 mL Mouth Rinse BID  . cholecalciferol  5,000 Units Oral Daily  . clonazePAM  0.5 mg Oral QHS  . enoxaparin (LOVENOX) injection  40 mg Subcutaneous Q24H  . feeding supplement (ENSURE COMPLETE)  237 mL Oral BID BM  . FLUoxetine  60 mg Oral Daily  . furosemide  20 mg Intravenous Once  . guaiFENesin  600 mg Oral BID  . ipratropium  0.5 mg Nebulization Q6H  . lamoTRIgine  100 mg Oral Q breakfast  . levalbuterol  0.63 mg Nebulization Q6H  . levofloxacin (LEVAQUIN) IV  500 mg Intravenous Q24H  . methylPREDNISolone (SOLU-MEDROL) injection  40 mg Intravenous 3 times per day  . nicotine  21 mg Transdermal Daily  . pantoprazole  40 mg Oral Q1200  . risperiDONE  1 mg Oral BID  . sodium chloride  3 mL Intravenous Q12H  . traZODone  50 mg Oral QHS  . vitamin B-12  5,000 mcg Oral Q breakfast   Continuous Infusions: . sodium chloride Stopped (12/04/13 0500)   PRN Meds:.acetaminophen, acetaminophen, benzonatate, clonazePAM, gi cocktail, HYDROcodone-acetaminophen, nitroGLYCERIN, ondansetron (ZOFRAN) IV,  ondansetron  Antibiotics: Anti-infectives   Start     Dose/Rate Route Frequency Ordered Stop   12/01/13 1615  levofloxacin (LEVAQUIN) IVPB 500 mg     500 mg 100 mL/hr  over 60 Minutes Intravenous Every 24 hours 12/01/13 1602         PHYSICAL EXAM: Vital signs in last 24 hours: Filed Vitals:   12/04/13 2150 12/05/13 0051 12/05/13 0509 12/05/13 0741  BP: 154/80  161/75   Pulse: 75  78   Temp: 97.9 F (36.6 C)  98.5 F (36.9 C)   TempSrc: Oral  Oral   Resp: 20  20   Height:      Weight:      SpO2: 91% 90% 92% 91%    Weight change: 1.3 kg (2 lb 13.9 oz) Filed Weights   12/01/13 1710 12/03/13 1254 12/04/13 1718  Weight: 48.988 kg (108 lb) 53.3 kg (117 lb 8.1 oz) 54.6 kg (120 lb 5.9 oz)   Body mass index is 18.85 kg/(m^2).   Gen Exam: Awake and alert with clear speech.  In mildly distress-tremulous Neck: Supple, No JVD.   Chest: Decreased air entry B/L-only some scattered rhonchi heared CVS: S1 S2 Regular, tachycardic Abdomen: soft, BS +, non tender, non distended.  Extremities: no edema, lower extremities warm to touch. Neurologic: Non Focal.   Skin: No Rash.   Wounds: N/A.    Intake/Output from previous day:  Intake/Output Summary (Last 24 hours) at 12/05/13 0855 Last data filed at 12/04/13 1108  Gross per 24 hour  Intake      0 ml  Output     75 ml  Net    -75 ml     LAB RESULTS: CBC  Recent Labs Lab 12/01/13 1323 12/03/13 0538 12/04/13 1015 12/05/13 0435  WBC 7.5 13.2* 8.7 10.3  HGB 15.7* 14.5 15.3* 13.8  HCT 49.1* 46.6* 47.9* 44.3  PLT 180 217 212 219  MCV 99.4 100.4* 98.2 96.5  MCH 31.8 31.3 31.4 30.1  MCHC 32.0 31.1 31.9 31.2  RDW 14.0 14.3 14.2 14.2  LYMPHSABS 1.1  --   --   --   MONOABS 0.8  --   --   --   EOSABS 0.0  --   --   --   BASOSABS 0.0  --   --   --     Chemistries   Recent Labs Lab 12/01/13 1323 12/02/13 0750 12/03/13 0538 12/04/13 1015 12/05/13 0435  NA 146 143 147 141 143  K 3.8 4.1 4.5 4.3 3.4*  CL 96 98 99 93* 95*  CO2 42* 38* 37* 40* 44*  GLUCOSE 101* 154* 142* 190* 94  BUN 23 20 17  29* 20  CREATININE 0.60 0.55 0.59 0.60 0.67  CALCIUM 9.6 9.2 9.2 9.4 9.1    CBG: No  results found for this basename: GLUCAP,  in the last 168 hours  GFR Estimated Creatinine Clearance: 58.8 ml/min (by C-G formula based on Cr of 0.67).  Coagulation profile No results found for this basename: INR, PROTIME,  in the last 168 hours  Cardiac Enzymes  Recent Labs Lab 12/03/13 1025 12/03/13 1426 12/03/13 2112  TROPONINI <0.30 <0.30 <0.30    No components found with this basename: POCBNP,  No results found for this basename: DDIMER,  in the last 72 hours No results found for this basename: HGBA1C,  in the last 72 hours No results found for this basename: CHOL, HDL, LDLCALC, TRIG, CHOLHDL, LDLDIRECT,  in the last 72 hours  No results found for this basename: TSH, T4TOTAL, FREET3, T3FREE, THYROIDAB,  in the last 72 hours No results found for this basename: VITAMINB12, FOLATE, FERRITIN, TIBC, IRON, RETICCTPCT,  in the last 72 hours No results found for this basename: LIPASE, AMYLASE,  in the last 72 hours  Urine Studies No results found for this basename: UACOL, UAPR, USPG, UPH, UTP, UGL, UKET, UBIL, UHGB, UNIT, UROB, ULEU, UEPI, UWBC, URBC, UBAC, CAST, CRYS, UCOM, BILUA,  in the last 72 hours  MICROBIOLOGY: Recent Results (from the past 240 hour(s))  MRSA PCR SCREENING     Status: None   Collection Time    12/04/13  5:34 AM      Result Value Ref Range Status   MRSA by PCR NEGATIVE  NEGATIVE Final   Comment:            The GeneXpert MRSA Assay (FDA     approved for NASAL specimens     only), is one component of a     comprehensive MRSA colonization     surveillance program. It is not     intended to diagnose MRSA     infection nor to guide or     monitor treatment for     MRSA infections.    RADIOLOGY STUDIES/RESULTS: Dg Chest 2 View  12/01/2013   CLINICAL DATA:  Shortness of breath, cough and congestion with history of COPD, coronary artery disease, and tobacco use  EXAM: CHEST  2 VIEW  COMPARISON:  CT scan of the chest dated August 17, 2011.  FINDINGS: The  lungs are mildly hyperinflated. The interstitial markings are coarse bilaterally. There is subtle nodular density that projects just lateral to the right main descending pulmonary artery on the frontal view which is not clearly demonstrated on the lateral film. This may correspond to the previously demonstrated nodule in the right middle lobe. The cardiopericardial silhouette is normal in size. The pulmonary vascularity is not engorged. There is no pleural effusion or pneumothorax. The observed portions of the bony thorax exhibit no acute abnormalities.  IMPRESSION: There is hyperinflation consistent with COPD. There is no evidence of pneumonia nor CHF. There is subtle nodular density in the right mid to lower lung on the frontal film which may correspond to previously demonstrated nodule on the CT scan dated August 17, 2011. Follow-up noncontrast CT scanning is recommended to evaluate this area of nodularity for stability or change.   Electronically Signed   By: David  Martinique   On: 12/01/2013 14:02   Ct Angio Chest Pe W/cm &/or Wo Cm  12/02/2013   ADDENDUM REPORT: 12/02/2013 11:45  ADDENDUM: Impression Number 5 as follows:  5. Triangular opacity in the right middle lobe along the minor fissure unchanged compared with 08/17/2011.   Electronically Signed   By: Kathreen Devoid   On: 12/02/2013 11:45   12/02/2013   CLINICAL DATA:  Upper chest pain, productive cough  EXAM: CT ANGIOGRAPHY CHEST WITH CONTRAST  TECHNIQUE: Multidetector CT imaging of the chest was performed using the standard protocol during bolus administration of intravenous contrast. Multiplanar CT image reconstructions and MIPs were obtained to evaluate the vascular anatomy.  CONTRAST:  9mL OMNIPAQUE IOHEXOL 350 MG/ML SOLN  COMPARISON:  None.  FINDINGS: There is adequate opacification of the pulmonary arteries. There is no pulmonary embolus. The main pulmonary artery, right main pulmonary artery and left main pulmonary arteries are normal in size.  The heart size is normal. There is no pericardial effusion. There is coronary  artery atherosclerosis involving the LAD and circumflex.  There is centrilobular emphysema. There is no focal consolidation. There is no pleural effusion or pneumothorax. There is bullous disease in the medial aspect of the anterior segment of the there is a 10 x 7 mm triangular opacity in the right middle lobe along the minor fissure. There is reticular nodular interstitial disease at the right lung base and left lung base which may be secondary to infectious or inflammatory etiology. There is bibasilar airspace disease, right greater than left.  There is no axillary, hilar, or mediastinal adenopathy.  There is mild relative esophageal wall thickening as can be seen with esophagitis.  There is no lytic or blastic osseous lesion.  The visualized portions of the upper abdomen are unremarkable.  Review of the MIP images confirms the above findings.  IMPRESSION: 1. No evidence of pulmonary embolus. 2. Bibasilar reticular nodular interstitial disease which may reflect an infectious or inflammatory etiology including atypical infection. There is bibasilar airspace disease which may reflect atelectasis versus developing pneumonia. 3. Coronary artery disease. 4. Mild relative esophageal wall thickening as can be seen with esophagitis. If there is further clinical concern recommend a barium swallow or upper endoscopy.  Electronically Signed: By: Kathreen Devoid On: 12/02/2013 11:33    Vaneza Pickart Kristeen Mans, MD  [facility] Pager:[phone]  If 7PM-7AM, please contact night-coverage [email or web address] Password [email or web address] 12/05/2013, 8:55 AM   LOS: 4 days   **Disclaimer: This note may have been dictated with voice recognition software. Similar sounding words can inadvertently be transcribed and this note may contain transcription errors which may not have been corrected upon publication of note.**

## 2013-12-05 NOTE — Progress Notes (Signed)
CRITICAL VALUE ALERT  Critical value received:  CO2 44  Date of notification:  12/05/13  Time of notification:  06:24  Critical value read back: yes  Nurse who received alert:  Parthenia Ames  MD notified (1st page):  Rogue Bussing  Time of first page:  06:25  MD notified (2nd page):  Time of second page:  Responding MD: Rogue Bussing  Time MD responded:  06:32  No new orders

## 2013-12-05 NOTE — Progress Notes (Signed)
HR increase again  To 150's MD noted to evaluate and ordered 12 lead EKG

## 2013-12-06 DIAGNOSIS — M129 Arthropathy, unspecified: Secondary | ICD-10-CM

## 2013-12-06 DIAGNOSIS — I779 Disorder of arteries and arterioles, unspecified: Secondary | ICD-10-CM

## 2013-12-06 LAB — BASIC METABOLIC PANEL
BUN: 18 mg/dL (ref 6–23)
CALCIUM: 9.3 mg/dL (ref 8.4–10.5)
CO2: 40 meq/L — AB (ref 19–32)
Chloride: 94 mEq/L — ABNORMAL LOW (ref 96–112)
Creatinine, Ser: 0.57 mg/dL (ref 0.50–1.10)
GFR calc Af Amer: >90 mL/min (ref >90)
GFR calc non Af Amer: >90 mL/min (ref >90)
GLUCOSE: 151 mg/dL — AB (ref 70–99)
Potassium: 4.2 mEq/L (ref 3.7–5.3)
Sodium: 139 mEq/L (ref 137–147)

## 2013-12-06 MED ORDER — GUAIFENESIN ER 600 MG PO TB12
1200.0000 mg | ORAL_TABLET | Freq: Two times a day (BID) | ORAL | Status: DC
  Administered 2013-12-06 – 2013-12-08 (×4): 1200 mg via ORAL
  Filled 2013-12-06 (×5): qty 2

## 2013-12-06 MED ORDER — FUROSEMIDE 10 MG/ML IJ SOLN
40.0000 mg | Freq: Once | INTRAMUSCULAR | Status: AC
  Administered 2013-12-06: 40 mg via INTRAVENOUS
  Filled 2013-12-06 (×2): qty 4

## 2013-12-06 NOTE — Progress Notes (Signed)
PATIENT DETAILS Name: Caroline Hill Age: 68 y.o. Sex: female Date of Birth: [birth year] Admit Date: 12/01/2013 Admitting Physician Kinnie Feil, MD CXK:GYJEHU,DJSHFWY C, MD  Subjective: Still have some shortness of breath, cough and sputum production. Patient requires 5 L to keep her oxygen saturation about 92%.  Assessment/Plan: Principal Problem: Acute on Chronic Hypoxic Resp Failure -secondary to COPD exac,CT Angio Chest negative for Pul Embolism.  [facility] course complicated by development of worsening Hypoxia and Hypecarbia, transferred to SDU and briefly needed BiPAP on 5/21, briefly required High flow O2 as well-rapid improvement-and transferred back to floor on 5/22. -Continue to treat with tapering Solumedrol,nebs, Levaquin  Acute Metabolic Encephalopathy -From CO2 narcosis -resolved with BiPAP-awake and alert this am  COPD exacerbation -Significantly improved, less wheezing and less hypoxic compared to the past few days -c/w Solumedrol-but taper, Nebs and empiric Levaquin  CAP -seen on CT Chest -on Levaquin-day 5 -afebrile, non toxic looking. Mild leukocytosis-likely from steroids  Chest Pain -occurred on 5/21-described it like "pressure"-retrosternal, relieved by sublingual nitroglycerin -EKG with no acute changes,  troponins cycled-negative -has known hx of CAD-c/w ASA, beta blocker on hold due to COPD exac  Sinus Tachycardia -suspect secondary to anxiety,albuterol nebs. CT Angio chest neg.Better but intermittently continues-will start low dose cardizem.  HTN (hypertension) - Blood pressure was soft over the past few days, therefore held oral meds-will restart low dose meds-will use cardizem given intermittent sinus tachycardia.  Anxiety - Continue Klonopin-dose adjusted by PCCM for hypercarbia  History of CAD -chest pain this am as above-await trops - Continue with aspirin, and statins. Beta blocker on hold  Tobacco abuse - Counseled  extensively, placed on transdermal nicotine   Protein-calorie malnutrition, severe - Continue supplements  Hx of Rh. 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  DVT Prophylaxis: Prophylactic Lovenox   Code Status: DNR  Family Communication   Procedures:  None  CONSULTS:  None  Time spent 40 minutes-which includes 50% of the time with face-to-face with patient/ family and coordinating care related to the above assessment and plan.  MEDICATIONS: Scheduled Meds: . aspirin EC  81 mg Oral Q breakfast  . atorvastatin  10 mg Oral q1800  . cholecalciferol  5,000 Units Oral Daily  . clonazePAM  0.5 mg Oral QHS  . diltiazem  30 mg Oral TID  . enoxaparin (LOVENOX) injection  40 mg Subcutaneous Q24H  . feeding supplement (ENSURE COMPLETE)  237 mL Oral BID BM  . FLUoxetine  60 mg Oral Daily  . guaiFENesin  600 mg Oral BID  . ipratropium  0.5 mg Nebulization Q6H  . lamoTRIgine  100 mg Oral Q breakfast  . levalbuterol  0.63 mg Nebulization Q6H  . levofloxacin (LEVAQUIN) IV  500 mg Intravenous Q24H  . methylPREDNISolone (SOLU-MEDROL) injection  40 mg Intravenous 3 times per day  . nicotine  21 mg Transdermal Daily  . pantoprazole  40 mg Oral Q1200  . risperiDONE  1 mg Oral BID  . sodium chloride  3 mL Intravenous Q12H  . traZODone  50 mg Oral QHS  . vitamin B-12  5,000 mcg Oral Q breakfast   Continuous Infusions: . sodium chloride Stopped (12/04/13 0500)   PRN Meds:.acetaminophen, acetaminophen, benzonatate, clonazePAM, gi cocktail, HYDROcodone-acetaminophen, nitroGLYCERIN, ondansetron (ZOFRAN) IV, ondansetron  Antibiotics: Anti-infectives   Start  Dose/Rate Route Frequency Ordered Stop   12/01/13 1615  levofloxacin (LEVAQUIN) IVPB 500 mg     500 mg 100 mL/hr over 60 Minutes Intravenous Every 24 hours  12/01/13 1602         PHYSICAL EXAM: Vital signs in last 24 hours: Filed Vitals:   12/05/13 1329 12/05/13 2145 12/06/13 0608 12/06/13 0714  BP: 119/68 133/73 135/67   Pulse: 104 99 91   Temp: 98.4 F (36.9 C) 98.5 F (36.9 C) 97.9 F (36.6 C)   TempSrc: Oral Oral Oral   Resp: 16 16 16    Height:      Weight:      SpO2: 90% 91% 91% 92%    Weight change:  Filed Weights   12/01/13 1710 12/03/13 1254 12/04/13 1718  Weight: 48.988 kg (108 lb) 53.3 kg (117 lb 8.1 oz) 54.6 kg (120 lb 5.9 oz)   Body mass index is 18.85 kg/(m^2).   Gen Exam: Awake and alert with clear speech.  In mildly distress-tremulous Neck: Supple, No JVD.   Chest: Decreased air entry B/L-only some scattered rhonchi heared CVS: S1 S2 Regular, tachycardic Abdomen: soft, BS +, non tender, non distended.  Extremities: no edema, lower extremities warm to touch. Neurologic: Non Focal.   Skin: No Rash.   Wounds: N/A.    Intake/Output from previous day: No intake or output data in the 24 hours ending 12/06/13 1033   LAB RESULTS: CBC  Recent Labs Lab 12/01/13 1323 12/03/13 0538 12/04/13 1015 12/05/13 0435  WBC 7.5 13.2* 8.7 10.3  HGB 15.7* 14.5 15.3* 13.8  HCT 49.1* 46.6* 47.9* 44.3  PLT 180 217 212 219  MCV 99.4 100.4* 98.2 96.5  MCH 31.8 31.3 31.4 30.1  MCHC 32.0 31.1 31.9 31.2  RDW 14.0 14.3 14.2 14.2  LYMPHSABS 1.1  --   --   --   MONOABS 0.8  --   --   --   EOSABS 0.0  --   --   --   BASOSABS 0.0  --   --   --     Chemistries   Recent Labs Lab 12/02/13 0750 12/03/13 0538 12/04/13 1015 12/05/13 0435 12/06/13 0640  NA 143 147 141 143 139  K 4.1 4.5 4.3 3.4* 4.2  CL 98 99 93* 95* 94*  CO2 38* 37* 40* 44* 40*  GLUCOSE 154* 142* 190* 94 151*  BUN 20 17 29* 20 18  CREATININE 0.55 0.59 0.60 0.67 0.57  CALCIUM 9.2 9.2 9.4 9.1 9.3    CBG: No results found for this basename: GLUCAP,  in the last 168 hours  GFR Estimated Creatinine Clearance: 58.8 ml/min (by C-G formula based on Cr  of 0.57).  Coagulation profile No results found for this basename: INR, PROTIME,  in the last 168 hours  Cardiac Enzymes  Recent Labs Lab 12/03/13 1025 12/03/13 1426 12/03/13 2112  TROPONINI <0.30 <0.30 <0.30    No components found with this basename: POCBNP,  No results found for this basename: DDIMER,  in the last 72 hours No results found for this basename: HGBA1C,  in the last 72 hours No results found for this basename: CHOL, HDL, LDLCALC, TRIG, CHOLHDL, LDLDIRECT,  in the last 72 hours No results found for this basename: TSH, T4TOTAL, FREET3, T3FREE, THYROIDAB,  in the last 72 hours No results found for this basename: VITAMINB12, FOLATE, FERRITIN, TIBC, IRON, RETICCTPCT,  in the last 72 hours No results found for this basename: LIPASE, AMYLASE,  in the last  72 hours  Urine Studies No results found for this basename: UACOL, UAPR, USPG, UPH, UTP, UGL, UKET, UBIL, UHGB, UNIT, UROB, ULEU, UEPI, UWBC, URBC, UBAC, CAST, CRYS, UCOM, BILUA,  in the last 72 hours  MICROBIOLOGY: Recent Results (from the past 240 hour(s))  MRSA PCR SCREENING     Status: None   Collection Time    12/04/13  5:34 AM      Result Value Ref Range Status   MRSA by PCR NEGATIVE  NEGATIVE Final   Comment:            The GeneXpert MRSA Assay (FDA     approved for NASAL specimens     only), is one component of a     comprehensive MRSA colonization     surveillance program. It is not     intended to diagnose MRSA     infection nor to guide or     monitor treatment for     MRSA infections.    RADIOLOGY STUDIES/RESULTS: Dg Chest 2 View  12/01/2013   CLINICAL DATA:  Shortness of breath, cough and congestion with history of COPD, coronary artery disease, and tobacco use  EXAM: CHEST  2 VIEW  COMPARISON:  CT scan of the chest dated August 17, 2011.  FINDINGS: The lungs are mildly hyperinflated. The interstitial markings are coarse bilaterally. There is subtle nodular density that projects just lateral to  the right main descending pulmonary artery on the frontal view which is not clearly demonstrated on the lateral film. This may correspond to the previously demonstrated nodule in the right middle lobe. The cardiopericardial silhouette is normal in size. The pulmonary vascularity is not engorged. There is no pleural effusion or pneumothorax. The observed portions of the bony thorax exhibit no acute abnormalities.  IMPRESSION: There is hyperinflation consistent with COPD. There is no evidence of pneumonia nor CHF. There is subtle nodular density in the right mid to lower lung on the frontal film which may correspond to previously demonstrated nodule on the CT scan dated August 17, 2011. Follow-up noncontrast CT scanning is recommended to evaluate this area of nodularity for stability or change.   Electronically Signed   By: David  Martinique   On: 12/01/2013 14:02   Ct Angio Chest Pe W/cm &/or Wo Cm  12/02/2013   ADDENDUM REPORT: 12/02/2013 11:45  ADDENDUM: Impression Number 5 as follows:  5. Triangular opacity in the right middle lobe along the minor fissure unchanged compared with 08/17/2011.   Electronically Signed   By: Kathreen Devoid   On: 12/02/2013 11:45   12/02/2013   CLINICAL DATA:  Upper chest pain, productive cough  EXAM: CT ANGIOGRAPHY CHEST WITH CONTRAST  TECHNIQUE: Multidetector CT imaging of the chest was performed using the standard protocol during bolus administration of intravenous contrast. Multiplanar CT image reconstructions and MIPs were obtained to evaluate the vascular anatomy.  CONTRAST:  52mL OMNIPAQUE IOHEXOL 350 MG/ML SOLN  COMPARISON:  None.  FINDINGS: There is adequate opacification of the pulmonary arteries. There is no pulmonary embolus. The main pulmonary artery, right main pulmonary artery and left main pulmonary arteries are normal in size. The heart size is normal. There is no pericardial effusion. There is coronary artery atherosclerosis involving the LAD and circumflex.  There is  centrilobular emphysema. There is no focal consolidation. There is no pleural effusion or pneumothorax. There is bullous disease in the medial aspect of the anterior segment of the there is a 10 x 7 mm triangular opacity  in the right middle lobe along the minor fissure. There is reticular nodular interstitial disease at the right lung base and left lung base which may be secondary to infectious or inflammatory etiology. There is bibasilar airspace disease, right greater than left.  There is no axillary, hilar, or mediastinal adenopathy.  There is mild relative esophageal wall thickening as can be seen with esophagitis.  There is no lytic or blastic osseous lesion.  The visualized portions of the upper abdomen are unremarkable.  Review of the MIP images confirms the above findings.  IMPRESSION: 1. No evidence of pulmonary embolus. 2. Bibasilar reticular nodular interstitial disease which may reflect an infectious or inflammatory etiology including atypical infection. There is bibasilar airspace disease which may reflect atelectasis versus developing pneumonia. 3. Coronary artery disease. 4. Mild relative esophageal wall thickening as can be seen with esophagitis. If there is further clinical concern recommend a barium swallow or upper endoscopy.  Electronically Signed: By: Kathreen Devoid On: 12/02/2013 11:33    Verlee Monte, MD  [facility] Pager:[phone]  If 7PM-7AM, please contact night-coverage [email or web address] Password [email or web address] 12/06/2013, 10:33 AM   LOS: 5 days   **Disclaimer: This note may have been dictated with voice recognition software. Similar sounding words can inadvertently be transcribed and this note may contain transcription errors which may not have been corrected upon publication of note.**

## 2013-12-06 NOTE — Progress Notes (Signed)
Physical Therapy Treatment Patient Details Name: Caroline Hill MRN: [id number] DOB: 03-Apr-1946 Today's Date: 12/06/2013    History of Present Illness Patient is a 68 yo female admitted 12/01/13 with dyspnea, COPD exacerbation, hypoxia (BiPAP x 1 day), metabolic encephalopathy, tachycardia.  Patient with h/o HTN, COPD, anxiety, CAD, bipolar disorder.    PT Comments    Patient with improved gait and balance today.  Did note decrease in O2 sat with ambulation (see numbers below).  RN notified and to room.  Follow Up Recommendations  Home health PT;Supervision/Assistance - 24 hour     Equipment Recommendations  Rolling walker with 5" wheels    Recommendations for Other Services       Precautions / Restrictions Precautions Precautions: Fall Precaution Comments: On O2 at home at night - 3 l/min Restrictions Weight Bearing Restrictions: No    Mobility  Bed Mobility Overal bed mobility: Modified Independent             General bed mobility comments: Patient able to move to EOB with use of bed rail only.  Transfers Overall transfer level: Modified independent Equipment used: None Transfers: Sit to/from Stand Sit to Stand: Modified independent (Device/Increase time)         General transfer comment: Able to move sit <> stand from bed with good balance.  Ambulation/Gait Ambulation/Gait assistance: Supervision Ambulation Distance (Feet): 220 Feet Assistive device: None Gait Pattern/deviations: Step-through pattern;Decreased stride length;Staggering right Gait velocity: Decreased Gait velocity interpretation: Below normal speed for age/gender General Gait Details: Patient ambulated today without assistive device.  Able to ambulate 220' with one standing rest break.  Staggered once to right - able to self correct.  During gait, patient on O2 at 3 l/min and O2 sats dropped to 86%.  Increased O2 to 4 l/min, and O2 sat increased to 88%.  Once sitting on bed, O2 sats returned  to 92% on 4 l/min.  RN notified.   Stairs            Wheelchair Mobility    Modified Rankin (Stroke Patients Only)       Balance           Standing balance support: No upper extremity supported Standing balance-Leahy Scale: Good                      Cognition Arousal/Alertness: Awake/alert Behavior During Therapy: WFL for tasks assessed/performed;Anxious Overall Cognitive Status: Within Functional Limits for tasks assessed                      Exercises      General Comments        Pertinent Vitals/Pain Gait on 3 l/min O2 - 86% Gait on 4 l/min O2 - 88% Following rest on 4 l/min - 92% RN to room to address.    Home Living                      Prior Function            PT Goals (current goals can now be found in the care plan section) Progress towards PT goals: Progressing toward goals    Frequency  Min 3X/week    PT Plan Current plan remains appropriate    Co-evaluation             End of Session Equipment Utilized During Treatment: Gait belt;Oxygen Activity Tolerance: Patient tolerated treatment well;Patient limited by fatigue Patient left: in bed;with  call bell/phone within reach;with nursing/sitter in room     Time: 1955-2013 PT Time Calculation (min): 18 min  Charges:  $Gait Training: 8-22 mins                    G Codes:      Despina Pole 19-Dec-2013, 8:30 PM Carita Pian. Sanjuana Kava, [place] Pager [phone]

## 2013-12-07 LAB — BASIC METABOLIC PANEL
BUN: 21 mg/dL (ref 6–23)
CALCIUM: 9.4 mg/dL (ref 8.4–10.5)
CO2: 41 mEq/L (ref 19–32)
Chloride: 93 mEq/L — ABNORMAL LOW (ref 96–112)
Creatinine, Ser: 0.63 mg/dL (ref 0.50–1.10)
GFR calc Af Amer: >90 mL/min (ref >90)
GFR calc non Af Amer: >90 mL/min (ref >90)
GLUCOSE: 151 mg/dL — AB (ref 70–99)
Potassium: 4.7 mEq/L (ref 3.7–5.3)
Sodium: 139 mEq/L (ref 137–147)

## 2013-12-07 MED ORDER — FUROSEMIDE 20 MG PO TABS
20.0000 mg | ORAL_TABLET | Freq: Every day | ORAL | Status: DC
Start: 2013-12-07 — End: 2013-12-08
  Administered 2013-12-07 – 2013-12-08 (×2): 20 mg via ORAL
  Filled 2013-12-07 (×2): qty 1

## 2013-12-07 MED ORDER — METHYLPREDNISOLONE SODIUM SUCC 40 MG IJ SOLR
40.0000 mg | Freq: Two times a day (BID) | INTRAMUSCULAR | Status: DC
  Administered 2013-12-07 – 2013-12-08 (×2): 40 mg via INTRAVENOUS
  Filled 2013-12-07 (×4): qty 1

## 2013-12-07 NOTE — Progress Notes (Signed)
PATIENT DETAILS Name: Caroline Hill Age: 68 y.o. Sex: female Date of Birth: 07-16-1946 Admit Date: 12/01/2013 Admitting Physician Kinnie Feil, MD TDV:VOHYWV,PXTGGYI C, MD  Subjective: Awake and alert. Feels a lot better-close to baseline  Assessment/Plan: Principal Problem: Acute on Chronic Hypoxic Resp Failure -secondary to COPD exac,CT Angio Chest negative for Pul Embolism.  [facility] course complicated by development of worsening Hypoxia and Hypecarbia, transferred to SDU and briefly needed BiPAP on 5/21, briefly required High flow O2 as well-rapid improvement-and transferred back to floor on 5/22--continue to treat with tapering Solumedrol,nebs and Levaquin-stop 9/48  Acute Metabolic Encephalopathy -from CO2 narcosis -resolved with BiPAP-awake and alert this am  COPD exacerbation -significantly improved, less wheezing and less hypoxic compared to the past few days -c/w Solumedrol-but taper, Nebs and empiric Levaquin  CAP -seen on CT Chest -on Levaquin-day 7-therefore will stop -afebrile, non toxic looking. Mild leukocytosis-likely from steroids  Chest Pain -occurred on 5/21-described it like "pressure"-retrosternal, relieved by sublingual nitroglycerin -EKG with no acute changes,  troponins cycled-negative. Now chest pain free. -has known hx of CAD-c/w ASA, beta blocker on hold due to COPD exac  Sinus Tachycardia -suspect secondary to anxiety,albuterol nebs. CT Angio chest neg.Better but intermittently continues-tolerating low dose cardizem.  HTN (hypertension) - controlled with Cardizem.  Anxiety - Continue Klonopin-dose adjusted by PCCM for hypercarbia  History of CAD -chest pain this am as above-await trops - Continue with aspirin, and statins. Beta blocker on hold  Tobacco abuse - Counseled extensively, placed on transdermal nicotine   Protein-calorie malnutrition, severe - Continue supplements  Hx of Rh. 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 aspirin EC  81 mg Oral Q breakfast  . atorvastatin  10 mg Oral q1800  . cholecalciferol  5,000 Units Oral Daily  . clonazePAM  0.5 mg Oral QHS  . diltiazem  30 mg Oral TID  . enoxaparin (LOVENOX) injection  40 mg Subcutaneous Q24H  . feeding supplement (ENSURE COMPLETE)  237 mL Oral BID BM  . FLUoxetine  60 mg Oral Daily  . guaiFENesin  1,200 mg Oral BID  . ipratropium  0.5 mg Nebulization Q6H  . lamoTRIgine  100 mg Oral Q breakfast  . levalbuterol  0.63 mg Nebulization Q6H  . levofloxacin (LEVAQUIN) IV  500 mg Intravenous Q24H  . methylPREDNISolone (SOLU-MEDROL) injection  40 mg Intravenous 3 times per day  . nicotine  21 mg Transdermal Daily  . pantoprazole  40 mg Oral Q1200  . risperiDONE  1 mg Oral BID  . sodium chloride  3 mL Intravenous Q12H  . traZODone  50 mg Oral QHS  . vitamin B-12  5,000 mcg Oral Q breakfast   Continuous Infusions:   PRN Meds:.acetaminophen, acetaminophen, benzonatate, clonazePAM, gi cocktail, HYDROcodone-acetaminophen, nitroGLYCERIN, ondansetron (ZOFRAN) IV, ondansetron  Antibiotics: Anti-infectives   Start     Dose/Rate Route Frequency Ordered Stop   12/01/13 1615  levofloxacin (LEVAQUIN) IVPB 500 mg     500 mg 100 mL/hr over 60 Minutes Intravenous Every 24 hours 12/01/13 1602         PHYSICAL EXAM: Vital signs in last 24 hours:  Filed Vitals:   12/06/13 2010 12/06/13 2201 12/07/13 0436 12/07/13 0725  BP:  146/78 130/69   Pulse:  92 76   Temp:  98.3 F (36.8 C) 98.3 F (36.8 C)   TempSrc:  Oral Oral   Resp:  18 16   Height:      Weight:      SpO2: 92% 94% 92%  93%    Weight change:  Filed Weights   12/01/13 1710 12/03/13 1254 12/04/13 1718  Weight: 48.988 kg (108 lb) 53.3 kg (117 lb 8.1 oz) 54.6 kg (120 lb 5.9 oz)   Body mass index is 18.85 kg/(m^2).   Gen Exam: Awake and alert with clear speech.  In mildly distress-tremulous Neck: Supple, No JVD.   Chest: Decreased air entry B/L-only some scattered rhonchi heared CVS: S1 S2 Regular, tachycardic Abdomen: soft, BS +, non tender, non distended.  Extremities: no edema, lower extremities warm to touch. Neurologic: Non Focal.   Skin: No Rash.   Wounds: N/A.    Intake/Output from previous day: No intake or output data in the 24 hours ending 12/07/13 1123   LAB RESULTS: CBC  Recent Labs Lab 12/01/13 1323 12/03/13 0538 12/04/13 1015 12/05/13 0435  WBC 7.5 13.2* 8.7 10.3  HGB 15.7* 14.5 15.3* 13.8  HCT 49.1* 46.6* 47.9* 44.3  PLT 180 217 212 219  MCV 99.4 100.4* 98.2 96.5  MCH 31.8 31.3 31.4 30.1  MCHC 32.0 31.1 31.9 31.2  RDW 14.0 14.3 14.2 14.2  LYMPHSABS 1.1  --   --   --   MONOABS 0.8  --   --   --   EOSABS 0.0  --   --   --   BASOSABS 0.0  --   --   --     Chemistries   Recent Labs Lab 12/03/13 0538 12/04/13 1015 12/05/13 0435 12/06/13 0640 12/07/13 0452  NA 147 141 143 139 139  K 4.5 4.3 3.4* 4.2 4.7  CL 99 93* 95* 94* 93*  CO2 37* 40* 44* 40* 41*  GLUCOSE 142* 190* 94 151* 151*  BUN 17 29* 20 18 21   CREATININE 0.59 0.60 0.67 0.57 0.63  CALCIUM 9.2 9.4 9.1 9.3 9.4    CBG: No results found for this basename: GLUCAP,  in the last 168 hours  GFR Estimated Creatinine Clearance: 58.8 ml/min (by C-G formula based on Cr of 0.63).  Coagulation profile No results found for this basename: INR, PROTIME,  in the last 168 hours  Cardiac Enzymes  Recent Labs Lab 12/03/13 1025 12/03/13 1426 12/03/13 2112  TROPONINI <0.30 <0.30 <0.30    No components found with this basename: POCBNP,  No results found for this basename: DDIMER,  in the last 72 hours No  results found for this basename: HGBA1C,  in the last 72 hours No results found for this basename: CHOL, HDL, LDLCALC, TRIG, CHOLHDL, LDLDIRECT,  in the last 72 hours No results found for this basename: TSH, T4TOTAL, FREET3, T3FREE, THYROIDAB,  in the last 72 hours No results found for this basename: VITAMINB12, FOLATE, FERRITIN, TIBC, IRON, RETICCTPCT,  in the last 72 hours No results found for this basename: LIPASE, AMYLASE,  in the last 72 hours  Urine Studies No results found for this basename: UACOL, UAPR, USPG, UPH, UTP, UGL, UKET, UBIL, UHGB, UNIT, UROB, ULEU, UEPI, UWBC, URBC, UBAC, CAST, CRYS, UCOM, BILUA,  in the last 72 hours  MICROBIOLOGY: Recent Results (from the past 240 hour(s))  MRSA PCR SCREENING  Status: None   Collection Time    12/04/13  5:34 AM      Result Value Ref Range Status   MRSA by PCR NEGATIVE  NEGATIVE Final   Comment:            The GeneXpert MRSA Assay (FDA     approved for NASAL specimens     only), is one component of a     comprehensive MRSA colonization     surveillance program. It is not     intended to diagnose MRSA     infection nor to guide or     monitor treatment for     MRSA infections.    RADIOLOGY STUDIES/RESULTS: Dg Chest 2 View  12/01/2013   CLINICAL DATA:  Shortness of breath, cough and congestion with history of COPD, coronary artery disease, and tobacco use  EXAM: CHEST  2 VIEW  COMPARISON:  CT scan of the chest dated August 17, 2011.  FINDINGS: The lungs are mildly hyperinflated. The interstitial markings are coarse bilaterally. There is subtle nodular density that projects just lateral to the right main descending pulmonary artery on the frontal view which is not clearly demonstrated on the lateral film. This may correspond to the previously demonstrated nodule in the right middle lobe. The cardiopericardial silhouette is normal in size. The pulmonary vascularity is not engorged. There is no pleural effusion or pneumothorax. The  observed portions of the bony thorax exhibit no acute abnormalities.  IMPRESSION: There is hyperinflation consistent with COPD. There is no evidence of pneumonia nor CHF. There is subtle nodular density in the right mid to lower lung on the frontal film which may correspond to previously demonstrated nodule on the CT scan dated August 17, 2011. Follow-up noncontrast CT scanning is recommended to evaluate this area of nodularity for stability or change.   Electronically Signed   By: David  Martinique   On: 12/01/2013 14:02   Ct Angio Chest Pe W/cm &/or Wo Cm  12/02/2013   ADDENDUM REPORT: 12/02/2013 11:45  ADDENDUM: Impression Number 5 as follows:  5. Triangular opacity in the right middle lobe along the minor fissure unchanged compared with 08/17/2011.   Electronically Signed   By: Kathreen Devoid   On: 12/02/2013 11:45   12/02/2013   CLINICAL DATA:  Upper chest pain, productive cough  EXAM: CT ANGIOGRAPHY CHEST WITH CONTRAST  TECHNIQUE: Multidetector CT imaging of the chest was performed using the standard protocol during bolus administration of intravenous contrast. Multiplanar CT image reconstructions and MIPs were obtained to evaluate the vascular anatomy.  CONTRAST:  62mL OMNIPAQUE IOHEXOL 350 MG/ML SOLN  COMPARISON:  None.  FINDINGS: There is adequate opacification of the pulmonary arteries. There is no pulmonary embolus. The main pulmonary artery, right main pulmonary artery and left main pulmonary arteries are normal in size. The heart size is normal. There is no pericardial effusion. There is coronary artery atherosclerosis involving the LAD and circumflex.  There is centrilobular emphysema. There is no focal consolidation. There is no pleural effusion or pneumothorax. There is bullous disease in the medial aspect of the anterior segment of the there is a 10 x 7 mm triangular opacity in the right middle lobe along the minor fissure. There is reticular nodular interstitial disease at the right lung base and  left lung base which may be secondary to infectious or inflammatory etiology. There is bibasilar airspace disease, right greater than left.  There is no axillary, hilar, or mediastinal adenopathy.  There is  mild relative esophageal wall thickening as can be seen with esophagitis.  There is no lytic or blastic osseous lesion.  The visualized portions of the upper abdomen are unremarkable.  Review of the MIP images confirms the above findings.  IMPRESSION: 1. No evidence of pulmonary embolus. 2. Bibasilar reticular nodular interstitial disease which may reflect an infectious or inflammatory etiology including atypical infection. There is bibasilar airspace disease which may reflect atelectasis versus developing pneumonia. 3. Coronary artery disease. 4. Mild relative esophageal wall thickening as can be seen with esophagitis. If there is further clinical concern recommend a barium swallow or upper endoscopy.  Electronically Signed: By: Kathreen Devoid On: 12/02/2013 11:33    Aerik Polan Kristeen Mans, MD  [facility] Pager:[phone]  If 7PM-7AM, please contact night-coverage [email or web address] Password [email or web address] 12/07/2013, 11:23 AM   LOS: 6 days   **Disclaimer: This note may have been dictated with voice recognition software. Similar sounding words can inadvertently be transcribed and this note may contain transcription errors which may not have been corrected upon publication of note.**

## 2013-12-07 NOTE — Progress Notes (Signed)
CRITICAL VALUE ALERT  Critical value received:  Co2 41  Date of notification:  05/25  Time of notification:  0610  Critical value read back:yes  Nurse who received alert:  Steffanie Dunn   MD notified (1st page):  MD Ghimire  Time of first page:  Will be passed on to day nurse  MD notified (2nd page):  Time of second page:  Responding MD:    Time MD responded:

## 2013-12-08 ENCOUNTER — Telehealth: Payer: Self-pay

## 2013-12-08 ENCOUNTER — Encounter (HOSPITAL_COMMUNITY): Payer: Medicare Other

## 2013-12-08 DIAGNOSIS — J449 Chronic obstructive pulmonary disease, unspecified: Secondary | ICD-10-CM

## 2013-12-08 MED ORDER — HYDROCODONE-ACETAMINOPHEN 5-325 MG PO TABS: 1.0000 | ORAL_TABLET | Freq: Three times a day (TID) | ORAL | Status: DC | PRN

## 2013-12-08 MED ORDER — PANTOPRAZOLE SODIUM 40 MG PO TBEC: 40.0000 mg | DELAYED_RELEASE_TABLET | Freq: Every day | ORAL | Status: DC

## 2013-12-08 MED ORDER — GUAIFENESIN ER 600 MG PO TB12: 1200.0000 mg | ORAL_TABLET | Freq: Two times a day (BID) | ORAL | Status: DC

## 2013-12-08 MED ORDER — CLONAZEPAM 0.5 MG PO TABS: 0.5000 mg | ORAL_TABLET | Freq: Every day | ORAL | Status: DC

## 2013-12-08 MED ORDER — LEVALBUTEROL HCL 0.63 MG/3ML IN NEBU: 0.6300 mg | INHALATION_SOLUTION | Freq: Three times a day (TID) | RESPIRATORY_TRACT | Status: DC

## 2013-12-08 MED ORDER — RISPERIDONE 1 MG PO TABS: 2.0000 mg | ORAL_TABLET | Freq: Two times a day (BID) | ORAL | Status: DC

## 2013-12-08 MED ORDER — FUROSEMIDE 20 MG PO TABS: 20.0000 mg | ORAL_TABLET | Freq: Every day | ORAL | Status: DC

## 2013-12-08 MED ORDER — TRAZODONE HCL 50 MG PO TABS: 100.0000 mg | ORAL_TABLET | Freq: Every day | ORAL | Status: DC

## 2013-12-08 MED ORDER — DILTIAZEM HCL 30 MG PO TABS: 30.0000 mg | ORAL_TABLET | Freq: Three times a day (TID) | ORAL | Status: DC

## 2013-12-08 MED ORDER — IPRATROPIUM BROMIDE 0.02 % IN SOLN
0.5000 mg | Freq: Three times a day (TID) | RESPIRATORY_TRACT | Status: DC
  Administered 2013-12-08: 0.5 mg via RESPIRATORY_TRACT
  Filled 2013-12-08: qty 2.5

## 2013-12-08 MED ORDER — ENSURE COMPLETE PO LIQD: 237.0000 mL | Freq: Two times a day (BID) | ORAL | Status: DC

## 2013-12-08 MED ORDER — BENZONATATE 200 MG PO CAPS: 200.0000 mg | ORAL_CAPSULE | Freq: Three times a day (TID) | ORAL | Status: DC | PRN

## 2013-12-08 MED ORDER — PREDNISONE 5 MG PO TABS: ORAL_TABLET | ORAL | Status: DC

## 2013-12-08 MED ORDER — LEVALBUTEROL HCL 0.63 MG/3ML IN NEBU: 0.6300 mg | INHALATION_SOLUTION | Freq: Four times a day (QID) | RESPIRATORY_TRACT | Status: DC | PRN

## 2013-12-08 MED ORDER — ALBUTEROL SULFATE HFA 108 (90 BASE) MCG/ACT IN AERS: 2.0000 | INHALATION_SPRAY | RESPIRATORY_TRACT | Status: DC | PRN

## 2013-12-08 MED ORDER — LEVALBUTEROL HCL 0.63 MG/3ML IN NEBU
0.6300 mg | INHALATION_SOLUTION | Freq: Three times a day (TID) | RESPIRATORY_TRACT | Status: DC
  Administered 2013-12-08: 0.63 mg via RESPIRATORY_TRACT
  Filled 2013-12-08 (×4): qty 3

## 2013-12-08 MED ORDER — IPRATROPIUM BROMIDE 0.02 % IN SOLN: 0.5000 mg | Freq: Three times a day (TID) | RESPIRATORY_TRACT | Status: DC

## 2013-12-08 MED ORDER — NICOTINE 21 MG/24HR TD PT24: 21.0000 mg | MEDICATED_PATCH | Freq: Every day | TRANSDERMAL | Status: DC

## 2013-12-08 MED ORDER — PREDNISONE 20 MG PO TABS
40.0000 mg | ORAL_TABLET | Freq: Every day | ORAL | Status: DC
  Filled 2013-12-08: qty 2

## 2013-12-08 MED ORDER — CLONAZEPAM 0.5 MG PO TABS: 0.2500 mg | ORAL_TABLET | Freq: Two times a day (BID) | ORAL | Status: DC

## 2013-12-08 NOTE — Discharge Summary (Signed)
PATIENT DETAILS Name: Caroline Hill Age: 68 y.o. Sex: female Date of Birth: May 17, 1946 MRN: [id number]. Admit Date: 12/01/2013 Admitting Physician: Kinnie Feil, MD WUJ:WJXBJY,NWGNFAO C, MD  Recommendations for Outpatient Follow-up:  1. Optimize COPD medications 2. Minimize sedating medications/polypharmacy 3. Ongoing tobacco counseling.  PRIMARY DISCHARGE DIAGNOSIS:  Principal Problem:   COPD exacerbation Active Problems:   HTN (hypertension)   Tobacco abuse   COPD (chronic obstructive pulmonary disease)   Protein-calorie malnutrition, severe      PAST MEDICAL HISTORY: Past Medical History  Diagnosis Date  . Hypertension   . Bipolar disorder, unspecified   . Unspecified constipation   . Other malaise and fatigue   . Unspecified vitamin D deficiency   . Tobacco use disorder   . Hyperlipidemia   . COPD (chronic obstructive pulmonary disease)   . Shortness of breath     with exertion   . Neuromuscular disorder   . Depression   . CAD (coronary artery disease)   . On home oxygen therapy     "3L at night and during day prn" (12/01/2013)  . Arthritis     "hands, feet" (12/01/2013)  . Schizophrenia     DISCHARGE MEDICATIONS:   Medication List    STOP taking these medications       amLODipine-benazepril 10-20 MG per capsule  Commonly known as:  LOTREL     metoprolol succinate 25 MG 24 hr tablet  Commonly known as:  TOPROL-XL      TAKE these medications       albuterol 108 (90 BASE) MCG/ACT inhaler  Commonly known as:  PROAIR HFA  Inhale 2 puffs into the lungs every 4 (four) hours as needed for wheezing or shortness of breath.     aspirin EC 81 MG tablet  Take 81 mg by mouth daily with breakfast.     benzonatate 200 MG capsule  Commonly known as:  TESSALON  Take 1 capsule (200 mg total) by mouth 3 (three) times daily as needed for cough.     budesonide 0.5 MG/2ML nebulizer solution  Commonly known as:  PULMICORT  Take 0.5 mg by nebulization 3  (three) times daily.     Cholecalciferol 5000 UNITS Tabs  Take 5,000 Units by mouth daily.     clonazePAM 0.5 MG tablet  Commonly known as:  KLONOPIN  Take 0.5 tablets (0.25 mg total) by mouth 2 (two) times daily. Take half tablet twice a day and 1 tablet at bedtime   -  For anxiety     clonazePAM 0.5 MG tablet  Commonly known as:  KLONOPIN  Take 1 tablet (0.5 mg total) by mouth at bedtime.     diltiazem 30 MG tablet  Commonly known as:  CARDIZEM  Take 1 tablet (30 mg total) by mouth 3 (three) times daily.     feeding supplement (ENSURE COMPLETE) Liqd  Take 237 mLs by mouth 2 (two) times daily between meals.     FLUoxetine 20 MG tablet  Commonly known as:  PROZAC  Take 60 mg by mouth daily with breakfast.     furosemide 20 MG tablet  Commonly known as:  LASIX  Take 1 tablet (20 mg total) by mouth daily.     guaiFENesin 600 MG 12 hr tablet  Commonly known as:  MUCINEX  Take 2 tablets (1,200 mg total) by mouth 2 (two) times daily.     HYDROcodone-acetaminophen 5-325 MG per tablet  Commonly known as:  NORCO/VICODIN  Take 1 tablet  by mouth every 8 (eight) hours as needed for moderate pain.     ipratropium 0.02 % nebulizer solution  Commonly known as:  ATROVENT  Take 2.5 mLs (0.5 mg total) by nebulization 3 (three) times daily.     lamoTRIgine 100 MG tablet  Commonly known as:  LAMICTAL  Take 100 mg by mouth daily with breakfast.     levalbuterol 0.63 MG/3ML nebulizer solution  Commonly known as:  XOPENEX  Take 3 mLs (0.63 mg total) by nebulization 3 (three) times daily.     levalbuterol 0.63 MG/3ML nebulizer solution  Commonly known as:  XOPENEX  Take 3 mLs (0.63 mg total) by nebulization every 6 (six) hours as needed for wheezing or shortness of breath.     multivitamin with minerals Tabs tablet  Take 1 tablet by mouth daily.     nicotine 21 mg/24hr patch  Commonly known as:  NICODERM CQ - dosed in mg/24 hours  Place 1 patch (21 mg total) onto the skin daily.       pantoprazole 40 MG tablet  Commonly known as:  PROTONIX  Take 1 tablet (40 mg total) by mouth daily at 12 noon.     predniSONE 5 MG tablet  Commonly known as:  DELTASONE  Label  & dispense according to the schedule below. 10 Pills PO for 3 days then, 8 Pills PO for 3 days, 6 Pills PO for 3 days, 4 Pills PO for 3 days, 2 Pills PO for 3 days, 1 Pills PO for 3 days, 1/2 Pill  PO for 3 days then STOP. Total 95 pills.     risperiDONE 1 MG tablet  Commonly known as:  RISPERDAL  Take 2 tablets (2 mg total) by mouth 2 (two) times daily.     rosuvastatin 10 MG tablet  Commonly known as:  CRESTOR  Take 10 mg by mouth daily with breakfast.     SPIRIVA HANDIHALER 18 MCG inhalation capsule  Generic drug:  tiotropium  Place 18 mcg into inhaler and inhale daily.     traZODone 50 MG tablet  Commonly known as:  DESYREL  Take 2 tablets (100 mg total) by mouth at bedtime.     vitamin B-12 1000 MCG tablet  Commonly known as:  CYANOCOBALAMIN  Take 5,000 mcg by mouth daily with breakfast.        ALLERGIES:   Allergies  Allergen Reactions  . Oxycontin [Oxycodone Hcl]     BRIEF HPI:  See H&P, Labs, Consult and Test reports for all details in brief, Caroline Hill is a 68 y.o. female with PMH of HTN, CAD, COPD, tobacco use, chronic resp failure on home oxygen presented with progressive SOB, associated with productive cough/yellow sputum.  CONSULTATIONS:   pulmonary/intensive care  PERTINENT RADIOLOGIC STUDIES: Dg Chest 2 View  12/01/2013   CLINICAL DATA:  Shortness of breath, cough and congestion with history of COPD, coronary artery disease, and tobacco use  EXAM: CHEST  2 VIEW  COMPARISON:  CT scan of the chest dated August 17, 2011.  FINDINGS: The lungs are mildly hyperinflated. The interstitial markings are coarse bilaterally. There is subtle nodular density that projects just lateral to the right main descending pulmonary artery on the frontal view which is not clearly demonstrated on  the lateral film. This may correspond to the previously demonstrated nodule in the right middle lobe. The cardiopericardial silhouette is normal in size. The pulmonary vascularity is not engorged. There is no pleural effusion or pneumothorax. The observed  portions of the bony thorax exhibit no acute abnormalities.  IMPRESSION: There is hyperinflation consistent with COPD. There is no evidence of pneumonia nor CHF. There is subtle nodular density in the right mid to lower lung on the frontal film which may correspond to previously demonstrated nodule on the CT scan dated August 17, 2011. Follow-up noncontrast CT scanning is recommended to evaluate this area of nodularity for stability or change.   Electronically Signed   By: David  Martinique   On: 12/01/2013 14:02   Ct Angio Chest Pe W/cm &/or Wo Cm  12/02/2013   ADDENDUM REPORT: 12/02/2013 11:45  ADDENDUM: Impression Number 5 as follows:  5. Triangular opacity in the right middle lobe along the minor fissure unchanged compared with 08/17/2011.   Electronically Signed   By: Kathreen Devoid   On: 12/02/2013 11:45   12/02/2013   CLINICAL DATA:  Upper chest pain, productive cough  EXAM: CT ANGIOGRAPHY CHEST WITH CONTRAST  TECHNIQUE: Multidetector CT imaging of the chest was performed using the standard protocol during bolus administration of intravenous contrast. Multiplanar CT image reconstructions and MIPs were obtained to evaluate the vascular anatomy.  CONTRAST:  9mL OMNIPAQUE IOHEXOL 350 MG/ML SOLN  COMPARISON:  None.  FINDINGS: There is adequate opacification of the pulmonary arteries. There is no pulmonary embolus. The main pulmonary artery, right main pulmonary artery and left main pulmonary arteries are normal in size. The heart size is normal. There is no pericardial effusion. There is coronary artery atherosclerosis involving the LAD and circumflex.  There is centrilobular emphysema. There is no focal consolidation. There is no pleural effusion or  pneumothorax. There is bullous disease in the medial aspect of the anterior segment of the there is a 10 x 7 mm triangular opacity in the right middle lobe along the minor fissure. There is reticular nodular interstitial disease at the right lung base and left lung base which may be secondary to infectious or inflammatory etiology. There is bibasilar airspace disease, right greater than left.  There is no axillary, hilar, or mediastinal adenopathy.  There is mild relative esophageal wall thickening as can be seen with esophagitis.  There is no lytic or blastic osseous lesion.  The visualized portions of the upper abdomen are unremarkable.  Review of the MIP images confirms the above findings.  IMPRESSION: 1. No evidence of pulmonary embolus. 2. Bibasilar reticular nodular interstitial disease which may reflect an infectious or inflammatory etiology including atypical infection. There is bibasilar airspace disease which may reflect atelectasis versus developing pneumonia. 3. Coronary artery disease. 4. Mild relative esophageal wall thickening as can be seen with esophagitis. If there is further clinical concern recommend a barium swallow or upper endoscopy.  Electronically Signed: By: Kathreen Devoid On: 12/02/2013 11:33   Dg Chest Port 1 View  12/05/2013   CLINICAL DATA:  Shortness of breath  EXAM: PORTABLE CHEST - 1 VIEW  COMPARISON:  12/03/2013  FINDINGS: Cardiomediastinal silhouette is stable. Mild hyperinflation. Atherosclerotic calcifications of thoracic aorta. Again noted degenerative changes thoracic spine. Mild levoscoliosis. Improvement in aeration right base without focal infiltrate. Mild right infrahilar bronchitic changes.  IMPRESSION: Mild hyperinflation. Atherosclerotic calcifications of thoracic aorta. Again noted degenerative changes thoracic spine. Mild levoscoliosis. Improvement in aeration right base without focal infiltrate. Mild right infrahilar bronchitic changes.   Electronically Signed   By:  Lahoma Crocker M.D.   On: 12/05/2013 11:16   Dg Chest Port 1 View  12/03/2013   CLINICAL DATA:  Shortness of breath. History  of coronary artery disease with angioplasty and stent placement.  EXAM: PORTABLE CHEST - 1 VIEW  COMPARISON:  CT scan of the chest dated 02 Dec 2013 and chest x-ray of Dec 01, 2013  FINDINGS: Mild stable hyperinflation of both lungs. Coarse interstitial markings in the lower lobes bilaterally are more conspicuous today. Cardiac silhouette and pulmonary vascularity within the limits of normal. No pleural effusion or pneumothorax. Gentle mid to lower thoracic levoscoliosis is stable.  IMPRESSION: COPD with bibasilar subsegmental atelectasis or early interstitial pneumonia. The findings are more conspicuous than on the previous chest x-ray.   Electronically Signed   By: David  Martinique   On: 12/03/2013 10:54     PERTINENT LAB RESULTS: CBC: No results found for this basename: WBC, HGB, HCT, PLT,  in the last 72 hours CMET CMP     Component Value Date/Time   NA 139 12/07/2013 0452   K 4.7 12/07/2013 0452   CL 93* 12/07/2013 0452   CO2 41* 12/07/2013 0452   GLUCOSE 151* 12/07/2013 0452   BUN 21 12/07/2013 0452   CREATININE 0.63 12/07/2013 0452   CALCIUM 9.4 12/07/2013 0452   PROT 6.1 12/04/2013 1015   ALBUMIN 2.8* 12/04/2013 1015   AST 13 12/04/2013 1015   ALT 14 12/04/2013 1015   ALKPHOS 66 12/04/2013 1015   BILITOT 0.3 12/04/2013 1015   GFRNONAA >90 12/07/2013 0452   GFRAA >90 12/07/2013 0452    GFR Estimated Creatinine Clearance: 58.8 ml/min (by C-G formula based on Cr of 0.63). No results found for this basename: LIPASE, AMYLASE,  in the last 72 hours No results found for this basename: CKTOTAL, CKMB, CKMBINDEX, TROPONINI,  in the last 72 hours No components found with this basename: POCBNP,  No results found for this basename: DDIMER,  in the last 72 hours No results found for this basename: HGBA1C,  in the last 72 hours No results found for this basename: CHOL, HDL, LDLCALC,  TRIG, CHOLHDL, LDLDIRECT,  in the last 72 hours No results found for this basename: TSH, T4TOTAL, FREET3, T3FREE, THYROIDAB,  in the last 72 hours No results found for this basename: VITAMINB12, FOLATE, FERRITIN, TIBC, IRON, RETICCTPCT,  in the last 72 hours Coags: No results found for this basename: PT, INR,  in the last 72 hours Microbiology: Recent Results (from the past 240 hour(s))  MRSA PCR SCREENING     Status: None   Collection Time    12/04/13  5:34 AM      Result Value Ref Range Status   MRSA by PCR NEGATIVE  NEGATIVE Final   Comment:            The GeneXpert MRSA Assay (FDA     approved for NASAL specimens     only), is one component of a     comprehensive MRSA colonization     surveillance program. It is not     intended to diagnose MRSA     infection nor to guide or     monitor treatment for     MRSA infections.     BRIEF HOSPITAL COURSE:  Acute on Chronic Hypoxic Resp Failure  -secondary to COPD exac,CT Angio Chest negative for Pul Embolism.  [facility] course complicated by development of worsening Hypoxia and Hypecarbia, transferred to SDU and briefly needed BiPAP on 5/21, briefly required High flow O2 as well-rapid improvement-and transferred back to floor on 5/22--continued to treat with tapering Solumedrol,nebs and Levaquin. Much better, very close to usual baseline, ambulating in  the hall and in the room with O2, lungs sounds a lot better, with only a few scattered wheezing.   Acute Metabolic Encephalopathy  -from CO2 narcosis  -resolved with BiPAP-continues to be awake and alert at the time of discharge  COPD exacerbation  -significantly improved, less wheezing and less hypoxic compared to the past few days.Treated with tapering solumedrol, nebs and Levaquin. Will transition to prednisone-slow taper over 2-3 weeks, c/w nebs on discharge. Outpatient follow up with PCCM arranged. Much better at time of discharge, very close to usual baseline, ambulating in the  hall and in the room with O2, lungs sounds a lot better, with only a few scattered wheezing. She is requesting discharge, has good family support at home.  CAP  -seen on CT Chest  -completed Levaquin-day 7-therefore will stop and not prescribe any further antibiotics on discharge -afebrile, non toxic looking. Mild leukocytosis-likely from steroids   Chest Pain  -occurred on 5/21-described it like "pressure"-retrosternal, relieved by sublingual nitroglycerin  -EKG with no acute changes, troponins cycled-negative. Now chest pain free.  -has known hx of CAD-c/w ASA, beta blocker on hold due to COPD exac, will not resume beta blocker on discharge.  Sinus Tachycardia  -suspect secondary to anxiety,albuterol nebs. CT Angio chest neg.Better but intermittently continues-tolerating low dose cardizem.   HTN (hypertension)  - controlled with Cardizem-Continue on discharge  Anxiety  - Continue Klonopin-dose adjusted by PCCM for hypercarbia -continue new dose on discharge  History of CAD  -chest pain this am as above-await trops  - Continue with aspirin, and statins. 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  Objective:   Blood pressure 151/70, pulse 97, temperature 98 F (36.7 C), temperature source Oral, resp. rate 18, height 5\' 7"  (1.702 m), weight 54.6 kg (120 lb 5.9 oz), SpO2 92.00%.  Intake/Output Summary (Last 24 hours) at 12/08/13  1000 Last data filed at 12/08/13 0900  Gross per 24 hour  Intake    240 ml  Output      0 ml  Net    240 ml   Filed Weights   12/01/13 1710 12/03/13 1254 12/04/13 1718  Weight: 48.988 kg (108 lb) 53.3 kg (117 lb 8.1 oz) 54.6 kg (120 lb 5.9 oz)    Exam Awake Alert, Oriented *3, No new F.N deficits, Normal affect Andrews.AT,PERRAL Supple Neck,No JVD, No cervical lymphadenopathy appriciated.  Symmetrical Chest wall movement, Good air movement bilaterally, CTAB RRR,No Gallops,Rubs or new Murmurs, No Parasternal Heave +ve B.Sounds, Abd Soft, Non tender, No organomegaly appriciated, No rebound -guarding or rigidity. No Cyanosis, Clubbing or edema, No new Rash or bruise  DISCHARGE CONDITION: Stable  DISPOSITION: Home with home health services  DISCHARGE INSTRUCTIONS:    Activity:  As tolerated with Full fall precautions use walker/cane & assistance as needed  Diet recommendation: Heart Healthy diet      Discharge Instructions   Call MD for:  difficulty breathing, headache or visual disturbances    Complete by:  As directed      Call MD for:  persistant dizziness or light-headedness    Complete by:  As directed      Diet - low sodium heart healthy    Complete by:  As directed      Increase activity slowly    Complete by:  As directed            Follow-up Information   Follow up with BADGER,MICHAEL C, MD. Schedule an appointment as soon as possible for a visit in 1 week.   Specialty:  Family Medicine   Contact information:   [place] [address] [phone]       Follow up with [facility] [place], NP On 12/18/2013. (appointment at 11:15 am)    Specialty:  Nurse Practitioner   Contact information:   [place]. [place] [address] [phone]      Total Time spent on discharge equals 45 minutes.  Signed: Henreitta Leber Ghimire 12/08/2013 10:00 AM  **Disclaimer: This note may have been dictated with voice recognition software. Similar  sounding words can inadvertently be transcribed and this note may contain transcription errors which may not have been corrected upon publication of note.**

## 2013-12-08 NOTE — Progress Notes (Addendum)
CARE MANAGEMENT NOTE 12/08/2013  Patient:  TANVEER, DOBBERSTEIN   Account Number:  [id number]  Date Initiated:  12/04/2013  Documentation initiated by:  Marvetta Gibbons  Subjective/Objective Assessment:   Pt admitted with resp. distress- COPD     Action/Plan:   PTA pt lived at home- with family  PCP- Anastasia Pall C   Anticipated DC Date:  12/08/2013   Anticipated DC Plan:  [place]  CM consult      Choice offered to / List presented to:  C-1 Patient        [place] arranged  [place]      [place].   Status of service:  Completed, signed off Medicare Important Message given?   (If response is "NO", the following Medicare IM given date fields will be blank) Date Medicare IM given:   Date Additional Medicare IM given:  12/08/2013  Discharge Disposition:  [place]  Per UR Regulation:  Reviewed for med. necessity/level of care/duration of stay  If discussed at Long Length of Stay Meetings, dates discussed:    Comments:  Erenest Rasher, RN Case Manager Signed CASE MANAGEMENT Progress Notes Service date: 12/08/2013 10:46 AM NCM spoke to pt and offered choice for [facility]. Pt requested [facility] for HH. Notified [facility] for [facility] for scheduled dc home today. Pt states she has oxygen at home, and nebulizer machine. Her brother has RW that she can use. Additional Medicare Message given, placed on chart. Jonnie Finner RN CCM Case Mgmt phone [phone]

## 2013-12-08 NOTE — Telephone Encounter (Signed)
Pt was in [facility] admitted 12/01/13 Pt seen by Dr Elsworth Soho in hospital -- [facility] is requesting that O2 be ordered- eval for POC.  Pt has upcoming HFU appt with Tammy Parrett 12/18/13 at 11:15 to be established in office.   Please advise Dr Elsworth Soho. Thanks.

## 2013-12-08 NOTE — Progress Notes (Signed)
Pt given discharge instructions, prescriptions and PIV removed. Pt taken to discharge location via wheelchair.

## 2013-12-09 NOTE — Telephone Encounter (Signed)
OK to order -reassess need on OV

## 2013-12-09 NOTE — Telephone Encounter (Signed)
LMTCB-need to know exactly what DME order should say.

## 2013-12-09 NOTE — Telephone Encounter (Signed)
Spoke with [facility]-Stephanie-she states to place order stating eval for POC needs faxed to [phone]. Col[place] aware that we put orders through our [facility] and they will fax to them.

## 2013-12-18 ENCOUNTER — Ambulatory Visit (INDEPENDENT_AMBULATORY_CARE_PROVIDER_SITE_OTHER): Payer: Medicare HMO | Admitting: Adult Health

## 2013-12-18 ENCOUNTER — Encounter: Payer: Self-pay | Admitting: Adult Health

## 2013-12-18 VITALS — BP 100/70 | HR 80 | Temp 97.7°F | Ht 66.5 in | Wt 121.6 lb

## 2013-12-18 DIAGNOSIS — J441 Chronic obstructive pulmonary disease with (acute) exacerbation: Secondary | ICD-10-CM

## 2013-12-18 MED ORDER — LEVALBUTEROL HCL 0.63 MG/3ML IN NEBU: 0.6300 mg | INHALATION_SOLUTION | RESPIRATORY_TRACT | Status: DC | PRN

## 2013-12-18 MED ORDER — BUDESONIDE 0.5 MG/2ML IN SUSP: 0.5000 mg | Freq: Two times a day (BID) | RESPIRATORY_TRACT | Status: DC

## 2013-12-18 NOTE — Assessment & Plan Note (Signed)
Recent exacerbation now resolving  On home O2 (not on PTA)  Needs PFT for staging  Would Remain off ACE inhibitor and avoid in future as may contribute to recurrent flare with cough  She is on atrovent and spiriva , no need to duplicate  Will cont budesonide , after PFT can consider add brovana or switiching to symibcort ,etc  Change SABA to As needed  For now  Smoking cessation encouraged   Plan  Decrease Budesonide Neb Twice daily  .  May STOP Ipratropium Neb  Taper off prednisone as planned  Continue on Spiriva 1 puff daily  May use Xopenex Neb every 3hrs as needed for wheezing/shortness of breath.  Great job on not smoking . Keep up good work.  Return in 4 weeks with PFT with Dr. Lake Bells  Please contact office for sooner follow up if symptoms do not improve or worsen or seek emergency care

## 2013-12-18 NOTE — Patient Instructions (Addendum)
Decrease Budesonide Neb Twice daily  .  May STOP Ipratropium Neb  Taper off prednisone as planned  Continue on Spiriva 1 puff daily  May use Xopenex Neb every 3hrs as needed for wheezing/shortness of breath.  Great job on not smoking . Keep up good work.  Return in 4 weeks with PFT with Dr. Lake Bells  Please contact office for sooner follow up if symptoms do not improve or worsen or seek emergency care

## 2013-12-18 NOTE — Progress Notes (Signed)
   Subjective:    Patient ID: Caroline Hill, female    DOB: [birth year], 68 y.o.   MRN: [id number]  HPI 68 y/o M former smoker (12/01/13) with PMH oxygen dependent COPD  Managed by her PCP PTA. New PCCM consult during hospital admit on  12/01/13 for AECOPD and Hypercarbic RF w/ mental status changes.    12/18/2013 [facility] follow up  Patient returns for a post hospital followup. Patient was seen for a new PCCM consult during her recent hospitalization- Dec 01 2013 for an acute exacerbation of COPD, and hypercarbic respiratory failure with mental status changes. Patient had been managed for her underlying COPD on home O2 by  Her PCP PTA.  CT chest was negative for PE. She required BiPAP support briefly. She was treated with aggressive IV antibiotics, steroids, and nebulized bronchodilators. Acute encephalopathy, mental status changes were noted and felt secondary to hypercarbia. She was discharged on a prednisone taper. Since discharge. Patient feels that she is improved with decreased cough and congestion. She does have some lingering, yellow mucus, on and off. She denies any hemoptysis, orthopnea, PND, or leg swelling. Patient has not restarted smoking since discharge. Patient's ACE inhibitor was discontinued during her hospitalization.  Patient is currently on budesonide ipratropium and Xopenex nebulizers 3 times daily. Along with Spiriva daily She has a couple days of prednisone left on her taper    Review of Systems Constitutional:   No  weight loss, night sweats,  Fevers, chills,  +fatigue, or  lassitude.  HEENT:   No headaches,  Difficulty swallowing,  Tooth/dental problems, or  Sore throat,                No sneezing, itching, ear ache,  +nasal congestion, post nasal drip,   CV:  No chest pain,  Orthopnea, PND, swelling in lower extremities, anasarca, dizziness, palpitations, syncope.   GI  No heartburn, indigestion, abdominal pain, nausea, vomiting, diarrhea, change in bowel  habits, loss of appetite, bloody stools.   Resp:    No chest wall deformity  Skin: no rash or lesions.  GU: no dysuria, change in color of urine, no urgency or frequency.  No flank pain, no hematuria   MS:  No joint pain or swelling.  No decreased range of motion.  No back pain.  Psych:  No change in mood or affect. No depression or anxiety.  No memory loss.         Objective:   Physical Exam GEN: A/Ox3; pleasant , NAD,  eldelry and frail   HEENT:  [place]/AT,  EACs-clear, TMs-wnl, NOSE-clear, THROAT-clear, no lesions, no postnasal drip or exudate noted.   NECK:  Supple w/ fair ROM; no JVD; normal carotid impulses w/o bruits; no thyromegaly or nodules palpated; no lymphadenopathy.  RESP  Decreased BS in bases  no accessory muscle use, no dullness to percussion  CARD:  RRR, no m/r/g  , no peripheral edema, pulses intact, no cyanosis or clubbing.  GI:   Soft & nt; nml bowel sounds; no organomegaly or masses detected.  Musco: Warm bil, no deformities or joint swelling noted.   Neuro: alert, no focal deficits noted.    Skin: Warm, no lesions or rashes         Assessment & Plan:

## 2013-12-20 NOTE — Progress Notes (Signed)
I agree with above 

## 2014-01-04 ENCOUNTER — Other Ambulatory Visit: Payer: Self-pay | Admitting: Internal Medicine

## 2014-01-11 ENCOUNTER — Other Ambulatory Visit: Payer: Self-pay | Admitting: Internal Medicine

## 2014-01-18 ENCOUNTER — Telehealth: Payer: Self-pay | Admitting: Pulmonary Disease

## 2014-01-18 ENCOUNTER — Other Ambulatory Visit: Payer: Self-pay | Admitting: Pulmonary Disease

## 2014-01-18 DIAGNOSIS — J441 Chronic obstructive pulmonary disease with (acute) exacerbation: Secondary | ICD-10-CM

## 2014-01-18 NOTE — Telephone Encounter (Signed)
Pt aware that she should keep PFT appt as well as 2:00pm appt with BQ tomorrow as this test will help BQ know her lung function as well as if there are any obstructions/restrictions going on. Pt is aware and will keep appts. Pt was given our physical address and phone number to contact us if she should have any troubles arise getting here. Pt was asked to make sure she arrives by 12:45pm to get checked in for PFT. Nothing more needed at this time.

## 2014-01-19 ENCOUNTER — Ambulatory Visit (INDEPENDENT_AMBULATORY_CARE_PROVIDER_SITE_OTHER): Payer: Medicare HMO | Admitting: Pulmonary Disease

## 2014-01-19 ENCOUNTER — Encounter (INDEPENDENT_AMBULATORY_CARE_PROVIDER_SITE_OTHER): Payer: Self-pay

## 2014-01-19 ENCOUNTER — Encounter: Payer: Self-pay | Admitting: Pulmonary Disease

## 2014-01-19 VITALS — BP 142/86 | HR 83 | Ht 66.0 in | Wt 121.0 lb

## 2014-01-19 DIAGNOSIS — R0902 Hypoxemia: Secondary | ICD-10-CM

## 2014-01-19 DIAGNOSIS — J449 Chronic obstructive pulmonary disease, unspecified: Secondary | ICD-10-CM

## 2014-01-19 DIAGNOSIS — J9611 Chronic respiratory failure with hypoxia: Secondary | ICD-10-CM

## 2014-01-19 DIAGNOSIS — Z720 Tobacco use: Secondary | ICD-10-CM

## 2014-01-19 DIAGNOSIS — J441 Chronic obstructive pulmonary disease with (acute) exacerbation: Secondary | ICD-10-CM

## 2014-01-19 DIAGNOSIS — F17200 Nicotine dependence, unspecified, uncomplicated: Secondary | ICD-10-CM

## 2014-01-19 DIAGNOSIS — J9610 Chronic respiratory failure, unspecified whether with hypoxia or hypercapnia: Secondary | ICD-10-CM

## 2014-01-19 LAB — PULMONARY FUNCTION TEST
DL/VA % pred: 49 %
DL/VA: 2.51 ml/min/mmHg/L
DLCO unc % pred: 42 %
DLCO unc: 11.36 ml/min/mmHg
FEF 25-75 PRE: 0.51 L/s
FEF 25-75 Post: 0.62 L/sec
FEF2575-%Change-Post: 22 %
FEF2575-%Pred-Post: 29 %
FEF2575-%Pred-Pre: 24 %
FEV1-%Change-Post: 2 %
FEV1-%PRED-PRE: 48 %
FEV1-%Pred-Post: 49 %
FEV1-Post: 1.25 L
FEV1-Pre: 1.22 L
FEV1FVC-%Change-Post: -6 %
FEV1FVC-%Pred-Pre: 70 %
FEV6-%Change-Post: 7 %
FEV6-%PRED-POST: 74 %
FEV6-%Pred-Pre: 69 %
FEV6-Post: 2.37 L
FEV6-Pre: 2.22 L
FEV6FVC-%Change-Post: -1 %
FEV6FVC-%PRED-PRE: 100 %
FEV6FVC-%Pred-Post: 99 %
FVC-%Change-Post: 8 %
FVC-%PRED-PRE: 68 %
FVC-%Pred-Post: 74 %
FVC-Post: 2.49 L
FVC-Pre: 2.29 L
POST FEV1/FVC RATIO: 50 %
Post FEV6/FVC ratio: 95 %
Pre FEV1/FVC ratio: 53 %
Pre FEV6/FVC Ratio: 97 %
RV % PRED: 134 %
RV: 3.03 L
TLC % PRED: 102 %
TLC: 5.49 L

## 2014-01-19 NOTE — Patient Instructions (Signed)
Use 2 liters of oxygen 24 hours a day, but increase it to 3 L when walking Use your inhalers as your are doing We will refer you to pulmonary rehab Get a flu shot in the fall We will see you back in 3 months

## 2014-01-19 NOTE — Progress Notes (Signed)
Subjective:    Patient ID: Caroline Hill, female    DOB: 19-Dec-1945, 68 y.o.   MRN: [id number]  Synopsis: GOLD Grade D COPD,hospitalized 2015, quit smoking 2015  HPI Chief Complaint  Patient presents with  . Follow-up    Review PFT. Pt states that breathing has been doing okay since last OV. Pt reports having recent fall --was not taken to ED after fall. Multiple bruising on face; small laceration/scrape on nose.   01/19/2014 ROV> Caroline Hill says that she has been doing well since the last visit and has been noticing less shortness of breath since quitting smoking. She has not touched a cigarette in about 3 or four weeks or so.  She has noted less dyspnea on exertion but she still has some when doing things like carrying in groceries or walking up a flight of stairs.  Minimal cough.  She fell yesterday when she tripped in the bathroom and she bruised her face, but she has not had a headache.  She denies feeling unsafe at home or that the facial bruising was related to an attack of any kind.    Past Medical History  Diagnosis Date  . Hypertension   . Bipolar disorder, unspecified   . Unspecified constipation   . Other malaise and fatigue   . Unspecified vitamin D deficiency   . Tobacco use disorder   . Hyperlipidemia   . COPD (chronic obstructive pulmonary disease)   . Shortness of breath     with exertion   . Neuromuscular disorder   . Depression   . CAD (coronary artery disease)   . On home oxygen therapy     "3L at night and during day prn" (12/01/2013)  . Arthritis     "hands, feet" (12/01/2013)  . Schizophrenia       Review of Systems  Constitutional: Positive for fatigue. Negative for fever and chills.  HENT: Negative for postnasal drip, rhinorrhea and sinus pressure.   Respiratory: Positive for shortness of breath. Negative for cough and wheezing.   Cardiovascular: Negative for chest pain, palpitations and leg swelling.       Objective:   Physical Exam Filed Vitals:    01/19/14 1400  BP: 142/86  Pulse: 83  Height: 5\' 6"  (1.676 m)  Weight: 121 lb (54.885 kg)  SpO2: 96%  2L  At rest O2 saturation 80%, improved as above on 2L, needed 3L on exertion to keep above 88%  Gen: feels well  HEENT: facial bruising over nose, cheeks, PERRL, EOMi, OP clear, neck supple without masses PULM: Poor air movement, few crackles in bases otherwise clear CV: RRR, no mgr, no JVD AB: BS+, soft, nontender, no hsm Ext: warm, no edema, no clubbing, no cyanosis Derm: no rash or skin breakdown Neuro: A&Ox4, MAEW        Assessment & Plan:   COPD, severe COPD: GOLD Grade D Combined recommendations from the [place], [employer] of [employer]icians, Investment banker, corporate, European Respiratory Society (Qaseem A et al, Ann Intern Med. 2011;155(3):179) recommends tobacco cessation, pulmonary rehab (for symptomatic patients with an FEV1 < 50% predicted), supplemental oxygen (for patients with SaO2 <88% or paO2 <55), and appropriate bronchodilator therapy.  In regards to long acting bronchodilators, they recommend monotherapy (FEV1 60-80% with symptoms weak evidence, FEV1 with symptoms <60% strong evidence), or combination therapy (FEV1 <60% with symptoms, strong recommendation, moderate evidence).  One should also provide patients with annual immunizations and consider therapy for prevention of  COPD exacerbations (ie. roflumilast or azithromycin) when appopriate.  -O2 therapy: 2 L at rest, 3 with exertion -Immunizations: Advised flu shot in the fall, Prevnar next visit -Tobacco use: Quit 3 weeks ago -Exercise: Pulmonary rehab referral -Bronchodilator therapy: Continue Spiriva daily, Pulmicort bid, xopenex prn -Exacerbation prevention: stay off cigs, pulm rehab, spiriva   Chronic respiratory failure O2: 2L at rest, 3 with exertion  Tobacco abuse I congratulated her on quitting and advised that she stay off them altogether.  Discuss lung  cancer screening next visit    Updated Medication List Outpatient Encounter Prescriptions as of 01/19/2014  Medication Sig  . albuterol (PROAIR HFA) 108 (90 BASE) MCG/ACT inhaler Inhale 2 puffs into the lungs every 4 (four) hours as needed for wheezing or shortness of breath.  Marland Kitchen aspirin EC 81 MG tablet Take 81 mg by mouth daily with breakfast.  . benzonatate (TESSALON) 200 MG capsule Take 1 capsule (200 mg total) by mouth 3 (three) times daily as needed for cough.  . budesonide (PULMICORT) 0.5 MG/2ML nebulizer solution Take 2 mLs (0.5 mg total) by nebulization 2 (two) times daily.  . Cholecalciferol 5000 UNITS TABS Take 5,000 Units by mouth daily.  . clonazePAM (KLONOPIN) 0.5 MG tablet Take 0.5 tablets (0.25 mg total) by mouth 2 (two) times daily. Take half tablet twice a day and 1 tablet at bedtime   -  For anxiety  . diltiazem (CARDIZEM) 30 MG tablet Take 1 tablet (30 mg total) by mouth 3 (three) times daily.  . feeding supplement, ENSURE COMPLETE, (ENSURE COMPLETE) LIQD Take 237 mLs by mouth 2 (two) times daily between meals.  Marland Kitchen FLUoxetine (PROZAC) 20 MG tablet Take 20 mg by mouth daily with breakfast.   . furosemide (LASIX) 20 MG tablet Take 1 tablet (20 mg total) by mouth daily.  Marland Kitchen guaiFENesin (MUCINEX) 600 MG 12 hr tablet Take 2 tablets (1,200 mg total) by mouth 2 (two) times daily.  Marland Kitchen lamoTRIgine (LAMICTAL) 100 MG tablet Take 100 mg by mouth daily with breakfast.   . levalbuterol (XOPENEX) 0.63 MG/3ML nebulizer solution Take 3 mLs (0.63 mg total) by nebulization every 3 (three) hours as needed for wheezing or shortness of breath.  . Multiple Vitamin (MULTIVITAMIN WITH MINERALS) TABS tablet Take 1 tablet by mouth daily.  . pantoprazole (PROTONIX) 40 MG tablet Take 1 tablet (40 mg total) by mouth daily at 12 noon.  . rosuvastatin (CRESTOR) 10 MG tablet Take 10 mg by mouth daily with breakfast.  . SPIRIVA HANDIHALER 18 MCG inhalation capsule Place 18 mcg into inhaler and inhale daily.   .  traZODone (DESYREL) 50 MG tablet Take 50 mg by mouth at bedtime.  . vitamin B-12 (CYANOCOBALAMIN) 1000 MCG tablet Take 5,000 mcg by mouth daily with breakfast.  . [DISCONTINUED] clonazePAM (KLONOPIN) 0.5 MG tablet Take 1 tablet (0.5 mg total) by mouth at bedtime.  . [DISCONTINUED] HYDROcodone-acetaminophen (NORCO/VICODIN) 5-325 MG per tablet Take 1 tablet by mouth every 8 (eight) hours as needed for moderate pain.  . [DISCONTINUED] nicotine (NICODERM CQ - DOSED IN MG/24 HOURS) 21 mg/24hr patch Place 1 patch (21 mg total) onto the skin daily.  . [DISCONTINUED] predniSONE (DELTASONE) 5 MG tablet Label  & dispense according to the schedule below. 10 Pills PO for 3 days then, 8 Pills PO for 3 days, 6 Pills PO for 3 days, 4 Pills PO for 3 days, 2 Pills PO for 3 days, 1 Pills PO for 3 days, 1/2 Pill  PO for 3 days then  STOP. Total 95 pills.  . [DISCONTINUED] risperiDONE (RISPERDAL) 1 MG tablet Take 2 tablets (2 mg total) by mouth 2 (two) times daily.  . [DISCONTINUED] traZODone (DESYREL) 50 MG tablet Take 2 tablets (100 mg total) by mouth at bedtime.

## 2014-01-19 NOTE — Assessment & Plan Note (Signed)
COPD: GOLD Grade D Combined recommendations from the [place], [employer] of Chest Physicians, Investment banker, corporate, [place] (Qaseem A et al, Ann Intern Med. 2011;155(3):179) recommends tobacco cessation, pulmonary rehab (for symptomatic patients with an FEV1 < 50% predicted), supplemental oxygen (for patients with SaO2 <88% or paO2 <55), and appropriate bronchodilator therapy.  In regards to long acting bronchodilators, they recommend monotherapy (FEV1 60-80% with symptoms weak evidence, FEV1 with symptoms <60% strong evidence), or combination therapy (FEV1 <60% with symptoms, strong recommendation, moderate evidence).  One should also provide patients with annual immunizations and consider therapy for prevention of COPD exacerbations (ie. roflumilast or azithromycin) when appopriate.  -O2 therapy: 2 L at rest, 3 with exertion -Immunizations: Advised flu shot in the fall, Prevnar next visit -Tobacco use: Quit 3 weeks ago -Exercise: Pulmonary rehab referral -Bronchodilator therapy: Continue Spiriva daily, Pulmicort bid, xopenex prn -Exacerbation prevention: stay off cigs, pulm rehab, spiriva

## 2014-01-19 NOTE — Assessment & Plan Note (Signed)
O2: 2L at rest, 3 with exertion

## 2014-01-19 NOTE — Progress Notes (Signed)
PFT done today. 

## 2014-01-19 NOTE — Assessment & Plan Note (Addendum)
I congratulated her on quitting and advised that she stay off them altogether.  Discuss lung cancer screening next visit

## 2014-01-21 ENCOUNTER — Telehealth (HOSPITAL_COMMUNITY): Payer: Self-pay

## 2014-01-21 NOTE — Telephone Encounter (Signed)
Called patient regarding entrance to Pulmonary Rehab.  Patient states that they are interested in attending the program.  Nyeemah is going to verify insurance coverage and follow up.

## 2014-01-25 ENCOUNTER — Telehealth (HOSPITAL_COMMUNITY): Payer: Self-pay | Admitting: *Deleted

## 2014-02-01 ENCOUNTER — Telehealth: Payer: Self-pay | Admitting: Pulmonary Disease

## 2014-02-01 MED ORDER — ALBUTEROL SULFATE HFA 108 (90 BASE) MCG/ACT IN AERS: 2.0000 | INHALATION_SPRAY | RESPIRATORY_TRACT | Status: DC | PRN

## 2014-02-01 NOTE — Telephone Encounter (Signed)
Called and spoke with pt and she is requesting a refill of her proair.  This has been sent to the pharmacy and pt is aware. Nothing further is needed.

## 2014-02-08 ENCOUNTER — Telehealth (HOSPITAL_COMMUNITY): Payer: Self-pay | Admitting: *Deleted

## 2014-02-08 ENCOUNTER — Inpatient Hospital Stay (HOSPITAL_COMMUNITY): Admission: RE | Admit: 2014-02-08 | Payer: Medicare HMO | Source: Ambulatory Visit

## 2014-02-26 ENCOUNTER — Encounter (HOSPITAL_COMMUNITY)
Admission: RE | Admit: 2014-02-26 | Discharge: 2014-02-26 | Disposition: A | Discharge status: Home / Self Care | Payer: Medicare HMO | Source: Ambulatory Visit | Attending: Pulmonary Disease | Admitting: Pulmonary Disease

## 2014-02-26 ENCOUNTER — Encounter (HOSPITAL_COMMUNITY): Payer: Self-pay

## 2014-02-26 VITALS — BP 100/56 | HR 79 | Resp 16 | Ht 66.25 in | Wt 121.0 lb

## 2014-02-26 DIAGNOSIS — E785 Hyperlipidemia, unspecified: Secondary | ICD-10-CM | POA: Insufficient documentation

## 2014-02-26 DIAGNOSIS — I779 Disorder of arteries and arterioles, unspecified: Secondary | ICD-10-CM | POA: Insufficient documentation

## 2014-02-26 DIAGNOSIS — J449 Chronic obstructive pulmonary disease, unspecified: Secondary | ICD-10-CM

## 2014-02-26 DIAGNOSIS — Z5189 Encounter for other specified aftercare: Secondary | ICD-10-CM | POA: Insufficient documentation

## 2014-02-26 DIAGNOSIS — I059 Rheumatic mitral valve disease, unspecified: Secondary | ICD-10-CM | POA: Insufficient documentation

## 2014-02-26 NOTE — Progress Notes (Signed)
Caroline Hill 68 y.o. female Pulmonary Rehab Orientation Note Patient arrived today in [facility] for orientation to Pulmonary Rehab. She was transported from [employer] via wheel chair. She does carry portable oxygen. Per pt, she uses oxygen continuously, however patient states she takes it off approx 3 times a day for 5-10 min. Patient took O2 off for initial transfer from wheelchair to weigh. O2 off for 3 min and pt O2 sat dropped to 84%. Patient asymptomatic. O2 sats recovered to 94% with reapplication of 2L and PLB. Patient encouraged to not take breaks from O2 and s/s of hypoxia discussed. Color good, skin warm and dry. Scattered ecchymosis noted to upper extremities. She stated she had a recent fall in the bathtub. Patient is oriented to time and place. Patient's medical history and medications reviewed. Heart rate is normal,upper breath sounds clear to auscultation, no wheezes, rales, or rhonchi. Bilateral fine crackles noted to lower lobes. 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 Distal pulses palpable. Mild pitting edema noted to right lower ext. Patient reports she does take medications as prescribed. Patient states she follows a Regular. The patient reports no specific efforts to gain or lose weight.. Patient's weight will be monitored closely. Demonstration and practice of PLB using pulse oximeter. Patient able to return demonstration satisfactorily. Safety and hand hygiene in the exercise area reviewed with patient. Patient voices understanding of the information reviewed. Department expectations discussed with patient and achievable goals were set. The patient shows enthusiasm about attending the program and we look forward to working with this nice gentleman. The patient is scheduled for a 6 min walk test on 8/18 at 3:30 and to begin exercise on Thursday 8/20 at 10:30.   45 minutes was spent on a variety of activities such as  assessment of the patient, obtaining baseline data including height, weight, BMI, and grip strength, verifying medical history, allergies, and current medications, and teaching patient strategies for performing tasks with less respiratory effort with emphasis on pursed lip breathing.

## 2014-02-26 NOTE — Progress Notes (Signed)
Caroline Hill 68 y.o. female  Initial Psychosocial Assessment  Pt psychosocial assessment reveals pt lives with her brother. Pt is currently retired. Pt hobbies include reading, going for short walks, and boating with her brother. Unfortunately, patients back pain has now prevented her from boating. Pt reports his her stress level is low. Areas of stress/anxiety include Health  and the inability to drive. Patient states she also is saddened because she can't grocery shop.Pt does exhibit signs of depression. Signs of depression include sadness and fatigue. Pt shows fair  coping skills with positive outlook .  Offered emotional support and reassurance. Monitor and evaluate progress toward psychosocial goal(s).  Goal(s): Improved management of depression  Improved coping skills  Help patient work toward returning to meaningful activities that improve patient's QOL and are attainable with patient's lung disease   02/26/2014 12:22 PM

## 2014-03-02 ENCOUNTER — Encounter (HOSPITAL_COMMUNITY)
Admission: RE | Admit: 2014-03-02 | Discharge: 2014-03-02 | Disposition: A | Discharge status: Home / Self Care | Payer: Medicare HMO | Source: Ambulatory Visit | Attending: Pulmonary Disease | Admitting: Pulmonary Disease

## 2014-03-02 NOTE — Progress Notes (Signed)
Caroline Hill completed a [employer] on 03/02/14 . Venetia walked 797 feet with 0 breaks.  Caroline Hill's lowest oxygen saturation was 95% , highest heart rate was 102 bpm , and highest blood pressure was 138/70. Caroline Hill was on 3 liters of oxygen with a nasal cannula. Abel stated that overall fatigue hindered their walk test.

## 2014-03-04 ENCOUNTER — Encounter (HOSPITAL_COMMUNITY)
Admission: RE | Admit: 2014-03-04 | Discharge: 2014-03-04 | Disposition: A | Discharge status: Home / Self Care | Payer: Medicare HMO | Source: Ambulatory Visit | Attending: Pulmonary Disease | Admitting: Pulmonary Disease

## 2014-03-04 DIAGNOSIS — I779 Disorder of arteries and arterioles, unspecified: Secondary | ICD-10-CM | POA: Diagnosis not present

## 2014-03-04 DIAGNOSIS — E785 Hyperlipidemia, unspecified: Secondary | ICD-10-CM | POA: Diagnosis not present

## 2014-03-04 DIAGNOSIS — Z5189 Encounter for other specified aftercare: Secondary | ICD-10-CM | POA: Diagnosis not present

## 2014-03-04 DIAGNOSIS — I059 Rheumatic mitral valve disease, unspecified: Secondary | ICD-10-CM | POA: Diagnosis present

## 2014-03-04 NOTE — Progress Notes (Signed)
Today, Caroline Hill exercised at [employer]. Service time was from 10:30 to 12:30pm.  The patient exercised for more than 31 minutes performing aerobic, strengthening, and stretching exercises. Oxygen saturation, heart rate, blood pressure, rate of perceived exertion, and shortness of breath were all monitored before, during, and after exercise. Caroline Hill presented with no problems at today's exercise session. The patient attended education class with [facility] today regarding oxygen systems.   There was no workload change during today's exercise session.  Pre-exercise vitals:   Weight kg: 37.4   Liters of O2: 2   SpO2: 96   HR: 84   BP: 122/80   CBG: na  Exercise vitals:   Highest heartrate:  97   Lowest oxygen saturation: 97   Highest blood pressure: 138/70   Liters of 02: 2  Post-exercise vitals:   SpO2: 93   HR: 87   BP: 126/64   Liters of O2: 2   CBG: na  Dr. Brand Males, Medical Director Dr. Algis Liming is immediately available during today's Pulmonary Rehab session for Caroline Hill on 03/04/14 at 10:30am class time.

## 2014-03-09 ENCOUNTER — Encounter (HOSPITAL_COMMUNITY)
Admission: RE | Admit: 2014-03-09 | Discharge: 2014-03-09 | Disposition: A | Discharge status: Home / Self Care | Payer: Medicare HMO | Source: Ambulatory Visit | Attending: Pulmonary Disease | Admitting: Pulmonary Disease

## 2014-03-09 DIAGNOSIS — Z5189 Encounter for other specified aftercare: Secondary | ICD-10-CM | POA: Diagnosis not present

## 2014-03-09 NOTE — Progress Notes (Signed)
Today, Caroline Hill exercised at [employer]. Service time was from 10:30am to 12:15pm.  The patient exercised for more than 31 minutes performing aerobic, strengthening, and stretching exercises. Oxygen saturation, heart rate, blood pressure, rate of perceived exertion, and shortness of breath were all monitored before, during, and after exercise. Caroline Hill presented with no problems at today's exercise session.   There was no workload change during today's exercise session.  Pre-exercise vitals:   Weight kg: 56.5   Liters of O2: 2l   SpO2: 91   HR: 97   BP: 94/50   CBG: na  Exercise vitals:   Highest heartrate:  108   Lowest oxygen saturation: 91   Highest blood pressure: 120/60   Liters of 02: 2  Post-exercise vitals:   SpO2: 96   HR: 92   BP: 112/72   Liters of O2: 2   CBG: na  Dr. Brand Males, Medical Director Dr. Algis Liming is immediately available during today's Pulmonary Rehab session for Caroline Hill on 03/09/14 at 10:30am class time.

## 2014-03-11 ENCOUNTER — Encounter (HOSPITAL_COMMUNITY)
Admission: RE | Admit: 2014-03-11 | Discharge: 2014-03-11 | Disposition: A | Discharge status: Home / Self Care | Payer: Medicare HMO | Source: Ambulatory Visit | Attending: Pulmonary Disease | Admitting: Pulmonary Disease

## 2014-03-11 DIAGNOSIS — Z5189 Encounter for other specified aftercare: Secondary | ICD-10-CM | POA: Diagnosis not present

## 2014-03-11 NOTE — Progress Notes (Signed)
Today, Caroline Hill exercised at [employer]. Service time was from 1100 to 1315.  The patient exercised for more than 31 minutes performing aerobic, strengthening, and stretching exercises. Oxygen saturation, heart rate, blood pressure, rate of perceived exertion, and shortness of breath were all monitored before, during, and after exercise. Caroline Hill presented with no problems at today's exercise session. Caroline Hill also attended a q and a with a pulmonary doctor.  There was no workload change during today's exercise session.  Pre-exercise vitals:   Weight kg: 55.1   Liters of O2: 2L   SpO2: 91   HR: 86   BP: 124/60   CBG: na  Exercise vitals:   Highest heartrate:  88   Lowest oxygen saturation: 91   Highest blood pressure: 96/60   Liters of 02: 2L  Post-exercise vitals:   SpO2: 95   HR: 80   BP: 108/60   Liters of O2: 2L   CBG: na  Dr. Brand Males, Medical Director Dr. Broadus John is immediately available during today's Pulmonary Rehab session for Caroline Hill on 03/11/2014 at 1100 class time.

## 2014-03-16 ENCOUNTER — Encounter (HOSPITAL_COMMUNITY)
Admission: RE | Admit: 2014-03-16 | Discharge: 2014-03-16 | Disposition: A | Discharge status: Home / Self Care | Payer: Medicare HMO | Source: Ambulatory Visit | Attending: Pulmonary Disease | Admitting: Pulmonary Disease

## 2014-03-16 DIAGNOSIS — I779 Disorder of arteries and arterioles, unspecified: Secondary | ICD-10-CM | POA: Insufficient documentation

## 2014-03-16 DIAGNOSIS — Z5189 Encounter for other specified aftercare: Secondary | ICD-10-CM | POA: Diagnosis present

## 2014-03-16 DIAGNOSIS — E785 Hyperlipidemia, unspecified: Secondary | ICD-10-CM | POA: Insufficient documentation

## 2014-03-16 DIAGNOSIS — I059 Rheumatic mitral valve disease, unspecified: Secondary | ICD-10-CM | POA: Insufficient documentation

## 2014-03-16 NOTE — Progress Notes (Signed)
Today, Caroline Hill exercised at [employer]. Service time was from 1030 to 1215.  The patient exercised for more than 31 minutes performing aerobic, strengthening, and stretching exercises. Oxygen saturation, heart rate, blood pressure, rate of perceived exertion, and shortness of breath were all monitored before, during, and after exercise. [name] presented with no problems at today's exercise session.   There was no workload change during today's exercise session.  Pre-exercise vitals:   Weight kg: 57.2   Liters of O2: 2L   SpO2: 94   HR: 90   BP: 126/54   CBG: na  Exercise vitals:   Highest heartrate:  91   Lowest oxygen saturation: 93   Highest blood pressure: 100/60   Liters of 02: 2L  Post-exercise vitals:   SpO2: 95   HR: 82   BP: 130/74   Liters of O2: 2L   CBG: na  Dr. Brand Males, Medical Director Dr. Broadus John is immediately available during today's Pulmonary Rehab session for Caroline Hill on 03/16/2014 at 1030 class time.

## 2014-03-18 ENCOUNTER — Encounter (HOSPITAL_COMMUNITY)
Admission: RE | Admit: 2014-03-18 | Discharge: 2014-03-18 | Disposition: A | Discharge status: Home / Self Care | Payer: Medicare HMO | Source: Ambulatory Visit | Attending: Pulmonary Disease | Admitting: Pulmonary Disease

## 2014-03-18 DIAGNOSIS — Z5189 Encounter for other specified aftercare: Secondary | ICD-10-CM | POA: Diagnosis not present

## 2014-03-18 NOTE — Progress Notes (Signed)
Today, Caroline Hill exercised at [employer]. Service time was from 1030 to 1230.  The patient exercised for more than 31 minutes performing aerobic, strengthening, and stretching exercises. Oxygen saturation, heart rate, blood pressure, rate of perceived exertion, and shortness of breath were all monitored before, during, and after exercise. Caroline Hill presented with no problems at today's exercise session. Caroline Hill also attended an education session on home oxygen use and safety.  There was no workload change during today's exercise session.  Pre-exercise vitals:   Weight kg: 57.2   Liters of O2: 2L   SpO2: 95   HR: 89   BP: 122/60   CBG: na  Exercise vitals:   Highest heartrate:  107   Lowest oxygen saturation: 92   Highest blood pressure: 124/66   Liters of 02: 2L  Post-exercise vitals:   SpO2: 96   HR: 90   BP: 114/60   Liters of O2: 2L   CBG: na  Dr. Brand Males, Medical Director Dr. Wendee Beavers is immediately available during today's Pulmonary Rehab session for Caroline Hill on 03/18/2014 at 1030 class time.

## 2014-03-23 ENCOUNTER — Encounter (HOSPITAL_COMMUNITY)
Admission: RE | Admit: 2014-03-23 | Discharge: 2014-03-23 | Disposition: A | Discharge status: Home / Self Care | Payer: Medicare HMO | Source: Ambulatory Visit | Attending: Pulmonary Disease | Admitting: Pulmonary Disease

## 2014-03-23 DIAGNOSIS — Z5189 Encounter for other specified aftercare: Secondary | ICD-10-CM | POA: Diagnosis not present

## 2014-03-23 NOTE — Progress Notes (Signed)
Today, [name] exercised at [employer]. [facility]. Service time was from 10:30 to 12:15.  The patient exercised for more than 31 minutes performing aerobic, strengthening, and stretching exercises. Oxygen saturation, heart rate, blood pressure, rate of perceived exertion, and shortness of breath were all monitored before, during, and after exercise. Caroline Hill presented with no problems at today's exercise session.   There was an increase in workload change during today's exercise session.  Pre-exercise vitals:   Weight kg: 57.7   Liters of O2: ra   SpO2: 94   HR: 92   BP: 112/70   CBG: na  Exercise vitals:   Highest heartrate:  107   Lowest oxygen saturation: 94   Highest blood pressure: 136/66   Liters of 02: 2l  Post-exercise vitals:   SpO2: 97   HR: 97   BP: 104/62   Liters of O2: 2   CBG: na  Dr. Brand Males, Medical Director Dr. Wendee Beavers is immediately available during today's Pulmonary Rehab session for [name] on 03/23/14 at 10:30am class time.

## 2014-03-23 NOTE — Progress Notes (Signed)
The [place] Pulmonary Rehabilitation Baseline Outcomes Assessment   Anthropometrics:    Height (inches): 66 1/4   Weight (kg): 54.9   Grip strength was measured using a Dynamometer.  The patient's highest score was a 16.  Functional Status/Exercise Capacity:   Caroline Hill had a resting heart rate of 79 BPM, a resting blood pressure of 100/56, and an oxygen saturation of 92 % on 2 liters of O2.  Caroline Hill performed a 6-minute walk test on 03/02/14.  The patient completed 797 feet in 6 minutes with 0 rest breaks.  This quantifies 2.15 METS.   Dyspnea Measures:   The [facility] is a simple and standardized method of classifying disability in patients with COPD.  The assessment correlates disability and dyspnea.  At entrance the patient scored a 2. The scale is provided below.   0= I only get breathless with strenuous exercise. 1= I get short of breath when hurrying on level ground or walking up a slight incline. 2= On level ground, I walk slower than people of the same age because of breathlessness, or have to stop for breath when walking at my own pace. 3= I stop for breath after walking 100 yards or after a few minutes on level ground. 4=I am too breathless to leave the house or I am breathless when dressing.     The patient completed the [place] ([facility]).  This questionnaire relates activities of daily living and shortness of breath.  The score ranges from 0-120, a higher score relates to severe shortness of breath during activities of daily living. The patient's score at entrance was 58.  Quality of Life:   Ferrans and Powers Quality of Life Index Pulmonary Version is used to assess the patients satisfaction in different domains of their life; health and functioning, socioeconomic, psychological/spiritual, and family. The overall score is recorded out of 30 points.  The patient's goal is to achieve an overall score of 21  or higher.  Caroline Hill received a 19.91 at entrance.    The Patient Health Questionnaire (PHQ-2) is a first step approach for the screening of depression.  If the patient scores positive on the PHQ-2 the patient should be further assessed with the PHQ-9.  The Patient Health Questionnaire (PHQ-9) assesses the degree of depression.  Depression is important to monitor and track in pulmonary patients due to its prevalence in the population.  If the patient advances to the PHQ-9 the goal is to score less than 4 on this assessment.  Caroline Hill scored a 1 on the PHQ-2 at entrance.  Clinical Assessment Tools:   The COPD Assessment Test (CAT) is a measurement tool to quantify how much of an impact the disease has on the patient's life.  This assessment aids the Pulmonary Rehab Team in designing the patients individualized treatment plan.  A CAT score ranges from 0-40.  A score of 10 or below indicates that COPD has a low impact on the patient's life whereas a score of 30 or higher indicates a severe impact. The patient's goal is a decrease of 1 point from entrance to discharge.  Caroline Hill had a CAT score of 18 at entrance.  Nutrition:   The "Rate My Plate" is a dietary assessment that quantifies the balance of a patient's diet.  This tool allows the Pulmonary Rehab Team to key in on the areas of the patient's diet that needs improving.  The team can then focus their nutritional  education on those areas.  If the patient scores 24-40, this means there are many ways they can make their eating habits healthier, 41-57 states that there are some ways they can make their eating habits healthier and a score of 58-72 states that they are making many healthy choices.  The patient's goal is to achieve a score of 49 or higher on this assessment.  Caroline Hill scored a 66 at entrance.  Oxygen Compliance:   Patient is currently on 2 liters at rest, 0 liters at night, and 3 liters for exercise.  Caroline Hill is not currently using a cpap/bipap at night.   The patient is currently  compliant.  The patient states that they do not have barriers that keep them from using their oxygen.   Education:   Caroline Hill will attend education classes during the course of Pulmonary Rehab.  Education classes that will be offered to the patient are Activities of Daily Living and Energy Conservation, Pursed Lip Breathing and Diaphragmatic Breathing, Nutrition, Exercise for the Pulmonary Patient, Warning Signs of Infection, Chronic Lung Disease, Advanced Directives, Medications, and Stress and Meditation.  The patient completed an assessment at the entrance of the program and will complete it again upon discharge to demonstrate the level of understanding provided by the educational classes.  This assessment includes 14 questions regarding all of the education topics above.  Caroline Hill achieved a score of 8/14 at entrance.  Smoking Cessation:  The patient is not currently smoking.  Exercise:   Caroline Hill will be provided with an individualized Home Exercise Prescription (HEP) at the entrance of the program.  The patient will be followed by the Pulmonary Exercise Physiologist throughout the program to assist with the progression of the frequency, intensity, time, and type of exercise. The patient's long-term goal is to be exercising 30-60 minutes, 3-5 days per week. At entrance, the patient was exercising 0 days at home.

## 2014-03-25 ENCOUNTER — Encounter (HOSPITAL_COMMUNITY)
Admission: RE | Admit: 2014-03-25 | Discharge: 2014-03-25 | Disposition: A | Discharge status: Home / Self Care | Payer: Medicare HMO | Source: Ambulatory Visit | Attending: Pulmonary Disease | Admitting: Pulmonary Disease

## 2014-03-25 DIAGNOSIS — Z5189 Encounter for other specified aftercare: Secondary | ICD-10-CM | POA: Diagnosis not present

## 2014-03-25 NOTE — Progress Notes (Signed)
Today, [name] exercised at [employer]. Service time was from 10:30 to 12:30.  The patient exercised for more than 31 minutes performing aerobic, strengthening, and stretching exercises. Oxygen saturation, heart rate, blood pressure, rate of perceived exertion, and shortness of breath were all monitored before, during, and after exercise. Caroline Hill presented with no problems at today's exercise session.  The patient attended education class on Nutrition with Derek Mound.  There was an increase in workload change during today's exercise session.  Pre-exercise vitals:   Weight kg: 57.0   Liters of O2: 2   SpO2: 94   HR: 90   BP: 120/62   CBG: na  Exercise vitals:   Highest heartrate:  101   Lowest oxygen saturation: 88% increased to 90% with pursed lip breathing   Highest blood pressure: 122/60   Liters of 02: ra  Post-exercise vitals:   SpO2: 95   HR: 78   BP: 122/64   Liters of O2: 2   CBG: na  Dr. Brand Males, Medical Director Dr. Coralyn Pear is immediately available during today's Pulmonary Rehab session for [name] on 03/25/14 at 10:30am class time.

## 2014-03-30 ENCOUNTER — Encounter (HOSPITAL_COMMUNITY)
Admission: RE | Admit: 2014-03-30 | Discharge: 2014-03-30 | Disposition: A | Discharge status: Home / Self Care | Payer: Medicare HMO | Source: Ambulatory Visit | Attending: Pulmonary Disease | Admitting: Pulmonary Disease

## 2014-03-30 DIAGNOSIS — Z5189 Encounter for other specified aftercare: Secondary | ICD-10-CM | POA: Diagnosis not present

## 2014-03-30 NOTE — Progress Notes (Signed)
I have reviewed a Home Exercise Prescription with [name] . Zyniah is currently exercising at home.  The patient was advised to walk 3-5 days a week for 35-40 minutes.  Emilyn and I discussed how to progress their exercise prescription.  The patient stated that their goals were get off her oxygen.  The patient stated that they understand the exercise prescription.  We reviewed exercise guidelines, target heart rate during exercise, oxygen use, weather, home pulse oximeter, endpoints for exercise, and goals.  Patient is encouraged to come to me with any questions. I will continue to follow up with the patient to assist them with progression and safety.

## 2014-03-30 NOTE — Progress Notes (Signed)
Today, Shenekia exercised at [employer]. Service time was from 10:30am to 12:15pm.  The patient exercised for more than 31 minutes performing aerobic, strengthening, and stretching exercises. Oxygen saturation, heart rate, blood pressure, rate of perceived exertion, and shortness of breath were all monitored before, during, and after exercise. Graciela presented with no problems at today's exercise session.   There was an increase in workload change during today's exercise session.  Pre-exercise vitals:   Weight kg: 57.2   Liters of O2: 2   SpO2: 94   HR: 80   BP: 124/78   CBG: na  Exercise vitals:   Highest heartrate:  97   Lowest oxygen saturation: 91%   Highest blood pressure: 140/60   Liters of 02: 2  Post-exercise vitals:   SpO2: 93   HR: 96   BP: 122/72   Liters of O2: 2   CBG: na  Dr. Brand Males, Medical Director Dr. Coralyn Pear is immediately available during today's Pulmonary Rehab session for [name] on 03/30/14 at 10:30am class time.

## 2014-04-01 ENCOUNTER — Encounter (HOSPITAL_COMMUNITY)
Admission: RE | Admit: 2014-04-01 | Discharge: 2014-04-01 | Disposition: A | Discharge status: Home / Self Care | Payer: Medicare HMO | Source: Ambulatory Visit | Attending: Pulmonary Disease | Admitting: Pulmonary Disease

## 2014-04-01 DIAGNOSIS — Z5189 Encounter for other specified aftercare: Secondary | ICD-10-CM | POA: Diagnosis not present

## 2014-04-01 NOTE — Progress Notes (Signed)
Today, [name] exercised at [employer]. Service time was from 1030 to 1230.  The patient exercised for more than 31 minutes performing aerobic, strengthening, and stretching exercises. Oxygen saturation, heart rate, blood pressure, rate of perceived exertion, and shortness of breath were all monitored before, during, and after exercise. Caroline Hill presented with no problems at today's exercise session. Patient attended the exercise for the pulmonary rehab patient class.  There was a workload change during today's exercise session.  Pre-exercise vitals:   Weight kg: 56.7   Liters of O2: 2   SpO2: 94   HR: 86   BP: 120/70   CBG: NA  Exercise vitals:   Highest heartrate:  85   Lowest oxygen saturation: 94   Highest blood pressure: 124/60   Liters of 02: 2  Post-exercise vitals:   SpO2: 99   HR: 71   BP: 102/52   Liters of O2: 2   CBG: NA Dr. Brand Males, Medical Director Dr. Wyline Copas is immediately available during today's Pulmonary Rehab session for [name] on 04/01/2014 at 1030 class time.

## 2014-04-06 ENCOUNTER — Encounter (HOSPITAL_COMMUNITY)
Admission: RE | Admit: 2014-04-06 | Discharge: 2014-04-06 | Disposition: A | Discharge status: Home / Self Care | Payer: Medicare HMO | Source: Ambulatory Visit | Attending: Pulmonary Disease | Admitting: Pulmonary Disease

## 2014-04-06 DIAGNOSIS — Z5189 Encounter for other specified aftercare: Secondary | ICD-10-CM | POA: Diagnosis not present

## 2014-04-06 NOTE — Progress Notes (Signed)
Caroline Hill 68 y.o. female Nutrition Note Spoke with pt. Pt is underweight for a pulmonary pt (normal wt per BMI). Per discussion, pt with slow, chronic wt loss over the past 2 years. Pt states she weighed 150 lb 2 years ago. Pt wt 120.8 lb (54.9 kg). Pt wt is up 4.8 lb over the last year, which is desired. Pt states she is drinking 1 Ensure daily. Pt educated re: need to continue a High Calorie, High Protein diet at this time. Pt does not avoid salty food; uses canned/ convenience food (e.g. [employer] and [employer]). The role of sodium in health/lung disease reviewed with pt. Pt expressed understanding of the information reviewed. Vitals - 1 value per visit 02/26/2014 01/19/2014 12/18/2013 12/08/2013 12/04/2013  Weight (lb) 121.03 121 121.6  120.37   Vitals - 1 value per visit 10/06/2013 03/17/2013 12/07/2011 11/16/2011 11/07/2011  Weight (lb) 118.7 116 126.4  129.1   Vitals - 1 value per visit 09/06/2011 08/13/2011 08/09/2011 07/31/2011  Weight (lb) 129.4 125 125 125.8    Nutrition Diagnosis   Food-and nutrition-related knowledge deficit related to lack of exposure to information as related to diagnosis of pulmonary disease   Increased energy expenditure related to increased energy requirements during COPD exacerbation as evidenced by BMI <20 and recent h/o wt loss.    Nutrition Rx/Est. Daily Nutrition Needs for: ? wt gain 2100-2600 Kcal  85-100 gm protein   1500 mg or less sodium      Nutrition Intervention   Pt's individual nutrition plan and goals reviewed with pt.   Benefits of adopting healthy eating habits discussed when pt's Rate Your Plate reviewed.   Pt to attend the Nutrition and Lung Disease class - met; 03/25/14   Handouts given for: High Calorie, High Protein diet and recipes   Continual client-centered nutrition education by RD, as part of interdisciplinary care. Goal(s) 1. Pt to identify and limit food sources of sodium. 2. The pt will recognize symptoms that can interfere with adequate  oral intake, such as shortness of breath, N/V, early satiety, fatigue, ability to secure and prepare food, taste and smell changes, chewing/swallowing difficulties, and/ or pain when eating. 3. Identify food quantities necessary to achieve wt gain at graduation from pulmonary rehab. Monitor and Evaluate progress toward nutrition goal with team.   Derek Mound, M.Ed, RD, LDN, CDE 04/06/2014 1:15 PM

## 2014-04-06 NOTE — Progress Notes (Signed)
Today, Caroline Hill exercised at [employer]. Service time was from 1030 to 1230.  The patient exercised for more than 31 minutes performing aerobic, strengthening, and stretching exercises. Oxygen saturation, heart rate, blood pressure, rate of perceived exertion, and shortness of breath were all monitored before, during, and after exercise. [name] presented with no problems at today's exercise session.   There was no workload change during today's exercise session.  Pre-exercise vitals:   Weight kg: 57.4   Liters of O2: 2   SpO2: 95   HR: 97   BP: 104/60   CBG: NA  Exercise vitals:   Highest heartrate:  104   Lowest oxygen saturation: 93   Highest blood pressure: 110/60   Liters of 02: 2  Post-exercise vitals:   SpO2: 95   HR: 87   BP: 124/67   Liters of O2: 2   CBG: NA Dr. Brand Males, Medical Director Dr. Wyline Copas is immediately available during today's Pulmonary Rehab session for [name] on 04/06/2014 at 1030 class time.

## 2014-04-08 ENCOUNTER — Encounter (HOSPITAL_COMMUNITY)
Admission: RE | Admit: 2014-04-08 | Discharge: 2014-04-08 | Disposition: A | Discharge status: Home / Self Care | Payer: Medicare HMO | Source: Ambulatory Visit | Attending: Pulmonary Disease | Admitting: Pulmonary Disease

## 2014-04-08 DIAGNOSIS — Z5189 Encounter for other specified aftercare: Secondary | ICD-10-CM | POA: Diagnosis not present

## 2014-04-08 NOTE — Progress Notes (Signed)
Today, Caroline Hill exercised at [employer]. Service time was from 1030 to 1220.  The patient exercised for more than 31 minutes performing aerobic, strengthening, and stretching exercises. Oxygen saturation, heart rate, blood pressure, rate of perceived exertion, and shortness of breath were all monitored before, during, and after exercise. Caroline Hill presented with no problems at today's exercise session. Caroline Hill also attended an education session on warning signs and symptoms.  There was no workload change during today's exercise session.  Pre-exercise vitals:   Weight kg: 57.2   Liters of O2: 2L   SpO2: 94   HR: 83   BP: 130/60   CBG: na  Exercise vitals:   Highest heartrate:  102   Lowest oxygen saturation: 95   Highest blood pressure: 110/60   Liters of 02: 2L  Post-exercise vitals:   SpO2: 96   HR: 83   BP: 106/56   Liters of O2: 2L   CBG: na  Dr. Brand Males, Medical Director Dr. Dyann Kief is immediately available during today's Pulmonary Rehab session for Caroline Hill on 04/08/2014 at 1030 class time.

## 2014-04-13 ENCOUNTER — Encounter (HOSPITAL_COMMUNITY)
Admission: RE | Admit: 2014-04-13 | Discharge: 2014-04-13 | Disposition: A | Discharge status: Home / Self Care | Payer: Medicare HMO | Source: Ambulatory Visit | Attending: Pulmonary Disease | Admitting: Pulmonary Disease

## 2014-04-13 DIAGNOSIS — Z5189 Encounter for other specified aftercare: Secondary | ICD-10-CM | POA: Diagnosis not present

## 2014-04-13 NOTE — Progress Notes (Signed)
Today, Caroline Hill exercised at [employer] [facility]. Service time was from 1030 to 1215.  The patient exercised for more than 31 minutes performing aerobic, strengthening, and stretching exercises. Oxygen saturation, heart rate, blood pressure, rate of perceived exertion, and shortness of breath were all monitored before, during, and after exercise. Caroline Hill presented with no problems at today's exercise session.   There was no workload change during today's exercise session.  Pre-exercise vitals:   Weight kg: 57.2   Liters of O2: 2   SpO2: 93   HR: 96   BP: 114/50   CBG: NA  Exercise vitals:   Highest heartrate:  106   Lowest oxygen saturation: 91   Highest blood pressure: 104/60   Liters of 02: 2  Post-exercise vitals:   SpO2: 95   HR: 84   BP: 104/58   Liters of O2: 2   CBG: NA Dr. Brand Males, Medical Director Dr. Dyann Kief is immediately available during today's Pulmonary Rehab session for Caroline Hill on 04/13/2014 at 1030 class time.

## 2014-04-15 ENCOUNTER — Encounter (HOSPITAL_COMMUNITY)
Admission: RE | Admit: 2014-04-15 | Discharge: 2014-04-15 | Disposition: A | Discharge status: Home / Self Care | Payer: Medicare HMO | Source: Ambulatory Visit | Attending: Pulmonary Disease | Admitting: Pulmonary Disease

## 2014-04-15 DIAGNOSIS — Z5189 Encounter for other specified aftercare: Secondary | ICD-10-CM | POA: Diagnosis not present

## 2014-04-15 DIAGNOSIS — J449 Chronic obstructive pulmonary disease, unspecified: Secondary | ICD-10-CM | POA: Insufficient documentation

## 2014-04-15 NOTE — Psychosocial Assessment (Signed)
Caroline Hill 68 y.o. female  11 day Psychosocial Note  Patient psychosocial assessment reveals no barriers to participation in Pulmonary Rehab. Patient originally thought her inability to drive would be a barrier but she has been able to make every appointment with the help of her brother and best friend. Patient does continue to exhibit positive coping skills to deal with her psychosocial concerns. Offered emotional support and reassurance. Patient does feel she is making progress toward Pulmonary Rehab goals. Patient reports her health and activity level has improved in the past 30 days as evidenced by patient's report of increased ability to walk/exercise outside. She states she is now able to walk 1/2 a mile. She also states it is easier for her to walk around the track. Patient states family/friends have noticed changes in her activity or mood. Patient reports feeling positive about current and projected progression in Pulmonary Rehab. After reviewing the patient's treatment plan, the patient is making progress toward Pulmonary Rehab goal of being able to cook more meals and help her brother with housework, but she is still not able to drive. This is related to the lack of need to drive and anxiety. She feels her anxiety/drepression is getting better. She has had a stylish new haircut and presents to rehab with makeup on. Patient's rate of progress toward rehab goals is good. Plan of action to help patient continue to work towards rehab goals include increasing workloads as tolerated on equipment in order to increase stamina and strength.. Will continue to monitor and evaluate progress toward psychosocial goal(s).  Goal(s) in progress: Improved management of  Depression  Improved coping skills  Help patient work toward returning to meaningful activities that improve patient's QOL and are attainable with patient's lung disease

## 2014-04-15 NOTE — Progress Notes (Signed)
Today, Caroline Hill exercised at [employer]. Service time was from 11:15am to 1:30pm.  The patient exercised for more than 31 minutes performing aerobic, strengthening, and stretching exercises. Oxygen saturation, heart rate, blood pressure, rate of perceived exertion, and shortness of breath were all monitored before, during, and after exercise. Caroline Hill presented with no problems at today's exercise session. The patient attended MD Lecture Day with Dr. Nelda Marseille.  There was no workload change during today's exercise session.  Pre-exercise vitals:   Weight kg: 56.6   Liters of O2: 2   SpO2: 92   HR: 83   BP: 94/60   CBG: na  Exercise vitals:   Highest heartrate:  95   Lowest oxygen saturation: 91   Highest blood pressure: 102/60   Liters of 02: 2  Post-exercise vitals:   SpO2: 94   HR: 92   BP: 126/62   Liters of O2: 2   CBG: na  Dr. Brand Males, Medical Director Dr. Aileen Fass is immediately available during today's Pulmonary Rehab session for Caroline Hill on 04/15/14 at 10:30am class time.

## 2014-04-20 ENCOUNTER — Ambulatory Visit (INDEPENDENT_AMBULATORY_CARE_PROVIDER_SITE_OTHER): Payer: Medicare HMO | Admitting: Pulmonary Disease

## 2014-04-20 ENCOUNTER — Encounter (HOSPITAL_COMMUNITY)
Admission: RE | Admit: 2014-04-20 | Discharge: 2014-04-20 | Disposition: A | Discharge status: Home / Self Care | Payer: Medicare HMO | Source: Ambulatory Visit | Attending: Pulmonary Disease | Admitting: Pulmonary Disease

## 2014-04-20 DIAGNOSIS — Z5189 Encounter for other specified aftercare: Secondary | ICD-10-CM | POA: Diagnosis not present

## 2014-04-20 DIAGNOSIS — J449 Chronic obstructive pulmonary disease, unspecified: Secondary | ICD-10-CM

## 2014-04-20 NOTE — Progress Notes (Signed)
No show

## 2014-04-20 NOTE — Progress Notes (Signed)
Today, Caroline Hill exercised at [employer] [facility]. Service time was from 10:30am to 12:15pm.  The patient exercised for more than 31 minutes performing aerobic, strengthening, and stretching exercises. Oxygen saturation, heart rate, blood pressure, rate of perceived exertion, and shortness of breath were all monitored before, during, and after exercise. Caroline Hill presented with no problems at today's exercise session.   There was no workload change during today's exercise session.  Pre-exercise vitals:   Weight kg: 56.9   Liters of O2: 2   SpO2: 91   HR: 81   BP: 130/60   CBG: na  Exercise vitals:   Highest heartrate:  89   Lowest oxygen saturation: 92   Highest blood pressure: 120/70   Liters of 02: 2  Post-exercise vitals:   SpO2: 93   HR: 80   BP: 102/58   Liters of O2: 2   CBG: na  Dr. Brand Males, Medical Director Dr. Aileen Fass is immediately available during today's Pulmonary Rehab session for Caroline Hill on 04/20/14 at 10:30am class time.

## 2014-04-21 ENCOUNTER — Encounter: Payer: Self-pay | Admitting: Pulmonary Disease

## 2014-04-21 ENCOUNTER — Ambulatory Visit (INDEPENDENT_AMBULATORY_CARE_PROVIDER_SITE_OTHER): Payer: Medicare HMO | Admitting: Pulmonary Disease

## 2014-04-21 VITALS — BP 118/62 | HR 69 | Temp 98.5°F | Ht 66.5 in | Wt 124.4 lb

## 2014-04-21 DIAGNOSIS — Z23 Encounter for immunization: Secondary | ICD-10-CM

## 2014-04-21 DIAGNOSIS — J449 Chronic obstructive pulmonary disease, unspecified: Secondary | ICD-10-CM

## 2014-04-21 DIAGNOSIS — J9611 Chronic respiratory failure with hypoxia: Secondary | ICD-10-CM

## 2014-04-21 MED ORDER — BUDESONIDE 0.5 MG/2ML IN SUSP: 0.5000 mg | Freq: Two times a day (BID) | RESPIRATORY_TRACT | Status: DC

## 2014-04-21 MED ORDER — ARFORMOTEROL TARTRATE 15 MCG/2ML IN NEBU: 15.0000 ug | INHALATION_SOLUTION | Freq: Two times a day (BID) | RESPIRATORY_TRACT | Status: DC

## 2014-04-21 NOTE — Patient Instructions (Signed)
Stop Advair We will change your pulmicort from [facility] and will also have you take another nebulizer called Brovana twice a day with the Pulmicort Keep taking the Spiriva We will see you back in 3 months or sooner if needed

## 2014-04-21 NOTE — Addendum Note (Signed)
Addended by: Virl Cagey on: 04/21/2014 04:30 PM   Modules accepted: Orders

## 2014-04-21 NOTE — Progress Notes (Signed)
Subjective:    Patient ID: Caroline Hill, female    DOB: [birth year], 68 y.o.   MRN: [id number]  Synopsis: GOLD Grade D COPD,hospitalized 2015, quit smoking 2015  HPI  Chief Complaint  Patient presents with  . Follow-up    Pt states that breathing has been doing well since last OV except for when it rained. Pt wants to discuss meds today.   04/21/2014 ROV > ms. Buller said that shen it was raning so much lately she was really having a lot of shortness of breath. Fortunatley it is better now. She wants to know if she can go back on [place], but she said he insurance didn't cover it.  Currently taking advair recommended by her PCP. No flu shot yet this year.  Still using oxygen regularly.  Still in pulmonary rehab, likes it a lot.  Past Medical History  Diagnosis Date  . Hypertension   . Bipolar disorder, unspecified   . Unspecified constipation   . Other malaise and fatigue   . Unspecified vitamin D deficiency   . Tobacco use disorder   . Hyperlipidemia   . COPD (chronic obstructive pulmonary disease)   . Shortness of breath     with exertion   . Neuromuscular disorder   . Depression   . CAD (coronary artery disease)   . On home oxygen therapy     "3L at night and during day prn" (12/01/2013)  . Arthritis     "hands, feet" (12/01/2013)  . Schizophrenia       Review of Systems  Constitutional: Positive for fatigue. Negative for fever and chills.  HENT: Negative for postnasal drip, rhinorrhea and sinus pressure.   Respiratory: Positive for shortness of breath. Negative for cough and wheezing.   Cardiovascular: Negative for chest pain, palpitations and leg swelling.       Objective:   Physical Exam  Filed Vitals:   04/21/14 1500  BP: 118/62  Pulse: 69  Temp: 98.5 F (36.9 C)  TempSrc: Oral  Height: 5' 6.5" (1.689 m)  Weight: 124 lb 6.4 oz (56.427 kg)  SpO2: 94%  2L  At rest O2 saturation 80%, improved as above on 2L, needed 3L on exertion to keep above  88%  Gen: chronically ill appearing, no acute distress HEENT: facial bruising over nose, cheeks, EOMi, OP clear PULM: Poor air movement, wheezes bilaterally CV: RRR, no mgr, no JVD AB: BS+, soft, nontender,  Ext: warm, no edema, no clubbing, no cyanosis Derm: no rash or skin breakdown Neuro: A&Ox4, MAEW       Assessment & Plan:   COPD, severe She has severe COPD but this has been a stable interval for her. She would like to go on a bronchodilator regimen which is more affordable for her. Further, she said that she gained benefit from an inhaled beta agonist when she was taking Dulera. Unfortunately, she has not been able to afford this with her current insurance coverage and she does not feel that the Advair helps.  Plan: -Flu shot today -Prevnar shot today -Stop Advair -Start Brovana through [place] to [place] -Continue Spiriva  Chronic respiratory failure with hypoxia We will change her prescription for oxygen from advanced home care to [place] to consolidate services for her. She should continue 2 L at rest and 3 L with exertion.    Updated Medication List Outpatient Encounter Prescriptions as of 04/21/2014  Medication Sig  . albuterol (PROAIR HFA) 108 (90 BASE) MCG/ACT inhaler Inhale 2  puffs into the lungs every 4 (four) hours as needed for wheezing or shortness of breath.  Marland Kitchen aspirin EC 81 MG tablet Take 81 mg by mouth daily with breakfast.  . budesonide (PULMICORT) 0.5 MG/2ML nebulizer solution Take 2 mLs (0.5 mg total) by nebulization 2 (two) times daily.  . Cholecalciferol 5000 UNITS TABS Take 5,000 Units by mouth daily.  . clonazePAM (KLONOPIN) 0.5 MG tablet Take 0.5 tablets (0.25 mg total) by mouth 2 (two) times daily. Take half tablet twice a day and 1 tablet at bedtime   -  For anxiety  . diltiazem (CARDIZEM) 30 MG tablet Take 1 tablet (30 mg total) by mouth 3 (three) times daily.  . feeding supplement, ENSURE COMPLETE, (ENSURE COMPLETE) LIQD Take  237 mLs by mouth 2 (two) times daily between meals.  Marland Kitchen FLUoxetine (PROZAC) 20 MG tablet Take 20 mg by mouth daily with breakfast.   . guaiFENesin (MUCINEX) 600 MG 12 hr tablet Take 2 tablets (1,200 mg total) by mouth 2 (two) times daily.  Marland Kitchen lamoTRIgine (LAMICTAL) 100 MG tablet Take 100 mg by mouth daily with breakfast.   . levalbuterol (XOPENEX) 0.63 MG/3ML nebulizer solution Take 3 mLs (0.63 mg total) by nebulization every 3 (three) hours as needed for wheezing or shortness of breath.  . Multiple Vitamin (MULTIVITAMIN WITH MINERALS) TABS tablet Take 1 tablet by mouth daily.  . pantoprazole (PROTONIX) 40 MG tablet Take 1 tablet (40 mg total) by mouth daily at 12 noon.  . rosuvastatin (CRESTOR) 10 MG tablet Take 10 mg by mouth daily with breakfast.  . SPIRIVA HANDIHALER 18 MCG inhalation capsule Place 18 mcg into inhaler and inhale daily.   . traZODone (DESYREL) 50 MG tablet Take 50 mg by mouth at bedtime.  . vitamin B-12 (CYANOCOBALAMIN) 1000 MCG tablet Take 5,000 mcg by mouth daily with breakfast.  . [DISCONTINUED] budesonide (PULMICORT) 0.5 MG/2ML nebulizer solution Take 2 mLs (0.5 mg total) by nebulization 2 (two) times daily.  Marland Kitchen arformoterol (BROVANA) 15 MCG/2ML NEBU Take 2 mLs (15 mcg total) by nebulization 2 (two) times daily.  . [DISCONTINUED] benzonatate (TESSALON) 200 MG capsule Take 1 capsule (200 mg total) by mouth 3 (three) times daily as needed for cough.  . [DISCONTINUED] furosemide (LASIX) 20 MG tablet Take 1 tablet (20 mg total) by mouth daily.

## 2014-04-21 NOTE — Addendum Note (Signed)
Addended by: Virl Cagey on: 04/21/2014 03:42 PM   Modules accepted: Orders

## 2014-04-21 NOTE — Assessment & Plan Note (Signed)
We will change her prescription for oxygen from advanced home care to [place] to consolidate services for her. She should continue 2 L at rest and 3 L with exertion.

## 2014-04-21 NOTE — Assessment & Plan Note (Signed)
She has severe COPD but this has been a stable interval for her. She would like to go on a bronchodilator regimen which is more affordable for her. Further, she said that she gained benefit from an inhaled beta agonist when she was taking Dulera. Unfortunately, she has not been able to afford this with her current insurance coverage and she does not feel that the Advair helps.  Plan: -Flu shot today -Prevnar shot today -Stop Advair -Start Brovana through [place] -Change Pulmicort to [place]

## 2014-04-21 NOTE — Addendum Note (Signed)
Addended by: Rosana Berger on: 04/21/2014 03:48 PM   Modules accepted: Orders

## 2014-04-22 ENCOUNTER — Encounter (HOSPITAL_COMMUNITY)
Admission: RE | Admit: 2014-04-22 | Discharge: 2014-04-22 | Disposition: A | Discharge status: Home / Self Care | Payer: Medicare HMO | Source: Ambulatory Visit | Attending: Pulmonary Disease | Admitting: Pulmonary Disease

## 2014-04-22 DIAGNOSIS — Z5189 Encounter for other specified aftercare: Secondary | ICD-10-CM | POA: Diagnosis not present

## 2014-04-22 NOTE — Progress Notes (Signed)
Today, Joelyn exercised at [employer]. Service time was from 1030 to 1230.  The patient exercised for more than 31 minutes performing aerobic, strengthening, and stretching exercises. Oxygen saturation, heart rate, blood pressure, rate of perceived exertion, and shortness of breath were all monitored before, during, and after exercise. Maddeline presented with no problems at today's exercise session. Margaretta also attended an education session on pulmonary medications.  There was no workload change during today's exercise session.  Pre-exercise vitals:   Weight kg: 56.3   Liters of O2: 2L   SpO2: 98   HR: 106   BP: 108/54   CBG: na  Exercise vitals:   Highest heartrate:  111   Lowest oxygen saturation: 91   Highest blood pressure: 128/64   Liters of 02: 2L  Post-exercise vitals:   SpO2: 93   HR: 91   BP: 122/70   Liters of O2: 2L   CBG: na  Dr. Brand Males, Medical Director Dr. Dyann Kief is immediately available during today's Pulmonary Rehab session for [name] on 04/22/2014 at 1030 class time.

## 2014-04-27 ENCOUNTER — Encounter (HOSPITAL_COMMUNITY)
Admission: RE | Admit: 2014-04-27 | Discharge: 2014-04-27 | Disposition: A | Discharge status: Home / Self Care | Payer: Medicare HMO | Source: Ambulatory Visit | Attending: Pulmonary Disease | Admitting: Pulmonary Disease

## 2014-04-27 DIAGNOSIS — Z5189 Encounter for other specified aftercare: Secondary | ICD-10-CM | POA: Diagnosis not present

## 2014-04-27 NOTE — Psychosocial Assessment (Signed)
Staff MD, Rehab Director Note/Attestation  Reviewed pshyosocial assessment. Agree with goal  Laid out by the staff of rehab  Dr. Brand Males, M.D., [facility].C.P Pulmonary and Critical Care Medicine Staff Physician and Director of [place] [facility] Pager: [phone], If no answer or between  15:00h - 7:00h: call [phone]  04/27/2014 12:55 PM

## 2014-04-27 NOTE — Progress Notes (Signed)
Today, Caroline Hill exercised at [employer]. Service time was from 10:30am to 12:00pm.  The patient exercised for more than 31 minutes performing aerobic, strengthening, and stretching exercises. Oxygen saturation, heart rate, blood pressure, rate of perceived exertion, and shortness of breath were all monitored before, during, and after exercise. Caroline Hill presented with no problems at today's exercise session.   There was no workload change during today's exercise session.  Pre-exercise vitals:   Weight kg: 57.7   Liters of O2: 2   SpO2: 91   HR: 84   BP: 116/54   CBG: na  Exercise vitals:   Highest heartrate:  99   Lowest oxygen saturation: 92   Highest blood pressure: 110/60   Liters of 02: 2  Post-exercise vitals:   SpO2: 93   HR: 83   BP: 104/72   Liters of O2: 2   CBG: na  Dr. Brand Males, Medical Director Dr. Dyann Kief is immediately available during today's Pulmonary Rehab session for Caroline Hill on 04/27/14 at 10:30am class time.

## 2014-04-29 ENCOUNTER — Encounter (HOSPITAL_COMMUNITY)
Admission: RE | Admit: 2014-04-29 | Discharge: 2014-04-29 | Disposition: A | Discharge status: Home / Self Care | Payer: Medicare HMO | Source: Ambulatory Visit | Attending: Pulmonary Disease | Admitting: Pulmonary Disease

## 2014-04-29 DIAGNOSIS — Z5189 Encounter for other specified aftercare: Secondary | ICD-10-CM | POA: Diagnosis not present

## 2014-04-29 NOTE — Progress Notes (Addendum)
Today, Caroline Hill exercised at [employer]. Service time was from 1030 to 1230.  The patient exercised for more than 31 minutes performing aerobic, strengthening, and stretching exercises. Oxygen saturation, heart rate, blood pressure, rate of perceived exertion, and shortness of breath were all monitored before, during, and after exercise. Caroline Hill presented with no problems at today's exercise session. Caroline Hill also attended an education session on oxygen use and safety.  There was no workload change during today's exercise session.  Pre-exercise vitals:   Weight kg: 58.7   Liters of O2: 2L   SpO2: 96   HR: 85   BP: 126/64   CBG: na  Exercise vitals:   Highest heartrate:  84   Lowest oxygen saturation: 95   Highest blood pressure: 134/70   Liters of 02: 2L  Post-exercise vitals:   SpO2: 97   HR: 84   BP: 130/72   Liters of O2: 2L   CBG: na  Dr. Brand Males, Medical Director Dr. Aileen Fass is immediately available during today's Pulmonary Rehab session for Caroline Hill on 04/29/2014 at 1030 class time.

## 2014-05-04 ENCOUNTER — Encounter (HOSPITAL_COMMUNITY)
Admission: RE | Admit: 2014-05-04 | Discharge: 2014-05-04 | Disposition: A | Discharge status: Home / Self Care | Payer: Medicare HMO | Source: Ambulatory Visit | Attending: Pulmonary Disease | Admitting: Pulmonary Disease

## 2014-05-04 DIAGNOSIS — Z5189 Encounter for other specified aftercare: Secondary | ICD-10-CM | POA: Diagnosis not present

## 2014-05-04 NOTE — Progress Notes (Signed)
Today, Legend exercised at [employer]. Service time was from 1030 to 1215.  The patient exercised for more than 31 minutes performing aerobic, strengthening, and stretching exercises. Oxygen saturation, heart rate, blood pressure, rate of perceived exertion, and shortness of breath were all monitored before, during, and after exercise. Preciosa presented with no problems at today's exercise session.   There was no workload change during today's exercise session.  Pre-exercise vitals:   Weight kg: 58.5   Liters of O2: 2   SpO2: 93   HR: 94   BP: 122/70   CBG: NA  Exercise vitals:   Highest heartrate:  98   Lowest oxygen saturation: 92   Highest blood pressure: 118/70   Liters of 02: 2  Post-exercise vitals:   SpO2: 92   HR: 91   BP: 108/60   Liters of O2: 2   CBG: NA Dr. Brand Males, Medical Director Dr. Aileen Fass is immediately available during today's Pulmonary Rehab session for [name] on 05/04/2014 at 1030 class time.

## 2014-05-06 ENCOUNTER — Encounter (HOSPITAL_COMMUNITY)
Admission: RE | Admit: 2014-05-06 | Discharge: 2014-05-06 | Disposition: A | Discharge status: Home / Self Care | Payer: Medicare HMO | Source: Ambulatory Visit | Attending: Pulmonary Disease | Admitting: Pulmonary Disease

## 2014-05-06 DIAGNOSIS — Z5189 Encounter for other specified aftercare: Secondary | ICD-10-CM | POA: Diagnosis not present

## 2014-05-06 NOTE — Progress Notes (Signed)
Today, Caroline Hill exercised at [employer]. Service time was from 10:30am to 12:30pm.  The patient exercised for more than 31 minutes performing aerobic, strengthening, and stretching exercises. Oxygen saturation, heart rate, blood pressure, rate of perceived exertion, and shortness of breath were all monitored before, during, and after exercise. Caroline Hill presented with no problems at today's exercise session. The patient attended education class today with [facility] regarding Oxygen Use and Delivery Systems.  There was no workload change during today's exercise session.  Pre-exercise vitals:   Weight kg: 58.6   Liters of O2: 2   SpO2: 95   HR: 85   BP: 120/50   CBG: na  Exercise vitals:   Highest heartrate:  82   Lowest oxygen saturation: 93   Highest blood pressure: 124/70   Liters of 02: 2  Post-exercise vitals:   SpO2: 95   HR: 83   BP: 126/70   Liters of O2: 2   CBG: na  Dr. Brand Males, Medical Director Dr. Wynelle Cleveland is immediately available during today's Pulmonary Rehab session for Caroline Hill on 05/06/14 at 10:30am class time.

## 2014-05-10 ENCOUNTER — Telehealth (HOSPITAL_COMMUNITY): Payer: Self-pay | Admitting: *Deleted

## 2014-05-11 ENCOUNTER — Encounter (HOSPITAL_COMMUNITY)
Admission: RE | Admit: 2014-05-11 | Discharge: 2014-05-11 | Disposition: A | Discharge status: Home / Self Care | Payer: Medicare HMO | Source: Ambulatory Visit | Attending: Pulmonary Disease | Admitting: Pulmonary Disease

## 2014-05-11 DIAGNOSIS — Z5189 Encounter for other specified aftercare: Secondary | ICD-10-CM | POA: Diagnosis not present

## 2014-05-11 NOTE — Psychosocial Assessment (Signed)
Caroline Hill 68 y.o. female 15 day and Final Psychosocial Note  Patient psychosocial assessment reveals no barriers to participation in Pulmonary Rehab.   Today is [name]'s last day in pulmonary rehab, she graduated.  She has worked hard and was a pleasure to have in the program.   Patient feels she has made progress toward Pulmonary Rehab goals. Patient reports her health and activity level has improved in the past 30 days as evidenced by patient's report of increased ability to help with meal preparation and housework which was one of the patient's goals for pulmonary rehab. Patient states family/friends have noticed changes in her activity or mood. Patient reports  feeling positive about current  progression in Pulmonary Rehab. After reviewing the patient's treatment plan, the patient has met her Pulmonary Rehab goals. Patient's rate of progress toward rehab goals has been excellent.    Goal(s) completed: Asti has increased her stamina and strength so as to help with meal preparation and housework. Drive her car and be more active.

## 2014-05-11 NOTE — Progress Notes (Signed)
Caroline Hill completed a [employer] on 05/11/14 . Caroline Hill walked 1,146 feet with 0 breaks.  The patient's lowest oxygen saturation was 91% , highest heart rate was 110 bpm , and highest blood pressure was 138/62. The patient was on 3 liters of oxygen with a nasal cannula. Caroline Hill stated that nothing hindered her walk test.

## 2014-05-13 ENCOUNTER — Encounter (HOSPITAL_COMMUNITY): Payer: Medicare HMO

## 2014-05-13 NOTE — Psychosocial Assessment (Signed)
   Staff MD, Rehab Director Note/Attestation  Reviewed pshyosocial assessment. Agree with goal  Laid out by the staff of rehab  Dr. Brand Males, M.D., [facility].C.P Pulmonary and Critical Care Medicine Staff Physician and Director of [place] [facility] Pager: [phone], If no answer or between  15:00h - 7:00h: call [phone]  05/13/2014 4:40 AM

## 2014-05-17 NOTE — Progress Notes (Signed)
[facility] [place] Pulmonary Rehabilitation Final/Discharge Outcome Results  Anthropometrics: . Height (inches): 66.25 . Weight (kg): 56.4 ? This is a change of +1.5kg from entrance.  Functional Status/Exercise Capacity: . Caroline Hill had a resting heart rate of 90 BPM, a resting blood pressure of 104/50, and an oxygen saturation of 94% % on 2 liters of O2.  Caroline Hill performed a discharge 6-minute walk test on 03/02/14.  The patient completed 1,146 feet in 6 minutes with 0 rest breaks.  This quantifies 2.61 METS. The patient's goal is to add 82 feet onto the baseline .   ? The patient increased their 6-minute walk test distance by 349 feet and their MET level by +0.46 METs.  Dyspnea Measures: . The [facility] is a simple and standardized method of classifying disability in patients with COPD.  The assessment correlates disability and dyspnea.  Upon discharge the patients resting score was 2. The scale is provided below.  ? This is a change of 0 from entrance.  0= I only get breathless with strenuous exercise. 1= I get short of breath when hurrying on level ground or walking up a slight incline. 2= On level ground, I walk slower than people of the same age because of breathlessness, or have to stop for breath when walking at my own pace. 3= I stop for breath after walking 100 yards or after a few minutes on level ground. 4=I am too breathless to leave the house or I am breathless when dressing.   . The patient completed the [facility] Shortness Of Breath Questionnaire ([facility]).  This questionnaire relates activities of daily living and shortness of breath.  The score ranges from 0-120, a higher score relates to severe shortness of breath during activities of daily living. The patient's score at discharge was 59. ? This is a change of +1 from entrance.  Quality of Life: . Caroline Hill and Caroline Hill Quality of Life Index Pulmonary Version is used to assess the patients  satisfaction in different domains of their life; health and functioning, socioeconomic, psychological/spiritual, and family. The overall score is recorded out of 30 points.  The patient's goal is to achieve an overall score of 21 or higher.  Caroline Hill received a 23.47 upon discharge.  ? This is a change of +3.56 from entrance.  . The Patient Health Questionnaire (PHQ-2) is a first step approach for the screening of depression.  If the patient scores positive on the PHQ-2 the patient should be further assessed with the PHQ-9.  The Patient Health Questionnaire (PHQ-9) assesses the degree of depression.  Depression is important to monitor and track in pulmonary patients due to its prevalence in the population.  If the patient advances to the PHQ-9 the goal is to score less than 4 on this assessment.  Caroline Hill scored a 1 on the PHQ-2 at discharge. ? This is a change of 0 from entrance.   Clinical Assessment Tools: . The COPD Assessment Test (CAT) is a measurement tool to quantify how much of an impact the disease has on the patient's life.  This assessment aids the Pulmonary Rehab Team in designing the patients individualized treatment plan.  A CAT score ranges from 0-40.  A score of 10 or below indicates that COPD has a low impact on the patient's life whereas a score of 30 or higher indicates a severe impact. The patient's goal is a decrease of 1 point from entrance to discharge.  Caroline Hill had a CAT score  of 24 upon discharge.   ? This is a change of +6 from entrance.  Nutrition: . The "Rate My Plate" is a dietary assessment that quantifies the balance of a patient's diet.  This tool allows the Pulmonary Rehab Team to key in on the areas of the patient's diet that needs improving.  The team can then focus their nutritional education on those areas.  If the patient scores 24-40, this means there are many ways they can make their eating habits healthier, 41-57 states that there are some ways they can make their eating  habits healthier and a score of 58-72 states that they are making many healthy choices.  The patient's goal is to achieve a score of 49 or higher on this assessment.  Caroline Hill scored a 76 upon discharge.   ? This is a change of +10 from entrance.  Oxygen Compliance: . Patient is currently on 2 liters at rest and 3 liters for exercise.  Caroline Hill is not currently using cpap/bipap at night.  The patient is currently compliant and noncompliant.  The patient states that they do have barriers that keep them from using their oxygen.  Caroline Hill states that her number one barrier was that the oxygen tank was too heavy for her and it was a lot to carry on her shoulder.   ? This is a no change result from entrance.    Education: . Caroline Hill attended more than 75% of the education classes.  Caroline Hill completed a discharge educational assessment and achieved a score of 10/14.  ? This is a change of +2 from entrance.   Smoking Cessation:  The patient is not currently smoking.  Exercise: Caroline Hill Kitchen Caroline Hill was provided with an individualized Home Exercise Prescription (HEP) at the entrance of the program.  The patient's goal is to be exercising 30-60 minutes, 3-5 days per week. Upon discharge the patient is exercising 4-5 days at home.  This is a change of + 4-5 days from entrance.  After graduation from Pulmonary Rehab, Caroline Hill will continue exercising at home.

## 2014-05-18 ENCOUNTER — Telehealth: Payer: Self-pay | Admitting: *Deleted

## 2014-05-18 ENCOUNTER — Encounter (HOSPITAL_COMMUNITY): Payer: Medicare HMO

## 2014-05-18 NOTE — Progress Notes (Signed)
Discharge Note: Caroline Hill has been discharge from pulmonary rehabilitation after successful completion of the program. Caroline Hill participated in 20 exercise/education session. Caroline Hill tolerated an increase in work load on the equipment and increased her footage on her post walk test by 349 feet. Caroline Hill accomplished one of her goals of being able to cook more meals and having the strength and stamina to help her brother with some of the housework. She also wanted to be able to begin driving again, but did not accomplish that goal while she was here. Caroline Hill stated at discharge that she had began an exercise regimen at home by walking in her neighborhood. It was expressed to [facility] how important it is to wear her oxygen with any exertion. Teach back noted.

## 2014-05-18 NOTE — Telephone Encounter (Signed)
ERROR/CLOSE

## 2014-05-20 ENCOUNTER — Encounter (HOSPITAL_COMMUNITY): Payer: Medicare HMO

## 2014-05-25 ENCOUNTER — Encounter (HOSPITAL_COMMUNITY): Payer: Medicare HMO

## 2014-08-27 ENCOUNTER — Encounter: Payer: Self-pay | Admitting: Pulmonary Disease

## 2014-08-27 ENCOUNTER — Ambulatory Visit (INDEPENDENT_AMBULATORY_CARE_PROVIDER_SITE_OTHER): Payer: Medicare HMO | Admitting: Pulmonary Disease

## 2014-08-27 VITALS — BP 124/64 | HR 90 | Ht 66.5 in | Wt 121.0 lb

## 2014-08-27 DIAGNOSIS — J9611 Chronic respiratory failure with hypoxia: Secondary | ICD-10-CM | POA: Diagnosis not present

## 2014-08-27 DIAGNOSIS — Z720 Tobacco use: Secondary | ICD-10-CM

## 2014-08-27 DIAGNOSIS — J449 Chronic obstructive pulmonary disease, unspecified: Secondary | ICD-10-CM

## 2014-08-27 DIAGNOSIS — F17210 Nicotine dependence, cigarettes, uncomplicated: Secondary | ICD-10-CM

## 2014-08-27 MED ORDER — ALBUTEROL SULFATE HFA 108 (90 BASE) MCG/ACT IN AERS: 2.0000 | INHALATION_SPRAY | RESPIRATORY_TRACT | Status: DC | PRN

## 2014-08-27 NOTE — Progress Notes (Signed)
Subjective:    Patient ID: Caroline Hill, female    DOB: 1946/01/11, 69 y.o.   MRN: [id number]  Synopsis: GOLD Grade D COPD,hospitalized 2015, quit smoking 2015 but started again in January 2016  HPI Chief Complaint  Patient presents with  . Follow-up    pt has no breathing complaints at this time.  Pt started smoking again since last visit. CAT score 20.   [name] says she has been doing well re ently.  She started to smoke again since the last visit unfortunately.  She is smoking three cigs a day.  She says that it has been about a month ago.  She says it is really hard to quit.  Her breathing has been doing OK but she thinks she would be doing better if she wasn't smoking. She continues to use and benefit from her oxygen.  She is taking her nebulizers bid and her spiriva.    Past Medical History  Diagnosis Date  . Hypertension   . Bipolar disorder, unspecified   . Unspecified constipation   . Other malaise and fatigue   . Unspecified vitamin D deficiency   . Tobacco use disorder   . Hyperlipidemia   . COPD (chronic obstructive pulmonary disease)   . Shortness of breath     with exertion   . Neuromuscular disorder   . Depression   . CAD (coronary artery disease)   . On home oxygen therapy     "3L at night and during day prn" (12/01/2013)  . Arthritis     "hands, feet" (12/01/2013)  . Schizophrenia       Review of Systems  Constitutional: Positive for fatigue. Negative for fever and chills.  HENT: Negative for postnasal drip, rhinorrhea and sinus pressure.   Respiratory: Positive for shortness of breath. Negative for cough and wheezing.   Cardiovascular: Negative for chest pain, palpitations and leg swelling.       Objective:   Physical Exam Filed Vitals:   08/27/14 1334  BP: 124/64  Pulse: 90  Height: 5' 6.5" (1.689 m)  Weight: 121 lb (54.885 kg)  SpO2: 92%  2L   Gen: chronically ill appearing, no acute distress HEENT: EOMi, OP clear PULM: Good air  movement, wheezes bilaterally CV: RRR, slight systolic murmur, no JVD AB: BS+, soft, nontender,  Ext: warm, no edema, no clubbing, no cyanosis Derm: no rash or skin breakdown Neuro: A&Ox4, MAEW       Assessment & Plan:   COPD, severe Its disappointing to me that Dorine started smoking again because she has severe COPD requiring oxygen use. Overall she has severe disease with a fairly poor prognosis. I don't think she is end-stage right now because she remains fairly active. But if she continues to smoke in her life expectancy is going to be significantly shorter. I explained this to her at length today.  I am happy that she is compliant with her medications and her oxygen.  Plan: -Educated at length to quit smoking, see below -Continue Pulmicort and Brovana -Continue Spiriva -Follow-up 3-4 months   Chronic respiratory failure with hypoxia She continues to use and benefit from her oxygen  Continue 2 L of oxygen at rest, 3 with exertion   Tobacco abuse Today I counseled her at length on the importance of quitting smoking. She is not interested in using Chantix because of the psychiatric side effects. She wants to quit smoking but she says it is just "too hard". I advised her today that  I think it would be helpful if she joined a smoking cessation class so that she could meet other people who are also struggling to quit smoking. Her health system off her such classes and so I gave her contact information for the same.     Updated Medication List Outpatient Encounter Prescriptions as of 08/27/2014  Medication Sig  . albuterol (PROAIR HFA) 108 (90 BASE) MCG/ACT inhaler Inhale 2 puffs into the lungs every 4 (four) hours as needed for wheezing or shortness of breath.  Marland Kitchen arformoterol (BROVANA) 15 MCG/2ML NEBU Take 2 mLs (15 mcg total) by nebulization 2 (two) times daily.  Marland Kitchen aspirin EC 81 MG tablet Take 81 mg by mouth daily with breakfast.  . budesonide (PULMICORT) 0.5 MG/2ML nebulizer  solution Take 2 mLs (0.5 mg total) by nebulization 2 (two) times daily.  . Cholecalciferol 5000 UNITS TABS Take 5,000 Units by mouth daily.  . clonazePAM (KLONOPIN) 0.5 MG tablet Take 0.5 tablets (0.25 mg total) by mouth 2 (two) times daily. Take half tablet twice a day and 1 tablet at bedtime   -  For anxiety  . diltiazem (CARDIZEM) 30 MG tablet Take 1 tablet (30 mg total) by mouth 3 (three) times daily.  . feeding supplement, ENSURE COMPLETE, (ENSURE COMPLETE) LIQD Take 237 mLs by mouth 2 (two) times daily between meals.  Marland Kitchen FLUoxetine (PROZAC) 20 MG tablet Take 20 mg by mouth daily with breakfast.   . guaiFENesin (MUCINEX) 600 MG 12 hr tablet Take 2 tablets (1,200 mg total) by mouth 2 (two) times daily.  Marland Kitchen lamoTRIgine (LAMICTAL) 100 MG tablet Take 100 mg by mouth daily with breakfast.   . levalbuterol (XOPENEX) 0.63 MG/3ML nebulizer solution Take 3 mLs (0.63 mg total) by nebulization every 3 (three) hours as needed for wheezing or shortness of breath.  . Multiple Vitamin (MULTIVITAMIN WITH MINERALS) TABS tablet Take 1 tablet by mouth daily.  . pantoprazole (PROTONIX) 40 MG tablet Take 1 tablet (40 mg total) by mouth daily at 12 noon.  . rosuvastatin (CRESTOR) 10 MG tablet Take 10 mg by mouth daily with breakfast.  . SPIRIVA HANDIHALER 18 MCG inhalation capsule Place 18 mcg into inhaler and inhale daily.   . traZODone (DESYREL) 50 MG tablet Take 50 mg by mouth at bedtime.  . vitamin B-12 (CYANOCOBALAMIN) 1000 MCG tablet Take 5,000 mcg by mouth daily with breakfast.  . [DISCONTINUED] albuterol (PROAIR HFA) 108 (90 BASE) MCG/ACT inhaler Inhale 2 puffs into the lungs every 4 (four) hours as needed for wheezing or shortness of breath.

## 2014-08-27 NOTE — Patient Instructions (Signed)
Call [phone] to get information on the smoking cessation classes through [facility] Quit smoking! Keep taking your medicine as you are doing (nebulizers, Spiriva) We will see you back in 4 months or sooner if needed

## 2014-08-28 NOTE — Assessment & Plan Note (Signed)
Today I counseled her at length on the importance of quitting smoking. She is not interested in using Chantix because of the psychiatric side effects. She wants to quit smoking but she says it is just "too hard". I advised her today that I think it would be helpful if she joined a smoking cessation class so that she could meet other people who are also struggling to quit smoking. Her health system off her such classes and so I gave her contact information for the same.

## 2014-08-28 NOTE — Assessment & Plan Note (Signed)
Its disappointing to me that Caroline Hill started smoking again because she has severe COPD requiring oxygen use. Overall she has severe disease with a fairly poor prognosis. I don't think she is end-stage right now because she remains fairly active. But if she continues to smoke in her life expectancy is going to be significantly shorter. I explained this to her at length today.  I am happy that she is compliant with her medications and her oxygen.  Plan: -Educated at length to quit smoking, see below -Continue Pulmicort and Brovana -Continue Spiriva -Follow-up 3-4 months

## 2014-08-28 NOTE — Assessment & Plan Note (Signed)
She continues to use and benefit from her oxygen  Continue 2 L of oxygen at rest, 3 with exertion

## 2014-08-31 ENCOUNTER — Telehealth: Payer: Self-pay | Admitting: Pulmonary Disease

## 2014-08-31 MED ORDER — BUDESONIDE 0.5 MG/2ML IN SUSP: 0.5000 mg | Freq: Two times a day (BID) | RESPIRATORY_TRACT | Status: DC

## 2014-08-31 MED ORDER — ARFORMOTEROL TARTRATE 15 MCG/2ML IN NEBU: 15.0000 ug | INHALATION_SOLUTION | Freq: Two times a day (BID) | RESPIRATORY_TRACT | Status: DC

## 2014-08-31 NOTE — Telephone Encounter (Signed)
Rx's have been sent in. Pt is aware. 

## 2014-11-25 ENCOUNTER — Encounter: Payer: Self-pay | Admitting: Internal Medicine

## 2014-11-25 ENCOUNTER — Ambulatory Visit (INDEPENDENT_AMBULATORY_CARE_PROVIDER_SITE_OTHER): Payer: Medicare HMO | Admitting: Internal Medicine

## 2014-11-25 VITALS — BP 130/74 | HR 84 | Ht 66.0 in | Wt 111.6 lb

## 2014-11-25 DIAGNOSIS — F319 Bipolar disorder, unspecified: Secondary | ICD-10-CM | POA: Diagnosis not present

## 2014-11-25 DIAGNOSIS — I10 Essential (primary) hypertension: Secondary | ICD-10-CM

## 2014-11-25 DIAGNOSIS — I2510 Atherosclerotic heart disease of native coronary artery without angina pectoris: Secondary | ICD-10-CM

## 2014-11-25 DIAGNOSIS — I779 Disorder of arteries and arterioles, unspecified: Secondary | ICD-10-CM | POA: Diagnosis not present

## 2014-11-25 DIAGNOSIS — I739 Peripheral vascular disease, unspecified: Secondary | ICD-10-CM

## 2014-11-25 DIAGNOSIS — E785 Hyperlipidemia, unspecified: Secondary | ICD-10-CM

## 2014-11-25 DIAGNOSIS — Z720 Tobacco use: Secondary | ICD-10-CM
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
## 2014-11-25 NOTE — Patient Instructions (Addendum)
Your physician wants you to follow-up in: 1 year with Dr. Debara Pickett. You will receive a reminder letter in the mail two months in advance. If you don't receive a letter, please call our office to schedule the follow-up appointment.  Your physician has requested that you have a carotid duplex. This test is an ultrasound of the carotid arteries in your neck. It looks at blood flow through these arteries that supply the brain with blood. Allow one hour for this exam. There are no restrictions or special instructions.

## 2014-12-07 ENCOUNTER — Telehealth: Payer: Self-pay | Admitting: Pulmonary Disease

## 2014-12-07 MED ORDER — ALBUTEROL SULFATE HFA 108 (90 BASE) MCG/ACT IN AERS: 2.0000 | INHALATION_SPRAY | RESPIRATORY_TRACT | Status: DC | PRN

## 2014-12-07 NOTE — Telephone Encounter (Signed)
Rx refill for Proair sent to pharmacy.

## 2014-12-09 ENCOUNTER — Ambulatory Visit (HOSPITAL_COMMUNITY)
Admission: RE | Admit: 2014-12-09 | Discharge: 2014-12-09 | Disposition: A | Discharge status: Home / Self Care | Payer: Medicare HMO | Source: Ambulatory Visit | Attending: Cardiology | Admitting: Cardiology

## 2014-12-09 DIAGNOSIS — I6523 Occlusion and stenosis of bilateral carotid arteries: Secondary | ICD-10-CM | POA: Insufficient documentation

## 2014-12-09 DIAGNOSIS — I779 Disorder of arteries and arterioles, unspecified: Secondary | ICD-10-CM

## 2014-12-09 DIAGNOSIS — I739 Peripheral vascular disease, unspecified: Secondary | ICD-10-CM

## 2014-12-16 ENCOUNTER — Other Ambulatory Visit: Payer: Self-pay | Admitting: *Deleted

## 2014-12-16 DIAGNOSIS — I739 Peripheral vascular disease, unspecified: Principal | ICD-10-CM

## 2014-12-16 DIAGNOSIS — I6523 Occlusion and stenosis of bilateral carotid arteries: Secondary | ICD-10-CM

## 2014-12-16 DIAGNOSIS — I779 Disorder of arteries and arterioles, unspecified: Secondary | ICD-10-CM

## 2015-03-15 ENCOUNTER — Emergency Department (HOSPITAL_COMMUNITY)
Admission: EM | Admit: 2015-03-15 | Discharge: 2015-03-15 | Disposition: A | Discharge status: Home / Self Care | Payer: Medicare HMO | Attending: Emergency Medicine | Admitting: Emergency Medicine

## 2015-03-15 ENCOUNTER — Emergency Department (HOSPITAL_COMMUNITY): Payer: Medicare HMO

## 2015-03-15 ENCOUNTER — Encounter (HOSPITAL_COMMUNITY): Payer: Self-pay | Admitting: Emergency Medicine

## 2015-03-15 DIAGNOSIS — S51811A Laceration without foreign body of right forearm, initial encounter: Secondary | ICD-10-CM | POA: Diagnosis not present

## 2015-03-15 DIAGNOSIS — Y998 Other external cause status: Secondary | ICD-10-CM | POA: Insufficient documentation

## 2015-03-15 DIAGNOSIS — M1990 Unspecified osteoarthritis, unspecified site: Secondary | ICD-10-CM | POA: Insufficient documentation

## 2015-03-15 DIAGNOSIS — Z720 Tobacco use: Secondary | ICD-10-CM | POA: Diagnosis not present

## 2015-03-15 DIAGNOSIS — Z9981 Dependence on supplemental oxygen: Secondary | ICD-10-CM | POA: Insufficient documentation

## 2015-03-15 DIAGNOSIS — F319 Bipolar disorder, unspecified: Secondary | ICD-10-CM | POA: Diagnosis not present

## 2015-03-15 DIAGNOSIS — I2510 Atherosclerotic heart disease of native coronary artery without angina pectoris: Secondary | ICD-10-CM | POA: Diagnosis not present

## 2015-03-15 DIAGNOSIS — W19XXXA Unspecified fall, initial encounter: Secondary | ICD-10-CM

## 2015-03-15 DIAGNOSIS — Y9289 Other specified places as the place of occurrence of the external cause: Secondary | ICD-10-CM | POA: Diagnosis not present

## 2015-03-15 DIAGNOSIS — Z79899 Other long term (current) drug therapy: Secondary | ICD-10-CM | POA: Insufficient documentation

## 2015-03-15 DIAGNOSIS — Z7982 Long term (current) use of aspirin: Secondary | ICD-10-CM | POA: Diagnosis not present

## 2015-03-15 DIAGNOSIS — Z8669 Personal history of other diseases of the nervous system and sense organs: Secondary | ICD-10-CM | POA: Diagnosis not present

## 2015-03-15 DIAGNOSIS — J449 Chronic obstructive pulmonary disease, unspecified: Secondary | ICD-10-CM | POA: Insufficient documentation

## 2015-03-15 DIAGNOSIS — I10 Essential (primary) hypertension: Secondary | ICD-10-CM | POA: Insufficient documentation

## 2015-03-15 DIAGNOSIS — S5011XA Contusion of right forearm, initial encounter: Secondary | ICD-10-CM | POA: Insufficient documentation

## 2015-03-15 DIAGNOSIS — Z9861 Coronary angioplasty status: Secondary | ICD-10-CM | POA: Diagnosis not present

## 2015-03-15 DIAGNOSIS — W01198A Fall on same level from slipping, tripping and stumbling with subsequent striking against other object, initial encounter: Secondary | ICD-10-CM | POA: Diagnosis not present

## 2015-03-15 DIAGNOSIS — E559 Vitamin D deficiency, unspecified: Secondary | ICD-10-CM | POA: Diagnosis not present

## 2015-03-15 DIAGNOSIS — F209 Schizophrenia, unspecified: Secondary | ICD-10-CM | POA: Diagnosis not present

## 2015-03-15 DIAGNOSIS — E785 Hyperlipidemia, unspecified: Secondary | ICD-10-CM | POA: Insufficient documentation

## 2015-03-15 DIAGNOSIS — T148XXA Other injury of unspecified body region, initial encounter: Secondary | ICD-10-CM

## 2015-03-15 DIAGNOSIS — Y9389 Activity, other specified: Secondary | ICD-10-CM | POA: Diagnosis not present

## 2015-03-15 DIAGNOSIS — S59911A Unspecified injury of right forearm, initial encounter: Secondary | ICD-10-CM | POA: Diagnosis present

## 2015-03-15 MED ORDER — HYDROCODONE-ACETAMINOPHEN 5-325 MG PO TABS
1.0000 | ORAL_TABLET | Freq: Once | ORAL | Status: AC
  Administered 2015-03-15: 1 via ORAL
  Filled 2015-03-15: qty 1

## 2015-03-15 NOTE — Discharge Instructions (Signed)
Fall Prevention and Home Safety Falls cause injuries and can affect all age groups. It is possible to use preventive measures to significantly decrease the likelihood of falls. There are many simple measures which can make your home safer and prevent falls. OUTDOORS  Repair cracks and edges of walkways and driveways.  Remove high doorway thresholds.  Trim shrubbery on the main path into your home.  Have good outside lighting.  Clear walkways of tools, rocks, debris, and clutter.  Check that handrails are not broken and are securely fastened. Both sides of steps should have handrails.  Have leaves, snow, and ice cleared regularly.  Use sand or salt on walkways during winter months.  In the garage, clean up grease or oil spills. BATHROOM  Install night lights.  Install grab bars by the toilet and in the tub and shower.  Use non-skid mats or decals in the tub or shower.  Place a plastic non-slip stool in the shower to sit on, if needed.  Keep floors dry and clean up all water on the floor immediately.  Remove soap buildup in the tub or shower on a regular basis.  Secure bath mats with non-slip, double-sided rug tape.  Remove throw rugs and tripping hazards from the floors. BEDROOMS  Install night lights.  Make sure a bedside light is easy to reach.  Do not use oversized bedding.  Keep a telephone by your bedside.  Have a firm chair with side arms to use for getting dressed.  Remove throw rugs and tripping hazards from the floor. KITCHEN  Keep handles on pots and pans turned toward the center of the stove. Use back burners when possible.  Clean up spills quickly and allow time for drying.  Avoid walking on wet floors.  Avoid hot utensils and knives.  Position shelves so they are not too high or low.  Place commonly used objects within easy reach.  If necessary, use a sturdy step stool with a grab bar when reaching.  Keep electrical cables out of the  way.  Do not use floor polish or wax that makes floors slippery. If you must use wax, use non-skid floor wax.  Remove throw rugs and tripping hazards from the floor. STAIRWAYS  Never leave objects on stairs.  Place handrails on both sides of stairways and use them. Fix any loose handrails. Make sure handrails on both sides of the stairways are as long as the stairs.  Check carpeting to make sure it is firmly attached along stairs. Make repairs to worn or loose carpet promptly.  Avoid placing throw rugs at the top or bottom of stairways, or properly secure the rug with carpet tape to prevent slippage. Get rid of throw rugs, if possible.  Have an electrician put in a light switch at the top and bottom of the stairs. OTHER FALL PREVENTION TIPS  Wear low-heel or rubber-soled shoes that are supportive and fit well. Wear closed toe shoes.  When using a stepladder, make sure it is fully opened and both spreaders are firmly locked. Do not climb a closed stepladder.  Add color or contrast paint or tape to grab bars and handrails in your home. Place contrasting color strips on first and last steps.  Learn and use mobility aids as needed. Install an electrical emergency response system.  Turn on lights to avoid dark areas. Replace light bulbs that burn out immediately. Get light switches that glow.  Arrange furniture to create clear pathways. Keep furniture in the same place.  Firmly attach carpet with non-skid or double-sided tape.  Eliminate uneven floor surfaces.  Select a carpet pattern that does not visually hide the edge of steps.  Be aware of all pets. OTHER HOME SAFETY TIPS  Set the water temperature for 120 F (48.8 C).  Keep emergency numbers on or near the telephone.  Keep smoke detectors on every level of the home and near sleeping areas. Document Released: 06/22/2002 Document Revised: 01/01/2012 Document Reviewed: 09/21/2011 [facility] Patient Information 2015  [place], [place]. This information is not intended to replace advice given to you by your health care provider. Make sure you discuss any questions you have with your health care provider.  Skin Tear Care A skin tear is a wound in which the top layer of skin has peeled off. This is a common problem with aging because the skin becomes thinner and more fragile as a person gets older. In addition, some medicines, such as oral corticosteroids, can lead to skin thinning if taken for long periods of time.  A skin tear is often repaired with tape or skin adhesive strips. This keeps the skin that has been peeled off in contact with the healthier skin beneath. Depending on the location of the wound, a bandage (dressing) may be applied over the tape or skin adhesive strips. Sometimes, during the healing process, the skin turns black and dies. Even when this happens, the torn skin acts as a good dressing until the skin underneath gets healthier and repairs itself. HOME CARE INSTRUCTIONS   Change dressings once per day or as directed by your caregiver.  Gently clean the skin tear and the area around the tear using saline solution or mild soap and water.  Do not rub the injured skin dry. Let the area air dry.  Apply petroleum jelly or an antibiotic cream or ointment to keep the tear moist. This will help the wound heal. Do not allow a scab to form.  If the dressing sticks before the next dressing change, moisten it with warm soapy water and gently remove it.  Protect the injured skin until it has healed.  Only take over-the-counter or prescription medicines as directed by your caregiver.  Take showers or baths using warm soapy water. Apply a new dressing after the shower or bath.  Keep all follow-up appointments as directed by your caregiver.  SEEK IMMEDIATE MEDICAL CARE IF:   You have redness, swelling, or increasing pain in the skin tear.  You havepus coming from the skin tear.  You have  chills.  You have a red streak that goes away from the skin tear.  You have a bad smell coming from the tear or dressing.  You have a fever or persistent symptoms for more than 2-3 days.  You have a fever and your symptoms suddenly get worse. MAKE SURE YOU:  Understand these instructions.  Will watch this condition.  Will get help right away if your child is not doing well or gets worse. Document Released: 03/27/2001 Document Revised: 03/26/2012 Document Reviewed: 01/14/2012 [facility] Patient Information 2015 [place], [place]. This information is not intended to replace advice given to you by your health care provider. Make sure you discuss any questions you have with your health care provider. Tissue Adhesive Wound Care Some cuts, wounds, lacerations, and incisions can be repaired by using tissue adhesive. Tissue adhesive is like glue. It holds the skin together, allowing for faster healing. It forms a strong bond on the skin in about 1 minute and reaches  its full strength in about 2 or 3 minutes. The adhesive disappears naturally while the wound is healing. It is important to take proper care of your wound at home while it heals.  HOME CARE INSTRUCTIONS   Showers are allowed. Do not soak the area containing the tissue adhesive. Do not take baths, swim, or use hot tubs. Do not use any soaps or ointments on the wound. Certain ointments can weaken the glue.  If a bandage (dressing) has been applied, follow your health care provider's instructions for how often to change the dressing.   Keep the dressing dry if one has been applied.   Do not scratch, pick, or rub the adhesive.   Do not place tape over the adhesive. The adhesive could come off when pulling the tape off.   Protect the wound from further injury until it is healed.   Protect the wound from sun and tanning bed exposure while it is healing and for several weeks after healing.   Only take over-the-counter or  prescription medicines as directed by your health care provider.   Keep all follow-up appointments as directed by your health care provider. SEEK IMMEDIATE MEDICAL CARE IF:   Your wound becomes red, swollen, hot, or tender.   You develop a rash after the glue is applied.  You have increasing pain in the wound.   You have a red streak that goes away from the wound.   You have pus coming from the wound.   You have increased bleeding.  You have a fever.  You have shaking chills.   You notice a bad smell coming from the wound.   Your wound or adhesive breaks open.  MAKE SURE YOU:   Understand these instructions.  Will watch your condition.  Will get help right away if you are not doing well or get worse. Document Released: 12/26/2000 Document Revised: 04/22/2013 Document Reviewed: 01/21/2013 [facility] Patient Information 2015 [place], [place]. This information is not intended to replace advice given to you by your health care provider. Make sure you discuss any questions you have with your health care provider.
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
## 2015-03-15 NOTE — ED Notes (Signed)
Dermabond given to PA.

## 2015-03-15 NOTE — ED Notes (Signed)
Still waiting for xray.
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
## 2015-03-15 NOTE — ED Notes (Signed)
Pt c/o right arm pain with hematoma and laceration after trip and fall today; CMS intact and pt denies pain

## 2015-03-15 NOTE — ED Notes (Signed)
Waiting for xray.

## 2015-03-15 NOTE — ED Notes (Signed)
Taken to xray by ED staff per W/C.

## 2015-04-25 ENCOUNTER — Other Ambulatory Visit: Payer: Self-pay

## 2015-04-25 MED ORDER — ALBUTEROL SULFATE HFA 108 (90 BASE) MCG/ACT IN AERS: 2.0000 | INHALATION_SPRAY | RESPIRATORY_TRACT | Status: DC | PRN

## 2015-05-11 ENCOUNTER — Telehealth: Payer: Self-pay | Admitting: Pulmonary Disease

## 2015-05-11 NOTE — Telephone Encounter (Signed)
Called and spoke with pt Pt stated that she needs to schedule an OV with Dr Lake Bells but she wanted to see if she needed to do labs before appt Pt would like to complete labs before appt Informed pt that i looked back at previous ov note and there was no mention of labs at return appt  Pt stated that she remembers BQ telling her about labs she needed  Dr Lake Bells, please advise if pt needs labs before her next OV with you. Thanks

## 2015-05-12 NOTE — Telephone Encounter (Signed)
I don't need her to have labs

## 2015-05-12 NOTE — Telephone Encounter (Signed)
Called and spoke to pt. Informed her she does not need any labs prior to appt with BQ. Appt made with BQ on 11.1.2016. Pt verbalized understanding and denied any further questions or concerns at this time.

## 2015-05-17 ENCOUNTER — Encounter: Payer: Self-pay | Admitting: Pulmonary Disease

## 2015-05-17 ENCOUNTER — Ambulatory Visit (INDEPENDENT_AMBULATORY_CARE_PROVIDER_SITE_OTHER): Payer: Medicare HMO | Admitting: Pulmonary Disease

## 2015-05-17 ENCOUNTER — Telehealth: Payer: Self-pay | Admitting: *Deleted

## 2015-05-17 VITALS — BP 128/76 | HR 101 | Ht 66.0 in | Wt 108.6 lb

## 2015-05-17 DIAGNOSIS — Z23 Encounter for immunization: Secondary | ICD-10-CM | POA: Diagnosis not present

## 2015-05-17 DIAGNOSIS — Z720 Tobacco use: Secondary | ICD-10-CM

## 2015-05-17 DIAGNOSIS — J449 Chronic obstructive pulmonary disease, unspecified: Secondary | ICD-10-CM

## 2015-05-17 DIAGNOSIS — J9611 Chronic respiratory failure with hypoxia: Secondary | ICD-10-CM

## 2015-05-17 NOTE — Assessment & Plan Note (Signed)
We will perform a qualifying walk today because she recently changed DME companies. Otherwise she is to continue 2 L at rest and 3 L with exertion.

## 2015-05-17 NOTE — Progress Notes (Signed)
Subjective:    Patient ID: Caroline Hill, female    DOB: 1945-11-07, 69 y.o.   MRN: [id number]  Synopsis: GOLD Grade D COPD,hospitalized 2015, quit smoking 2015 but started again in January 2016  HPI Chief Complaint  Patient presents with  . Follow-up    Pt states worsening SOB since last OV that requires her to use her O2 more frequently. Pt also c/o productive cough with clear mucus, wheezing, chest congestion.    "I'm fine" She says "I'm out of the breathing stuff", specifically the albuterol and the brovana. She has been out for about a week. No doctor's visit since the the last visit. She is smoking 5 cigarettes a day still.  She is using her oxygen more frequently lately due to dyspnea.  She has ankle swelling which has improved.   Past Medical History  Diagnosis Date  . Hypertension   . Bipolar disorder, unspecified ([place])   . Unspecified constipation   . Other malaise and fatigue   . Unspecified vitamin D deficiency   . Tobacco use disorder   . Hyperlipidemia   . COPD (chronic obstructive pulmonary disease) ([place])   . Shortness of breath     with exertion   . Neuromuscular disorder ([place])   . Depression   . CAD (coronary artery disease)   . On home oxygen therapy     "3L at night and during day prn" (12/01/2013)  . Arthritis     "hands, feet" (12/01/2013)  . Schizophrenia ([place])       Review of Systems  Constitutional: Positive for fatigue. Negative for fever and chills.  HENT: Negative for postnasal drip, rhinorrhea and sinus pressure.   Respiratory: Positive for shortness of breath. Negative for cough and wheezing.   Cardiovascular: Negative for chest pain, palpitations and leg swelling.       Objective:   Physical Exam Filed Vitals:   05/17/15 1647  BP: 128/76  Pulse: 101  Height: '5\' 6"'$  (1.676 m)  Weight: 108 lb 9.6 oz (49.261 kg)  SpO2: 94%  2L   Gen: chronically ill appearing, no acute distress HEENT: EOMi, OP clear PULM: Good air movement but  wheezes bilaterally CV: RRR, slight systolic murmur, no JVD AB: BS+, soft, nontender,  Ext: warm, no edema, no clubbing, no cyanosis Derm: no rash or skin breakdown Neuro: A&Ox4, MAEW       Assessment & Plan:   COPD, severe (HCC) She has severe COPD and her symptoms have progressed over the last several months. This is due to the fact that she continues to smoke cigarettes and she has not been taking her medications. Specifically, she is not taking the Brovana right now. She is wheezing somewhat on exam today but she does not seem to have an exacerbation.  Complicating matters is the fact that she is profoundly deconditioned.  Plan: Flu shot today Restart Brovana Counseled to quit smoking at length Follow-up in 2-3 months rather than 6 months to see if her breathing has improved after quitting smoking and restarting medications  Chronic respiratory failure with hypoxia ([place]) We will perform a qualifying walk today because she recently changed DME companies. Otherwise she is to continue 2 L at rest and 3 L with exertion.  Tobacco abuse Unfortunately she continues to smoke. She was counseled at length today to quit smoking, but she says she doesn't think she can stop altogether. I advised her that ongoing tobacco use will contribute to a more rapid decline in her  pulmonary function.  I also think it would be reasonable for her to undergo low-dose CT CT scan of the lungs as part of a lung cancer screening program. I will ask our lung cancer screening nurse practitioner to contact her.    Updated Medication List Outpatient Encounter Prescriptions as of 05/17/2015  Medication Sig  . albuterol (PROAIR HFA) 108 (90 BASE) MCG/ACT inhaler Inhale 2 puffs into the lungs every 4 (four) hours as needed for wheezing or shortness of breath.  Marland Kitchen amLODipine (NORVASC) 5 MG tablet   . arformoterol (BROVANA) 15 MCG/2ML NEBU Take 2 mLs (15 mcg total) by nebulization 2 (two) times daily.  Marland Kitchen aspirin EC  81 MG tablet Take 81 mg by mouth daily with breakfast.  . budesonide (PULMICORT) 0.5 MG/2ML nebulizer solution Take 2 mLs (0.5 mg total) by nebulization 2 (two) times daily.  . busPIRone (BUSPAR) 10 MG tablet Take 10 mg by mouth 2 (two) times daily.  . Cholecalciferol 5000 UNITS TABS Take 5,000 Units by mouth daily.  . clonazePAM (KLONOPIN) 0.5 MG tablet Take 0.5 tablets (0.25 mg total) by mouth 2 (two) times daily. Take half tablet twice a day and 1 tablet at bedtime   -  For anxiety  . diltiazem (CARDIZEM) 30 MG tablet Take 1 tablet (30 mg total) by mouth 3 (three) times daily.  . feeding supplement, ENSURE COMPLETE, (ENSURE COMPLETE) LIQD Take 237 mLs by mouth 2 (two) times daily between meals.  Marland Kitchen FLUoxetine (PROZAC) 20 MG tablet Take 20 mg by mouth daily with breakfast.   . guaiFENesin (MUCINEX) 600 MG 12 hr tablet Take 2 tablets (1,200 mg total) by mouth 2 (two) times daily.  Marland Kitchen lamoTRIgine (LAMICTAL) 100 MG tablet Take 100 mg by mouth daily with breakfast.   . levalbuterol (XOPENEX) 0.63 MG/3ML nebulizer solution Take 3 mLs (0.63 mg total) by nebulization every 3 (three) hours as needed for wheezing or shortness of breath.  . Multiple Vitamin (MULTIVITAMIN WITH MINERALS) TABS tablet Take 1 tablet by mouth daily.  . OXYGEN Inhale into the lungs continuous.  . pantoprazole (PROTONIX) 40 MG tablet Take 1 tablet (40 mg total) by mouth daily at 12 noon.  . risperiDONE (RISPERDAL) 2 MG tablet Take 2 mg by mouth at bedtime.  . rosuvastatin (CRESTOR) 10 MG tablet Take 10 mg by mouth daily with breakfast.  . SPIRIVA HANDIHALER 18 MCG inhalation capsule Place 18 mcg into inhaler and inhale daily.   . traZODone (DESYREL) 50 MG tablet Take 50 mg by mouth at bedtime.  . vitamin B-12 (CYANOCOBALAMIN) 1000 MCG tablet Take 5,000 mcg by mouth daily with breakfast.   No facility-administered encounter medications on file as of 05/17/2015.

## 2015-05-17 NOTE — Telephone Encounter (Signed)
For oxygen. Thanks

## 2015-05-17 NOTE — Telephone Encounter (Signed)
Called and spoke to [place] with [employer]. Caroline Hill states the pt is switching from [place] to [facility] and after pt's appt they need an order for the change and for pt's O2, demographics, and OV notes.    Trish Fountain said she is faxing over the order form for BQ to sign and the pt will need to be walked today to show pt needs O2. Will forward to [place] for when pt comes to office for appt.

## 2015-05-17 NOTE — Addendum Note (Signed)
Addended by: Len Blalock on: 05/17/2015 05:24 PM   Modules accepted: Orders

## 2015-05-17 NOTE — Telephone Encounter (Signed)
Yvetta Coder with [facility] is needing orders, clinic notes and demographic info on this patient. Fax # [phone]

## 2015-05-17 NOTE — Assessment & Plan Note (Signed)
She has severe COPD and her symptoms have progressed over the last several months. This is due to the fact that she continues to smoke cigarettes and she has not been taking her medications. Specifically, she is not taking the Brovana right now. She is wheezing somewhat on exam today but she does not seem to have an exacerbation.  Complicating matters is the fact that she is profoundly deconditioned.  Plan: Flu shot today Restart Brovana Counseled to quit smoking at length Follow-up in 2-3 months rather than 6 months to see if her breathing has improved after quitting smoking and restarting medications

## 2015-05-17 NOTE — Patient Instructions (Signed)
Stop smoking Take your medications as prescribed including Brovana, Pulmicort, and Spiriva Keep using oxygen as you're doing We will have our lung cancer screening coordinator call you We will see back in 3 months or sooner if needed

## 2015-05-17 NOTE — Telephone Encounter (Signed)
Order has been placed and handled through office visit.  Nothing further needed.

## 2015-05-17 NOTE — Assessment & Plan Note (Signed)
Unfortunately she continues to smoke. She was counseled at length today to quit smoking, but she says she doesn't think she can stop altogether. I advised her that ongoing tobacco use will contribute to a more rapid decline in her pulmonary function.  I also think it would be reasonable for her to undergo low-dose CT CT scan of the lungs as part of a lung cancer screening program. I will ask our lung cancer screening nurse practitioner to contact her.

## 2015-05-25 ENCOUNTER — Other Ambulatory Visit: Payer: Self-pay | Admitting: Acute Care

## 2015-05-25 DIAGNOSIS — F17210 Nicotine dependence, cigarettes, uncomplicated: Principal | ICD-10-CM

## 2015-05-31 ENCOUNTER — Encounter: Payer: Self-pay | Admitting: Acute Care

## 2015-05-31 ENCOUNTER — Encounter: Payer: Medicare HMO | Admitting: Acute Care

## 2015-05-31 ENCOUNTER — Ambulatory Visit (INDEPENDENT_AMBULATORY_CARE_PROVIDER_SITE_OTHER): Payer: Medicare HMO | Admitting: Acute Care

## 2015-05-31 ENCOUNTER — Ambulatory Visit (INDEPENDENT_AMBULATORY_CARE_PROVIDER_SITE_OTHER)
Admission: RE | Admit: 2015-05-31 | Discharge: 2015-05-31 | Disposition: A | Discharge status: Home / Self Care | Payer: Medicare HMO | Source: Ambulatory Visit | Attending: Acute Care | Admitting: Acute Care

## 2015-05-31 DIAGNOSIS — F17210 Nicotine dependence, cigarettes, uncomplicated: Secondary | ICD-10-CM

## 2015-05-31 NOTE — Progress Notes (Signed)
Shared Decision Making Visit Lung Cancer Screening Program [phone])   Eligibility:  Age 69 y.o.  Pack Years Smoking History Calculation: 60 pack years (# packs/per year x # years smoked)  Recent History of coughing up blood  no  Unexplained weight loss? no ( >Than 15 pounds within the last 6 months )  Prior History Lung / other cancer no (Diagnosis within the last 5 years already requiring surveillance chest CT Scans).  Smoking Status Current Smoker  Former Smokers: Years since quit: NA  Quit Date: NA  Visit Components:  Discussion included one or more decision making aids. yes  Discussion included risk/benefits of screening. yes  Discussion included potential follow up diagnostic testing for abnormal scans. yes  Discussion included meaning and risk of over diagnosis. yes  Discussion included meaning and risk of False Positives. yes  Discussion included meaning of total radiation exposure. yes  Counseling Included:  Importance of adherence to annual lung cancer LDCT screening. yes  Impact of comorbidities on ability to participate in the program. yes  Ability and willingness to under diagnostic treatment. yes  Smoking Cessation Counseling:  Current Smokers:   Discussed importance of smoking cessation. yes  Information about tobacco cessation classes and interventions provided to patient. yes  Patient provided with "ticket" for LDCT Scan. yes  Symptomatic Patient. no  Counseling:NA  Diagnosis Code: Tobacco Use Z72.0  Asymptomatic Patient yes  Counseling (Intermediate counseling: > three minutes counseling) A2130  Former Smokers:   Discussed the importance of maintaining cigarette abstinence. NA  Diagnosis Code: Personal History of Nicotine Dependence. Q65.784  Information about tobacco cessation classes and interventions provided to patient. Yes, see above  Patient provided with "ticket" for LDCT Scan. yes  Written Order for Lung Cancer  Screening with LDCT placed in Epic. Yes (CT Chest Lung Cancer Screening Low Dose W/O CM) [id number] Z12.2-Screening of respiratory organs Z87.891-Personal history of nicotine dependence  I spent 15 minutes of face to face time with Ms. Schachter discussing the risks and benefits of lung cancer screening. We viewed a power point together that highlighted the above noted topics, pausing at intervals to allow for questions to be asked and answered to ensure understanding.We discussed that the single most powerful action that she can take to decrease her risk of developing lung cancer is to quit smoking. She states that she has tried in the past but has been unsuccessful. We discussed nicotine replacement therapy, non - nicotine medication, and support groups. We also discussed the Quit Smart classes. I gave her a " Be stronger than your excuses" cards with resources and their contact information to assist in helping her to quit smoking when she is ready to try again. I have given her my card and contact information and asked her to call me when she is ready to set a quit date, so that I can help her, making sure she has the tools necessary to be successful in liberating herself from cigarettes. We also talked about setting small goals, smoking 1 cigarette less per day, as that often seems more achievable that quitting entirely. We discussed the time and location of the scan, and that I will call her within 24-48 hours with her results. I have given her a copy of the power point we viewed together to refer to in the future and my card and contact information in the event she needs to speak with me.She verbalized understanding of all of the above and had no further questions for me upon  leaving the office.  Magdalen Spatz, NP

## 2015-06-01 ENCOUNTER — Telehealth: Payer: Self-pay | Admitting: Acute Care

## 2015-06-01 NOTE — Telephone Encounter (Signed)
I have called the results of Caroline Hill's LDCT screening scan. I explained that her scan was read as a Lung RADS 2, nodules with a very low likelihood of becoming a clinically active cancer due to size or lack of growth. I explained that the recommendation is for a follow up scan in 12 months, which we will schedule for Nov. 2017. She verbalized understanding of the above and had no further questions upon ending the call. She has my contact information in the event she has any additional questions.

## 2015-08-06 ENCOUNTER — Other Ambulatory Visit: Payer: Self-pay | Admitting: Pulmonary Disease

## 2015-08-22 ENCOUNTER — Ambulatory Visit: Payer: Medicare HMO | Admitting: Pulmonary Disease

## 2015-12-14 ENCOUNTER — Ambulatory Visit: Payer: Medicare HMO | Admitting: Pulmonary Disease

## 2015-12-18 ENCOUNTER — Emergency Department (HOSPITAL_COMMUNITY): Payer: Medicare HMO

## 2015-12-18 ENCOUNTER — Encounter (HOSPITAL_COMMUNITY): Payer: Self-pay

## 2015-12-18 ENCOUNTER — Inpatient Hospital Stay (HOSPITAL_COMMUNITY)
Admission: EM | Admit: 2015-12-18 | Discharge: 2015-12-21 | DRG: 689 | Disposition: A | Discharge status: Home / Self Care | Payer: Medicare HMO | Attending: Internal Medicine | Admitting: Internal Medicine

## 2015-12-18 DIAGNOSIS — F17210 Nicotine dependence, cigarettes, uncomplicated: Secondary | ICD-10-CM | POA: Diagnosis present

## 2015-12-18 DIAGNOSIS — Z955 Presence of coronary angioplasty implant and graft: Secondary | ICD-10-CM

## 2015-12-18 DIAGNOSIS — E785 Hyperlipidemia, unspecified: Secondary | ICD-10-CM | POA: Diagnosis present

## 2015-12-18 DIAGNOSIS — G92 Toxic encephalopathy: Secondary | ICD-10-CM | POA: Diagnosis present

## 2015-12-18 DIAGNOSIS — Z9981 Dependence on supplemental oxygen: Secondary | ICD-10-CM

## 2015-12-18 DIAGNOSIS — I959 Hypotension, unspecified: Secondary | ICD-10-CM | POA: Diagnosis present

## 2015-12-18 DIAGNOSIS — R55 Syncope and collapse: Secondary | ICD-10-CM | POA: Diagnosis not present

## 2015-12-18 DIAGNOSIS — F209 Schizophrenia, unspecified: Secondary | ICD-10-CM | POA: Diagnosis present

## 2015-12-18 DIAGNOSIS — G9340 Encephalopathy, unspecified: Secondary | ICD-10-CM | POA: Diagnosis present

## 2015-12-18 DIAGNOSIS — F329 Major depressive disorder, single episode, unspecified: Secondary | ICD-10-CM | POA: Diagnosis present

## 2015-12-18 DIAGNOSIS — I2510 Atherosclerotic heart disease of native coronary artery without angina pectoris: Secondary | ICD-10-CM | POA: Diagnosis present

## 2015-12-18 DIAGNOSIS — I739 Peripheral vascular disease, unspecified: Secondary | ICD-10-CM | POA: Diagnosis present

## 2015-12-18 DIAGNOSIS — I10 Essential (primary) hypertension: Secondary | ICD-10-CM | POA: Diagnosis present

## 2015-12-18 DIAGNOSIS — Z8249 Family history of ischemic heart disease and other diseases of the circulatory system: Secondary | ICD-10-CM

## 2015-12-18 DIAGNOSIS — J449 Chronic obstructive pulmonary disease, unspecified: Secondary | ICD-10-CM | POA: Diagnosis present

## 2015-12-18 DIAGNOSIS — E559 Vitamin D deficiency, unspecified: Secondary | ICD-10-CM | POA: Diagnosis present

## 2015-12-18 DIAGNOSIS — F419 Anxiety disorder, unspecified: Secondary | ICD-10-CM | POA: Diagnosis present

## 2015-12-18 DIAGNOSIS — Z7982 Long term (current) use of aspirin: Secondary | ICD-10-CM

## 2015-12-18 DIAGNOSIS — Z79899 Other long term (current) drug therapy: Secondary | ICD-10-CM

## 2015-12-18 DIAGNOSIS — J441 Chronic obstructive pulmonary disease with (acute) exacerbation: Secondary | ICD-10-CM

## 2015-12-18 DIAGNOSIS — Z720 Tobacco use: Secondary | ICD-10-CM | POA: Diagnosis present

## 2015-12-18 DIAGNOSIS — I272 Other secondary pulmonary hypertension: Secondary | ICD-10-CM | POA: Diagnosis present

## 2015-12-18 DIAGNOSIS — E872 Acidosis: Secondary | ICD-10-CM | POA: Diagnosis present

## 2015-12-18 DIAGNOSIS — Z90710 Acquired absence of both cervix and uterus: Secondary | ICD-10-CM

## 2015-12-18 DIAGNOSIS — B9620 Unspecified Escherichia coli [E. coli] as the cause of diseases classified elsewhere: Secondary | ICD-10-CM | POA: Diagnosis present

## 2015-12-18 DIAGNOSIS — F319 Bipolar disorder, unspecified: Secondary | ICD-10-CM | POA: Diagnosis present

## 2015-12-18 DIAGNOSIS — N390 Urinary tract infection, site not specified: Principal | ICD-10-CM | POA: Diagnosis present

## 2015-12-18 DIAGNOSIS — M1990 Unspecified osteoarthritis, unspecified site: Secondary | ICD-10-CM | POA: Diagnosis present

## 2015-12-18 DIAGNOSIS — E876 Hypokalemia: Secondary | ICD-10-CM | POA: Diagnosis present

## 2015-12-18 DIAGNOSIS — M5440 Lumbago with sciatica, unspecified side: Secondary | ICD-10-CM | POA: Diagnosis present

## 2015-12-18 LAB — I-STAT ARTERIAL BLOOD GAS, ED
ACID-BASE EXCESS: 9 mmol/L — AB (ref 0.0–2.0)
BICARBONATE: 37.9 meq/L — AB (ref 20.0–24.0)
O2 SAT: 95 %
PO2 ART: 85 mmHg (ref 80.0–100.0)
Patient temperature: 98.6
TCO2: 40 mmol/L (ref 0–100)
pCO2 arterial: 71.7 mmHg (ref 35.0–45.0)
pH, Arterial: 7.332 — ABNORMAL LOW (ref 7.350–7.450)

## 2015-12-18 LAB — BASIC METABOLIC PANEL
Anion gap: 7 (ref 5–15)
BUN: 19 mg/dL (ref 6–20)
CALCIUM: 9.5 mg/dL (ref 8.9–10.3)
CO2: 37 mmol/L — AB (ref 22–32)
CREATININE: 1.01 mg/dL — AB (ref 0.44–1.00)
Chloride: 96 mmol/L — ABNORMAL LOW (ref 101–111)
GFR calc non Af Amer: 55 mL/min — ABNORMAL LOW (ref >60)
GLUCOSE: 112 mg/dL — AB (ref 65–99)
Potassium: 3.5 mmol/L (ref 3.5–5.1)
Sodium: 140 mmol/L (ref 135–145)

## 2015-12-18 LAB — CBC
HCT: 39.1 % (ref 36.0–46.0)
Hemoglobin: 12.3 g/dL (ref 12.0–15.0)
MCH: 29 pg (ref 26.0–34.0)
MCHC: 31.5 g/dL (ref 30.0–36.0)
MCV: 92.2 fL (ref 78.0–100.0)
Platelets: 242 10*3/uL (ref 150–400)
RBC: 4.24 MIL/uL (ref 3.87–5.11)
RDW: 14.6 % (ref 11.5–15.5)
WBC: 9.9 10*3/uL (ref 4.0–10.5)

## 2015-12-18 LAB — ETHANOL: Alcohol, Ethyl (B): <5 mg/dL (ref <5)

## 2015-12-18 LAB — TROPONIN I

## 2015-12-18 MED ORDER — SODIUM CHLORIDE 0.9 % IV BOLUS (SEPSIS)
500.0000 mL | Freq: Once | INTRAVENOUS | Status: AC
  Administered 2015-12-19: 500 mL via INTRAVENOUS

## 2015-12-18 NOTE — ED Notes (Signed)
Pt to CT scan off of [facility] "long enough for CT scan", per Dr. Jeneen Rinks. RT informed. Pt placed on .

## 2015-12-18 NOTE — ED Notes (Addendum)
Patient complains of increased weakness x 2 days. Patient slow to answer questions and arrived with O2 sats 84%  On room air. Wears oxygen all the time for her COPD. Complains of left leg pain, denies trauma.  Per family increased sleeping, decreased appetite.  Alert to person and place. Spouse states today he found her in floor and thinks she had syncopal event
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
## 2015-12-19 ENCOUNTER — Inpatient Hospital Stay (HOSPITAL_COMMUNITY): Payer: Medicare HMO

## 2015-12-19 ENCOUNTER — Inpatient Hospital Stay (HOSPITAL_COMMUNITY): Admission: RE | Admit: 2015-12-19 | Payer: Medicare HMO | Source: Ambulatory Visit

## 2015-12-19 DIAGNOSIS — I739 Peripheral vascular disease, unspecified: Secondary | ICD-10-CM | POA: Diagnosis present

## 2015-12-19 DIAGNOSIS — Z9981 Dependence on supplemental oxygen: Secondary | ICD-10-CM | POA: Diagnosis not present

## 2015-12-19 DIAGNOSIS — N390 Urinary tract infection, site not specified: Principal | ICD-10-CM | POA: Diagnosis present

## 2015-12-19 DIAGNOSIS — J441 Chronic obstructive pulmonary disease with (acute) exacerbation: Secondary | ICD-10-CM | POA: Diagnosis present

## 2015-12-19 DIAGNOSIS — J9622 Acute and chronic respiratory failure with hypercapnia: Secondary | ICD-10-CM

## 2015-12-19 DIAGNOSIS — M1990 Unspecified osteoarthritis, unspecified site: Secondary | ICD-10-CM | POA: Diagnosis present

## 2015-12-19 DIAGNOSIS — I10 Essential (primary) hypertension: Secondary | ICD-10-CM

## 2015-12-19 DIAGNOSIS — Z79899 Other long term (current) drug therapy: Secondary | ICD-10-CM | POA: Diagnosis not present

## 2015-12-19 DIAGNOSIS — J449 Chronic obstructive pulmonary disease, unspecified: Secondary | ICD-10-CM | POA: Diagnosis not present

## 2015-12-19 DIAGNOSIS — G9340 Encephalopathy, unspecified: Secondary | ICD-10-CM | POA: Diagnosis not present

## 2015-12-19 DIAGNOSIS — I959 Hypotension, unspecified: Secondary | ICD-10-CM | POA: Diagnosis present

## 2015-12-19 DIAGNOSIS — G92 Toxic encephalopathy: Secondary | ICD-10-CM | POA: Diagnosis present

## 2015-12-19 DIAGNOSIS — E872 Acidosis: Secondary | ICD-10-CM | POA: Diagnosis present

## 2015-12-19 DIAGNOSIS — J438 Other emphysema: Secondary | ICD-10-CM | POA: Diagnosis not present

## 2015-12-19 DIAGNOSIS — F319 Bipolar disorder, unspecified: Secondary | ICD-10-CM | POA: Diagnosis present

## 2015-12-19 DIAGNOSIS — R55 Syncope and collapse: Secondary | ICD-10-CM | POA: Diagnosis present

## 2015-12-19 DIAGNOSIS — I2510 Atherosclerotic heart disease of native coronary artery without angina pectoris: Secondary | ICD-10-CM | POA: Diagnosis present

## 2015-12-19 DIAGNOSIS — Z720 Tobacco use: Secondary | ICD-10-CM

## 2015-12-19 DIAGNOSIS — Z8249 Family history of ischemic heart disease and other diseases of the circulatory system: Secondary | ICD-10-CM | POA: Diagnosis not present

## 2015-12-19 DIAGNOSIS — F209 Schizophrenia, unspecified: Secondary | ICD-10-CM | POA: Diagnosis present

## 2015-12-19 DIAGNOSIS — E876 Hypokalemia: Secondary | ICD-10-CM | POA: Diagnosis present

## 2015-12-19 DIAGNOSIS — I272 Other secondary pulmonary hypertension: Secondary | ICD-10-CM | POA: Diagnosis present

## 2015-12-19 DIAGNOSIS — E559 Vitamin D deficiency, unspecified: Secondary | ICD-10-CM | POA: Diagnosis present

## 2015-12-19 DIAGNOSIS — F17210 Nicotine dependence, cigarettes, uncomplicated: Secondary | ICD-10-CM | POA: Diagnosis present

## 2015-12-19 DIAGNOSIS — B9620 Unspecified Escherichia coli [E. coli] as the cause of diseases classified elsewhere: Secondary | ICD-10-CM | POA: Diagnosis present

## 2015-12-19 DIAGNOSIS — Z955 Presence of coronary angioplasty implant and graft: Secondary | ICD-10-CM | POA: Diagnosis not present

## 2015-12-19 DIAGNOSIS — Z90710 Acquired absence of both cervix and uterus: Secondary | ICD-10-CM | POA: Diagnosis not present

## 2015-12-19 DIAGNOSIS — M5440 Lumbago with sciatica, unspecified side: Secondary | ICD-10-CM | POA: Diagnosis present

## 2015-12-19 DIAGNOSIS — F419 Anxiety disorder, unspecified: Secondary | ICD-10-CM | POA: Diagnosis present

## 2015-12-19 DIAGNOSIS — E785 Hyperlipidemia, unspecified: Secondary | ICD-10-CM | POA: Diagnosis present

## 2015-12-19 DIAGNOSIS — Z7982 Long term (current) use of aspirin: Secondary | ICD-10-CM | POA: Diagnosis not present

## 2015-12-19 DIAGNOSIS — F418 Other specified anxiety disorders: Secondary | ICD-10-CM | POA: Diagnosis not present

## 2015-12-19 LAB — BASIC METABOLIC PANEL
ANION GAP: 6 (ref 5–15)
BUN: 20 mg/dL (ref 6–20)
CO2: 35 mmol/L — ABNORMAL HIGH (ref 22–32)
Calcium: 8.9 mg/dL (ref 8.9–10.3)
Chloride: 99 mmol/L — ABNORMAL LOW (ref 101–111)
Creatinine, Ser: 0.68 mg/dL (ref 0.44–1.00)
GFR calc Af Amer: >60 mL/min (ref >60)
Glucose, Bld: 88 mg/dL (ref 65–99)
POTASSIUM: 3.4 mmol/L — AB (ref 3.5–5.1)
Sodium: 140 mmol/L (ref 135–145)

## 2015-12-19 LAB — MRSA PCR SCREENING: MRSA BY PCR: NEGATIVE

## 2015-12-19 LAB — CBC
HEMATOCRIT: 35.5 % — AB (ref 36.0–46.0)
Hemoglobin: 11 g/dL — ABNORMAL LOW (ref 12.0–15.0)
MCH: 28.7 pg (ref 26.0–34.0)
MCHC: 31 g/dL (ref 30.0–36.0)
MCV: 92.7 fL (ref 78.0–100.0)
PLATELETS: 245 10*3/uL (ref 150–400)
RBC: 3.83 MIL/uL — ABNORMAL LOW (ref 3.87–5.11)
RDW: 14.6 % (ref 11.5–15.5)
WBC: 8.6 10*3/uL (ref 4.0–10.5)

## 2015-12-19 LAB — I-STAT ARTERIAL BLOOD GAS, ED
Acid-Base Excess: 8 mmol/L — ABNORMAL HIGH (ref 0.0–2.0)
Bicarbonate: 36.5 mEq/L — ABNORMAL HIGH (ref 20.0–24.0)
O2 SAT: 92 %
TCO2: 39 mmol/L (ref 0–100)
pCO2 arterial: 68.8 mmHg (ref 35.0–45.0)
pH, Arterial: 7.332 — ABNORMAL LOW (ref 7.350–7.450)
pO2, Arterial: 71 mmHg — ABNORMAL LOW (ref 80.0–100.0)

## 2015-12-19 LAB — RAPID URINE DRUG SCREEN, HOSP PERFORMED
Amphetamines: NOT DETECTED
BARBITURATES: NOT DETECTED
BENZODIAZEPINES: NOT DETECTED
COCAINE: NOT DETECTED
Opiates: NOT DETECTED
Tetrahydrocannabinol: NOT DETECTED

## 2015-12-19 LAB — HEPATIC FUNCTION PANEL
ALK PHOS: 80 U/L (ref 38–126)
ALT: 16 U/L (ref 14–54)
AST: 19 U/L (ref 15–41)
Albumin: 2.9 g/dL — ABNORMAL LOW (ref 3.5–5.0)
BILIRUBIN INDIRECT: 0.3 mg/dL (ref 0.3–0.9)
BILIRUBIN TOTAL: 0.5 mg/dL (ref 0.3–1.2)
Bilirubin, Direct: 0.2 mg/dL (ref 0.1–0.5)
TOTAL PROTEIN: 6.3 g/dL — AB (ref 6.5–8.1)

## 2015-12-19 LAB — URINALYSIS, ROUTINE W REFLEX MICROSCOPIC
Glucose, UA: NEGATIVE mg/dL
KETONES UR: 15 mg/dL — AB
NITRITE: NEGATIVE
PH: 5.5 (ref 5.0–8.0)
PROTEIN: 100 mg/dL — AB
Specific Gravity, Urine: 1.021 (ref 1.005–1.030)

## 2015-12-19 LAB — AMMONIA: Ammonia: 37 umol/L — ABNORMAL HIGH (ref 9–35)

## 2015-12-19 LAB — SALICYLATE LEVEL

## 2015-12-19 LAB — URINE MICROSCOPIC-ADD ON

## 2015-12-19 LAB — MAGNESIUM: Magnesium: 1.8 mg/dL (ref 1.7–2.4)

## 2015-12-19 LAB — T4, FREE: Free T4: 0.98 ng/dL (ref 0.61–1.12)

## 2015-12-19 LAB — TSH: TSH: 1.227 u[IU]/mL (ref 0.350–4.500)

## 2015-12-19 LAB — I-STAT CG4 LACTIC ACID, ED: Lactic Acid, Venous: 0.73 mmol/L (ref 0.5–2.0)

## 2015-12-19 LAB — ACETAMINOPHEN LEVEL

## 2015-12-19 MED ORDER — BUDESONIDE 0.25 MG/2ML IN SUSP
0.2500 mg | Freq: Two times a day (BID) | RESPIRATORY_TRACT | Status: DC
  Administered 2015-12-19 – 2015-12-21 (×4): 0.25 mg via RESPIRATORY_TRACT
  Filled 2015-12-19 (×4): qty 2

## 2015-12-19 MED ORDER — SODIUM CHLORIDE 0.9 % IV SOLN
INTRAVENOUS | Status: DC
  Administered 2015-12-19: 04:00:00 via INTRAVENOUS

## 2015-12-19 MED ORDER — GADOBENATE DIMEGLUMINE 529 MG/ML IV SOLN
10.0000 mL | Freq: Once | INTRAVENOUS | Status: AC | PRN
  Administered 2015-12-19: 10 mL via INTRAVENOUS

## 2015-12-19 MED ORDER — FAMOTIDINE IN NACL 20-0.9 MG/50ML-% IV SOLN
20.0000 mg | Freq: Two times a day (BID) | INTRAVENOUS | Status: DC
  Administered 2015-12-19 – 2015-12-20 (×3): 20 mg via INTRAVENOUS
  Filled 2015-12-19 (×3): qty 50

## 2015-12-19 MED ORDER — TRAMADOL HCL 50 MG PO TABS
50.0000 mg | ORAL_TABLET | Freq: Four times a day (QID) | ORAL | Status: DC | PRN
  Administered 2015-12-19 – 2015-12-20 (×3): 50 mg via ORAL
  Filled 2015-12-19 (×4): qty 1

## 2015-12-19 MED ORDER — OXYCODONE HCL 5 MG PO TABS
5.0000 mg | ORAL_TABLET | ORAL | Status: DC | PRN
  Administered 2015-12-20 (×2): 10 mg via ORAL
  Filled 2015-12-19 (×2): qty 2

## 2015-12-19 MED ORDER — RISPERIDONE 2 MG PO TABS
2.0000 mg | ORAL_TABLET | Freq: Every day | ORAL | Status: DC
  Administered 2015-12-19 – 2015-12-20 (×2): 2 mg via ORAL
  Filled 2015-12-19: qty 1
  Filled 2015-12-19: qty 4
  Filled 2015-12-19: qty 1

## 2015-12-19 MED ORDER — AMLODIPINE BESYLATE 10 MG PO TABS
10.0000 mg | ORAL_TABLET | Freq: Every day | ORAL | Status: DC
  Administered 2015-12-19 – 2015-12-21 (×3): 10 mg via ORAL
  Filled 2015-12-19 (×3): qty 1

## 2015-12-19 MED ORDER — SODIUM CHLORIDE 0.9% FLUSH
3.0000 mL | Freq: Two times a day (BID) | INTRAVENOUS | Status: DC
  Administered 2015-12-19: 3 mL via INTRAVENOUS

## 2015-12-19 MED ORDER — ACETAMINOPHEN 325 MG PO TABS: 650.0000 mg | ORAL_TABLET | Freq: Four times a day (QID) | ORAL | Status: DC | PRN

## 2015-12-19 MED ORDER — IPRATROPIUM-ALBUTEROL 0.5-2.5 (3) MG/3ML IN SOLN
3.0000 mL | Freq: Four times a day (QID) | RESPIRATORY_TRACT | Status: DC
  Administered 2015-12-19: 3 mL via RESPIRATORY_TRACT
  Filled 2015-12-19: qty 3

## 2015-12-19 MED ORDER — ONDANSETRON HCL 4 MG PO TABS: 4.0000 mg | ORAL_TABLET | Freq: Four times a day (QID) | ORAL | Status: DC | PRN

## 2015-12-19 MED ORDER — BENZTROPINE MESYLATE 1 MG PO TABS
0.5000 mg | ORAL_TABLET | Freq: Every day | ORAL | Status: DC
  Administered 2015-12-19 – 2015-12-20 (×2): 0.5 mg via ORAL
  Filled 2015-12-19 (×2): qty 1

## 2015-12-19 MED ORDER — ONDANSETRON HCL 4 MG/2ML IJ SOLN: 4.0000 mg | Freq: Four times a day (QID) | INTRAMUSCULAR | Status: DC | PRN

## 2015-12-19 MED ORDER — BUSPIRONE HCL 5 MG PO TABS
10.0000 mg | ORAL_TABLET | Freq: Two times a day (BID) | ORAL | Status: DC
  Administered 2015-12-19 – 2015-12-21 (×4): 10 mg via ORAL
  Filled 2015-12-19 (×4): qty 2

## 2015-12-19 MED ORDER — ADULT MULTIVITAMIN W/MINERALS CH
1.0000 | ORAL_TABLET | ORAL | Status: DC
  Administered 2015-12-20 – 2015-12-21 (×2): 1 via ORAL
  Filled 2015-12-19 (×2): qty 1

## 2015-12-19 MED ORDER — DEXTROSE 5 % IV SOLN
1.0000 g | Freq: Once | INTRAVENOUS | Status: AC
  Administered 2015-12-19: 1 g via INTRAVENOUS
  Filled 2015-12-19: qty 10

## 2015-12-19 MED ORDER — ACETAMINOPHEN 650 MG RE SUPP: 650.0000 mg | Freq: Four times a day (QID) | RECTAL | Status: DC | PRN

## 2015-12-19 MED ORDER — SODIUM CHLORIDE 0.9 % IV SOLN
INTRAVENOUS | Status: DC
  Administered 2015-12-19: 18:00:00 via INTRAVENOUS
  Administered 2015-12-20: 1000 mL via INTRAVENOUS
  Administered 2015-12-21: 06:00:00 via INTRAVENOUS

## 2015-12-19 MED ORDER — IPRATROPIUM-ALBUTEROL 0.5-2.5 (3) MG/3ML IN SOLN
3.0000 mL | RESPIRATORY_TRACT | Status: DC
  Administered 2015-12-19 (×2): 3 mL via RESPIRATORY_TRACT
  Filled 2015-12-19 (×2): qty 3

## 2015-12-19 MED ORDER — IPRATROPIUM-ALBUTEROL 0.5-2.5 (3) MG/3ML IN SOLN
3.0000 mL | Freq: Three times a day (TID) | RESPIRATORY_TRACT | Status: DC
  Administered 2015-12-20 – 2015-12-21 (×4): 3 mL via RESPIRATORY_TRACT
  Filled 2015-12-19 (×4): qty 3

## 2015-12-19 MED ORDER — ASPIRIN EC 81 MG PO TBEC
81.0000 mg | DELAYED_RELEASE_TABLET | Freq: Every day | ORAL | Status: DC
  Administered 2015-12-19 – 2015-12-20 (×2): 81 mg via ORAL
  Filled 2015-12-19 (×2): qty 1

## 2015-12-19 MED ORDER — DEXTROSE 5 % IV SOLN
1.0000 g | INTRAVENOUS | Status: DC
  Administered 2015-12-20 (×2): 1 g via INTRAVENOUS
  Filled 2015-12-19 (×2): qty 10

## 2015-12-19 NOTE — ED Notes (Signed)
Dr. Eulas Post paged with rectal temperature. Rectal temp 98.9

## 2015-12-19 NOTE — ED Notes (Addendum)
This RN was alerted by central monitor that pt's O2 sats were 80-85% with good pleth.  THis RN went in room to assess pt, found pt slumped down with covers over face.  BiPap mask was on and in place, but BiPap machine was dead.  This RN removed mask and placed [place] on pt, which was already set up at at [place].  Pt's O2 sats improved.  Pt's RN Jarrett Soho, RN made aware.

## 2015-12-19 NOTE — ED Notes (Signed)
RT at the bedside.

## 2015-12-19 NOTE — Progress Notes (Signed)
[facility] TEAM 1 - Stepdown/ICU TEAM PROGRESS NOTE  Caroline Hill NTI:[id number] DOB: August 08, 1945 DOA: 12/18/2015 PCP: [name], MD  Admit HPI / Brief Narrative: 70 y.o. F with a history of CAD S/P stent placement, COPD on home oxygen 3L [place] qHS and prn at baseline, HTN, depression, and Bipolar disorder who demonstrated increased lethargy over several days. At times, she had garbled, incomprehensible speech but no facial droop or asymmetric weakness in her extremities. Her husband found her on the floor in their home, too weak to get up, and brought her to the ED.  In the ED ABG showed pH 7.33, pCO2 71, pO2 85, bicarb 40. No fever. No leukocytosis. First troponin negative. No acute EKG changes. Normal lactic acid. Chest xray showed changes consistent with COPD but no acute superimposed findings. Head CT shows moderate chronic small vessel disease. U/A suggested infection. Empiric Rocephin ordered.  HPI/Subjective: Pt seen for f/u visit.  Assessment/Plan:  Acute encephalopathy  UTI  Mild Hypokalemia   COPD   HTN  Anxiety, depression, Bipolar  CAD, remote stents  Code Status: FULL Family Communication: no family present at time of exam Disposition Plan: Stepdown unit  Consultants: None  Procedures: None  Antibiotics: Ceftriaxone 6/4 >  DVT prophylaxis: SCDs  Objective: Blood pressure 162/81, pulse 95, temperature 99.6 F (37.6 C), temperature source Oral, resp. rate 16, SpO2 97 %.  Intake/Output Summary (Last 24 hours) at 12/19/15 1556 Last data filed at 12/19/15 1423  Gross per 24 hour  Intake      0 ml  Output    700 ml  Net   -700 ml     Exam: Pt seen for f/u visit.  Data Reviewed: Basic Metabolic Panel:  Recent Labs Lab 12/18/15 1953 12/19/15 0023 12/19/15 0617  NA 140  --  140  K 3.5  --  3.4*  CL 96*  --  99*  CO2 37*  --  35*  GLUCOSE 112*  --  88  BUN 19  --  20  CREATININE 1.01*  --  0.68  CALCIUM 9.5  --  8.9  MG   --  1.8  --     CBC:  Recent Labs Lab 12/18/15 1953 12/19/15 0617  WBC 9.9 8.6  HGB 12.3 11.0*  HCT 39.1 35.5*  MCV 92.2 92.7  PLT 242 245    Liver Function Tests:  Recent Labs Lab 12/19/15 0023  AST 19  ALT 16  ALKPHOS 80  BILITOT 0.5  PROT 6.3*  ALBUMIN 2.9*    Recent Labs Lab 12/19/15 0023  AMMONIA 37*   Cardiac Enzymes:  Recent Labs Lab 12/18/15 2145  TROPONINI <0.03    Recent Results (from the past 240 hour(s))  MRSA PCR Screening     Status: None   Collection Time: 12/19/15  1:15 PM  Result Value Ref Range Status   MRSA by PCR NEGATIVE NEGATIVE Final    Comment:        The GeneXpert MRSA Assay (FDA approved for NASAL specimens only), is one component of a comprehensive MRSA colonization surveillance program. It is not intended to diagnose MRSA infection nor to guide or monitor treatment for MRSA infections.      Studies:   Recent x-ray studies have been reviewed in detail by the Attending Physician  Scheduled Meds:  Scheduled Meds: . [START ON 12/20/2015] cefTRIAXone (ROCEPHIN)  IV  1 g Intravenous Q24H  . famotidine (PEPCID) IV  20 mg Intravenous Q12H  . ipratropium-albuterol  3 mL Nebulization Q4H  . sodium chloride flush  3 mL Intravenous Q12H    Time spent on care of this patient: No charge   Cherene Altes , MD   [facility] Office  [phone] Pager - Text Page per Amion as per below:  On-Call/Text Page:      [email or web address]      password [email or web address]  If 7PM-7AM, please contact night-coverage [email or web address] Password [email or web address] 12/19/2015, 3:56 PM   LOS: 0 days

## 2015-12-19 NOTE — ED Notes (Addendum)
Called Respiratory to come and assess patient. Requested patient back on the Bipap.

## 2015-12-19 NOTE — ED Notes (Signed)
Paged Admitting MD about patient's status.

## 2015-12-19 NOTE — Progress Notes (Signed)
Bipap not needed at this time. Order from ED

## 2015-12-19 NOTE — ED Notes (Signed)
Paged Admitting MD. No response from first page.
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
## 2015-12-19 NOTE — ED Notes (Addendum)
This RN was called by RN Chrislyn that patient's oxygen saturation had dropped to 84%. When Chrislyn RN came in at the time of the alarm around 0708, Pt's Bipap machine was dead and patient's mask remained on her face. Chrislyn, RN reported taking off mask and placing patient on [place]. Pt was placed on [employer] at 6L. Oxygen was at 98% upon arrival of this RN to the room. Pt was breathing equal and regular. Cap Refill and good pulses noted.[place] lowered to 4L with patient continuing to have good oxygen saturation at 98%. RN found Bipap machine unplugged from the wall. This RN plugged machine back into wall in which the machine turned on and began to run correctly. Paged RT to place patient back on the Bipap. Tourniquet found on patient's right wrist at this time. Tourniquet released. Patient's skin color was appropriate for ethnicity, pulses noted to be 2+ and regular, cap refill <3 upon assessment.

## 2015-12-19 NOTE — ED Notes (Signed)
Patient transported to MRI 

## 2015-12-19 NOTE — Plan of Care (Signed)
Problem: Safety: Goal: Ability to remain free from injury will improve Outcome: Progressing Patient becoming more alert and oriented. Reorientation needed PRN. Pt is compliant, presently. Family present at bedside. Family instructed to let staff know when they leave so the bed alarm can be activated. Fall prevention education and call light usage education given and understanding was verbalized.      Problem: Nutrition: Goal: Adequate nutrition will be maintained Patient currently NPO; diet discussed with MD; MD to address PRN

## 2015-12-19 NOTE — ED Notes (Signed)
Paged RT to complete Duone[place]

## 2015-12-19 NOTE — ED Notes (Signed)
Spoke with Admitting MD about patient's current state and the episode this morning at 0715 when he responded to the page. Verbalized understanding and no new orders at this time

## 2015-12-19 NOTE — Progress Notes (Signed)
Pharmacy Antibiotic Note  Caroline Hill is a 70 y.o. female admitted on 12/18/2015 with UTI.  Pharmacy has been consulted for Rocephin dosing.  Plan: Rocephin 1g IV Q24H.  Temp (24hrs), Avg:98.8 F (37.1 C), Min:98.8 F (37.1 C), Max:98.8 F (37.1 C)   Recent Labs Lab 12/18/15 1953 12/19/15 0035  WBC 9.9  --   CREATININE 1.01*  --   LATICACIDVEN  --  0.73       Allergies  Allergen Reactions  . Oxycodone-Acetaminophen Nausea And Vomiting  . Oxycontin [Oxycodone Hcl]      Thank you for allowing pharmacy to be a part of this patient's care.  Wynona Neat, PharmD, BCPS  12/19/2015 1:07 AM

## 2015-12-20 DIAGNOSIS — I272 Other secondary pulmonary hypertension: Secondary | ICD-10-CM

## 2015-12-20 DIAGNOSIS — F418 Other specified anxiety disorders: Secondary | ICD-10-CM

## 2015-12-20 DIAGNOSIS — J438 Other emphysema: Secondary | ICD-10-CM

## 2015-12-20 LAB — COMPREHENSIVE METABOLIC PANEL
ALBUMIN: 2.4 g/dL — AB (ref 3.5–5.0)
ALK PHOS: 72 U/L (ref 38–126)
ALT: 14 U/L (ref 14–54)
ANION GAP: 7 (ref 5–15)
AST: 18 U/L (ref 15–41)
BUN: 14 mg/dL (ref 6–20)
CALCIUM: 8.9 mg/dL (ref 8.9–10.3)
CO2: 36 mmol/L — AB (ref 22–32)
Chloride: 98 mmol/L — ABNORMAL LOW (ref 101–111)
Creatinine, Ser: 0.58 mg/dL (ref 0.44–1.00)
GFR calc Af Amer: >60 mL/min (ref >60)
GFR calc non Af Amer: >60 mL/min (ref >60)
GLUCOSE: 103 mg/dL — AB (ref 65–99)
Potassium: 3.4 mmol/L — ABNORMAL LOW (ref 3.5–5.1)
SODIUM: 141 mmol/L (ref 135–145)
Total Bilirubin: 0.5 mg/dL (ref 0.3–1.2)
Total Protein: 5.7 g/dL — ABNORMAL LOW (ref 6.5–8.1)

## 2015-12-20 LAB — CBC
HCT: 36.9 % (ref 36.0–46.0)
HEMOGLOBIN: 11.2 g/dL — AB (ref 12.0–15.0)
MCH: 28.1 pg (ref 26.0–34.0)
MCHC: 30.4 g/dL (ref 30.0–36.0)
MCV: 92.5 fL (ref 78.0–100.0)
Platelets: 256 10*3/uL (ref 150–400)
RBC: 3.99 MIL/uL (ref 3.87–5.11)
RDW: 14.2 % (ref 11.5–15.5)
WBC: 7 10*3/uL (ref 4.0–10.5)

## 2015-12-20 MED ORDER — FAMOTIDINE 20 MG PO TABS
20.0000 mg | ORAL_TABLET | Freq: Two times a day (BID) | ORAL | Status: DC
  Administered 2015-12-20 – 2015-12-21 (×2): 20 mg via ORAL
  Filled 2015-12-20 (×2): qty 1

## 2015-12-20 MED ORDER — POTASSIUM CHLORIDE 10 MEQ/100ML IV SOLN
10.0000 meq | INTRAVENOUS | Status: AC
  Administered 2015-12-20 (×4): 10 meq via INTRAVENOUS
  Filled 2015-12-20 (×4): qty 100

## 2015-12-20 NOTE — Progress Notes (Signed)
PROGRESS NOTE    Caroline Hill  SEG:[id number] DOB: [birth year] DOA: 12/18/2015 PCP: [name], MD   Brief Narrative:  70 y.o. WF PMHx Depression,Schizophrenia and Bipolar disorder (she is actively followed by a mental health specialist),  CAD native artery S/P stent placement, COPD on home oxygen 3L [place] qHS and prn at baseline, Tobacco abuse, HTN, Pulmonary Hypertension   who is accompanied by her husband tonight. She is unable to give any meaningful history due to altered mental status. Her husband reports that she has demonstrated increased lethargy for the past several days. He reports that she has slept almost continuously, unless he actively tries to wake her up or engage with her, since Friday. At times, she has had garbled, incomprehensible speech. He has not noticed facial droop. He has not noticed asymmetrical weakness in her extremities. She has had difficulty ambulating recently due to low back pain and sciatica, which she treats with as many as four BC powders daily. No hematemesis. She has not complained of headache, chest pain, or shortness of breath, though she has started using her oxygen more during the day. No nausea or vomiting. No known fever. No diarrhea. No mosquito or tick bites. In the past few weeks, she has had intermittent episodes of confusion and disorientation. Today, he found the patient on the floor in their home, too weak to get up. This prompted presentation to the ED for further evaluation.   Assessment & Plan:   Principal Problem:   Acute encephalopathy Active Problems:   HTN (hypertension)   Anxiety and depression   Tobacco abuse   CAD in native artery   COPD (chronic obstructive pulmonary disease) (HCC)   UTI (lower urinary tract infection)   Pulmonary hypertension (HCC)   Acute encephalopathy -Resolved -Ambulate patient hallway q shift  Anxiety, depression, Bipolar -Benztropine 0.5 mg QHS -Buspirone 10 mg BID -Risperdal 2 mg  QHS  UTI -Complete 5 days antibiotics. Awaiting culture findings   COPD  -Titrate O2 to maintain SPO2 89 and 93% -Pulmicort nebulizer BID  HTN -Amlodipine 10 mg daily  CAD, remote stents   Pulmonary Hypertension    Hypokalemia  -Potassium IV 40 mEq     DVT prophylaxis: SCD Code Status: Full Family Communication: None available Disposition Plan: Discharge next 24-48 hours   Consultants:  None  Procedures/Significant Events:  6/4 CXR; COPD 6/4 CT head WO contrast; negative acute infarct.--Moderate chronic small vessel disease 6/5 MRI brain W/WO contrast; negative acute infarct   Cultures 6/4 urine pending 6/5 negative MRSA by PCR  Antimicrobials: Ceftriaxone 6/5>>   Devices    LINES / TUBES:      Continuous Infusions: . sodium chloride 50 mL/hr at 12/19/15 1733     Subjective: 6/6  A/O 4, NAD, sitting in bed reading newspaper. States lives at home with her husband.    Objective: Filed Vitals:   12/20/15 7371 12/20/15 0816 12/20/15 0819 12/20/15 0904  BP:    120/70  Pulse:      Temp: 99.7 F (37.6 C)     TempSrc: Oral     Resp:      SpO2:  99% 97%     Intake/Output Summary (Last 24 hours) at 12/20/15 1049 Last data filed at 12/20/15 0900  Gross per 24 hour  Intake    360 ml  Output   2100 ml  Net  -1740 ml   There were no vitals filed for this visit.  Examination:  General: A/O 4, NAD, cachectic,  positive chronic respiratory distress (on 3 L O2 via [place]) Eyes: negative scleral hemorrhage, negative anisocoria, negative icterus ENT: Negative Runny nose, negative gingival bleeding, Neck:  Negative scars, masses, torticollis, lymphadenopathy, JVD Lungs: Clear to auscultation bilaterally without wheezes or crackles Cardiovascular: Regular rate and rhythm without murmur gallop or rub normal S1 and S2 Abdomen: negative abdominal pain, nondistended, positive soft, bowel sounds, no rebound, no ascites, no appreciable  mass Extremities: No significant cyanosis, clubbing, or edema bilateral lower extremities Skin: Negative rashes, lesions, ulcers Psychiatric:  Negative depression, negative anxiety, negative fatigue, negative mania  Central nervous system:  Cranial nerves II through XII intact, tongue/uvula midline, all extremities muscle strength 5/5, sensation intact throughout, negative dysarthria, negative expressive aphasia, negative receptive aphasia.  .     Data Reviewed: Care during the described time interval was provided by me .  I have reviewed this patient's available data, including medical history, events of note, physical examination, and all test results as part of my evaluation. I have personally reviewed and interpreted all radiology studies.  CBC:  Recent Labs Lab 12/18/15 1953 12/19/15 0617 12/20/15 0237  WBC 9.9 8.6 7.0  HGB 12.3 11.0* 11.2*  HCT 39.1 35.5* 36.9  MCV 92.2 92.7 92.5  PLT 242 245 867   Basic Metabolic Panel:  Recent Labs Lab 12/18/15 1953 12/19/15 0023 12/19/15 0617 12/20/15 0237  NA 140  --  140 141  K 3.5  --  3.4* 3.4*  CL 96*  --  99* 98*  CO2 37*  --  35* 36*  GLUCOSE 112*  --  88 103*  BUN 19  --  20 14  CREATININE 1.01*  --  0.68 0.58  CALCIUM 9.5  --  8.9 8.9  MG  --  1.8  --   --    GFR: CrCl cannot be calculated (Unknown ideal weight.). Liver Function Tests:  Recent Labs Lab 12/19/15 0023 12/20/15 0237  AST 19 18  ALT 16 14  ALKPHOS 80 72  BILITOT 0.5 0.5  PROT 6.3* 5.7*  ALBUMIN 2.9* 2.4*   No results for input(s): LIPASE, AMYLASE in the last 168 hours.  Recent Labs Lab 12/19/15 0023  AMMONIA 37*   Coagulation Profile: No results for input(s): INR, PROTIME in the last 168 hours. Cardiac Enzymes:  Recent Labs Lab 12/18/15 2145  TROPONINI <0.03   BNP (last 3 results) No results for input(s): PROBNP in the last 8760 hours. HbA1C: No results for input(s): HGBA1C in the last 72 hours. CBG: No results for input(s):  GLUCAP in the last 168 hours. Lipid Profile: No results for input(s): CHOL, HDL, LDLCALC, TRIG, CHOLHDL, LDLDIRECT in the last 72 hours. Thyroid Function Tests:  Recent Labs  12/19/15 0023  TSH 1.227  FREET4 0.98   Anemia Panel: No results for input(s): VITAMINB12, FOLATE, FERRITIN, TIBC, IRON, RETICCTPCT in the last 72 hours. Urine analysis:    Component Value Date/Time   COLORURINE ORANGE* 12/18/2015 2355   APPEARANCEUR CLOUDY* 12/18/2015 2355   LABSPEC 1.021 12/18/2015 2355   PHURINE 5.5 12/18/2015 2355   GLUCOSEU NEGATIVE 12/18/2015 2355   HGBUR LARGE* 12/18/2015 2355   BILIRUBINUR SMALL* 12/18/2015 2355   KETONESUR 15* 12/18/2015 2355   PROTEINUR 100* 12/18/2015 2355   NITRITE NEGATIVE 12/18/2015 2355   LEUKOCYTESUR MODERATE* 12/18/2015 2355   Sepsis Labs: '@LABRCNTIP'$ (procalcitonin:4,lacticidven:4)  ) Recent Results (from the past 240 hour(s))  Culture, Urine     Status: Abnormal (Preliminary result)   Collection Time: 12/18/15 11:55 PM  Result Value Ref Range Status   Specimen Description URINE, CATHETERIZED  Final   Special Requests ADDED [id number]  Final   Culture >=100,000 COLONIES/mL GRAM NEGATIVE RODS (A)  Final   Report Status PENDING  Incomplete  MRSA PCR Screening     Status: None   Collection Time: 12/19/15  1:15 PM  Result Value Ref Range Status   MRSA by PCR NEGATIVE NEGATIVE Final    Comment:        The GeneXpert MRSA Assay (FDA approved for NASAL specimens only), is one component of a comprehensive MRSA colonization surveillance program. It is not intended to diagnose MRSA infection nor to guide or monitor treatment for MRSA infections.          Radiology Studies: Dg Chest 2 View  12/18/2015  CLINICAL DATA:  Weakness and syncope EXAM: CHEST  2 VIEW COMPARISON:  12/05/2013 FINDINGS: Normal heart size and negative aortic contours. Hyperinflation from known COPD. There is no edema, consolidation, effusion, or pneumothorax. Thoracic  levoscoliosis. Remote right humeral neck fracture. No acute osseous finding. IMPRESSION: COPD without acute superimposed finding. Electronically Signed   By: Monte Fantasia M.D.   On: 12/18/2015 23:02   Ct Head Wo Contrast  12/18/2015  CLINICAL DATA:  Fall.  Unresponsive. EXAM: CT HEAD WITHOUT CONTRAST TECHNIQUE: Contiguous axial images were obtained from the base of the skull through the vertex without intravenous contrast. COMPARISON:  None. FINDINGS: Skull and Sinuses:Negative for fracture or hemo sinus Visualized orbits: No posttraumatic finding. Brain: No evidence of acute infarction, hemorrhage, hydrocephalus, or mass lesion/mass effect. Normal cerebral volume. Moderate confluent Christians matter disease in the posterior cerebral hemispheres, usually chronic microvascular disease (patient has multiple vascular risk factors). IMPRESSION: 1. No acute finding. 2. Moderate chronic small vessel disease. Electronically Signed   By: Monte Fantasia M.D.   On: 12/18/2015 23:24   Mr Jeri Cos PN Contrast  12/19/2015  CLINICAL DATA:  70 year old hypertensive female with bipolar disorder and schizophrenia presenting with altered mental status. Subsequent encounter. EXAM: MRI HEAD WITHOUT AND WITH CONTRAST TECHNIQUE: Multiplanar, multiecho pulse sequences of the brain and surrounding structures were obtained without and with intravenous contrast. CONTRAST:  57m MULTIHANCE GADOBENATE DIMEGLUMINE 529 MG/ML IV SOLN COMPARISON:  12/18/2015 head CT. FINDINGS: No acute infarct or intracranial hemorrhage. Prominent chronic microvascular changes most notable periventricular region. No intracranial enhancing lesion noted on motion degraded post-contrast images. Mild global atrophy without hydrocephalus. Major intracranial vascular structures are patent. Minimal mucosal thickening paranasal sinuses and mastoid air cells. No orbital abnormality noted. Major intracranial vascular structures are patent. Cervical medullary junction  within normal limits. Mild narrowing upper cervical spine. IMPRESSION: No acute infarct. Prominent chronic microvascular changes. Electronically Signed   By: SGenia DelM.D.   On: 12/19/2015 12:45        Scheduled Meds: . amLODipine  10 mg Oral Daily  . aspirin EC  81 mg Oral QHS  . benztropine  0.5 mg Oral QHS  . budesonide (PULMICORT) nebulizer solution  0.25 mg Nebulization BID  . busPIRone  10 mg Oral BID  . cefTRIAXone (ROCEPHIN)  IV  1 g Intravenous Q24H  . famotidine  20 mg Oral BID  . ipratropium-albuterol  3 mL Nebulization TID  . multivitamin with minerals  1 tablet Oral BH-q7a  . potassium chloride  10 mEq Intravenous Q1 Hr x 4  . risperiDONE  2 mg Oral QHS   Continuous Infusions: . sodium chloride 50 mL/hr at 12/19/15 1733  LOS: 1 day    Time spent: 40 minutes    WOODS, Geraldo Docker, MD [facility] Pager [phone]   If 7PM-7AM, please contact night-coverage [email or web address] Password [email or web address] 12/20/2015, 10:49 AM

## 2015-12-20 NOTE — Progress Notes (Signed)
Patient trasfered from [facility] to 5W21 via bed; alert and oriented x 4; complaints of pain in her left hip (chronic pain); IV saline locked in LFA and fluids running in RFA; skin intact; husband at bedside. Orient patient to room and unit; instructed how to use the call bell and  fall risk precautions. Will continue to monitor the patient.

## 2015-12-21 LAB — URINE CULTURE
CULTURE: NO GROWTH
Culture: >=100000 — AB

## 2015-12-21 MED ORDER — CEFUROXIME AXETIL 500 MG PO TABS
500.0000 mg | ORAL_TABLET | Freq: Two times a day (BID) | ORAL | Status: DC
  Filled 2015-12-21: qty 1

## 2015-12-21 MED ORDER — CEFUROXIME AXETIL 500 MG PO TABS: 500.0000 mg | ORAL_TABLET | Freq: Two times a day (BID) | ORAL | Status: DC

## 2015-12-21 NOTE — Care Management Note (Signed)
Case Management Note  Patient Details  Name: SHAUNIECE KWAN MRN: [id number] Date of Birth: 07/23/1945  Subjective/Objective:                Admitted with acute encephalopathy.    Action/Plan:  Plan is to d/c to home today.  Expected Discharge Date:       12/20/2015           Expected Discharge Plan:  Home/Self Care  In-House Referral:     Discharge planning Services  CM Consult  Post Acute Care Choice:  Resumption of Svcs/PTA Provider Choice offered to:     DME Arranged:  Oxygen DME Agency:  [facility] (services pt's oxygen)  HH Arranged:    HH Agency:     Status of Service:  Completed, signed off  Medicare Important Message Given:   Date Medicare IM Given:    Medicare IM give by:    Date Additional Medicare IM Given:    Additional Medicare Important Message give by:     If discussed at [place] of Stay Meetings, dates discussed:    Additional Comments:  Sharin Mons, RN 12/21/2015, 2:03 PM

## 2015-12-21 NOTE — Care Management Important Message (Signed)
Important Message  Patient Details  Name: Caroline Hill MRN: [id number] Date of Birth: [birth year]   Medicare Important Message Given:  Yes    Nathen May 12/21/2015, 10:18 AM

## 2015-12-21 NOTE — Discharge Summary (Signed)
DISCHARGE SUMMARY  MISTI TOWLE  MR#: [id number]  DOB:[birth year]  Date of Admission: 12/18/2015 Date of Discharge: 12/21/2015  Attending Physician:MCCLUNG,JEFFREY T  Patient's VPX:TGGYIR,SWNIOEV C, MD  Consults:  none  Disposition: D/C home   Follow-up Appts:     Follow-up Information    Follow up with BADGER,MICHAEL C, MD. Schedule an appointment as soon as possible for a visit in 1 week.   Specialty:  Family Medicine   Contact information:   [place] [address] [phone]       Tests Needing Follow-up: -assess mental status  -f/u K+  Discharge Diagnoses: Acute encephalopathy UTI - E coli  Mild Hypokalemia  COPD  HTN Anxiety, depression, Bipolar CAD, remote stents  Initial presentation: 70 y.o. F with a history of CAD S/P stent placement, COPD on home oxygen 3L [place] qHS and prn at baseline, HTN, depression, and Bipolar disorder who demonstrated increased lethargy over several days. At times, she had garbled, incomprehensible speech but no facial droop or asymmetric weakness in her extremities. Her husband found her on the floor in their home, too weak to get up, and brought her to the ED.  In the ED ABG showed pH 7.33, pCO2 71, pO2 85, bicarb 40. No fever. No leukocytosis. First troponin negative. No acute EKG changes. Normal lactic acid. Chest xray showed changes consistent with COPD but no acute superimposed findings. Head CT showed moderate chronic small vessel disease. U/A suggested infection. Empiric Rocephin ordered.  Hospital Course:  Acute toxic metabolic encephalopathy + acute delirium Felt to be due to UTI as well as probable low grade baseline dementia w/ an element of sundowning - mental status has waxed and waned during hospital stay, but has improved overall - I had a lengthy discussion with the patient's husband at the time of her discharge - he is concerned about her persistent low-grade confusion but does tell me that  he feels confident he can take care of her and watch her at home - I have explained sundowning to him and the likelihood that her mental status will continue to improve when she returns to her usual home surrounding - I have instructed him to bring her back to the emergency room should her confusion get worse - I have instructed him to contact her primary care provider should her confusion did not improve within 48-72 hours after returning home  UTI - E coli  To complete 7 days of abx tx   Mild Hypokalemia  Due to poor intake - stable at time of d/c   COPD  Well compensated at time of discharge - continue home oxygen therapy - no changes in medical therapy  HTN Blood pressure well-controlled time of discharge  Anxiety, depression, Bipolar  CAD, remote stents Asymptomatic    Medication List    STOP taking these medications        clonazePAM 0.5 MG tablet  Commonly known as:  KLONOPIN     diltiazem 30 MG tablet  Commonly known as:  CARDIZEM      TAKE these medications        amLODipine-benazepril 5-10 MG capsule  Commonly known as:  LOTREL  Take 1 capsule by mouth daily.     arformoterol 15 MCG/2ML Nebu  Commonly known as:  BROVANA  Take 2 mLs (15 mcg total) by nebulization 2 (two) times daily.     aspirin EC 81 MG tablet  Take 81 mg by mouth at bedtime.  benztropine 0.5 MG tablet  Commonly known as:  COGENTIN  Take 0.5 mg by mouth at bedtime.     budesonide 0.5 MG/2ML nebulizer solution  Commonly known as:  PULMICORT  Take 2 mLs (0.5 mg total) by nebulization 2 (two) times daily.     busPIRone 10 MG tablet  Commonly known as:  BUSPAR  Take 10 mg by mouth 2 (two) times daily.     cefUROXime 500 MG tablet  Commonly known as:  CEFTIN  Take 1 tablet (500 mg total) by mouth 2 (two) times daily with a meal.     Cholecalciferol 5000 units Tabs  Take 5,000 Units by mouth daily.     FLUoxetine 40 MG capsule  Commonly known as:  PROZAC  Take 40 mg by mouth  every morning.     lamoTRIgine 100 MG tablet  Commonly known as:  LAMICTAL  Take 100 mg by mouth daily with breakfast.     levalbuterol 0.63 MG/3ML nebulizer solution  Commonly known as:  XOPENEX  Take 3 mLs (0.63 mg total) by nebulization every 3 (three) hours as needed for wheezing or shortness of breath.     multivitamin with minerals Tabs tablet  Take 1 tablet by mouth every morning.     OXYGEN  Inhale 3 L into the lungs continuous. Continuous every night and 4 times a day as needed during the day     pantoprazole 40 MG tablet  Commonly known as:  PROTONIX  Take 1 tablet (40 mg total) by mouth daily at 12 noon.     PROAIR HFA 108 (90 Base) MCG/ACT inhaler  Generic drug:  albuterol  INHALE 2 PUFFS INTO THE LUNGS EVERY 4 (FOUR) HOURS AS NEEDED FOR WHEEZING OR SHORTNESS OF BREATH.     risperiDONE 2 MG tablet  Commonly known as:  RISPERDAL  Take 2 mg by mouth at bedtime.     traZODone 100 MG tablet  Commonly known as:  DESYREL  Take 50 mg by mouth at bedtime.        Day of Discharge BP 138/64 mmHg  Pulse 86  Temp(Src) 97.8 F (36.6 C) (Oral)  Resp 20  Ht '5\' 6"'$  (1.676 m)  Wt 49.8 kg (109 lb 12.6 oz)  BMI 17.73 kg/m2  SpO2 94%  Physical Exam: General: No acute respiratory distress Lungs: Clear to auscultation bilaterally without wheezes or crackles Cardiovascular: Regular rate and rhythm without murmur gallop or rub normal S1 and S2 Abdomen: Nontender, nondistended, soft, bowel sounds positive, no rebound, no ascites, no appreciable mass Extremities: No significant cyanosis, clubbing, or edema bilateral lower extremities  Basic Metabolic Panel:  Recent Labs Lab 12/18/15 1953 12/19/15 0023 12/19/15 0617 12/20/15 0237  NA 140  --  140 141  K 3.5  --  3.4* 3.4*  CL 96*  --  99* 98*  CO2 37*  --  35* 36*  GLUCOSE 112*  --  88 103*  BUN 19  --  20 14  CREATININE 1.01*  --  0.68 0.58  CALCIUM 9.5  --  8.9 8.9  MG  --  1.8  --   --     Liver Function  Tests:  Recent Labs Lab 12/19/15 0023 12/20/15 0237  AST 19 18  ALT 16 14  ALKPHOS 80 72  BILITOT 0.5 0.5  PROT 6.3* 5.7*  ALBUMIN 2.9* 2.4*    Recent Labs Lab 12/19/15 0023  AMMONIA 37*    CBC:  Recent Labs Lab 12/18/15 1953  12/19/15 0617 12/20/15 0237  WBC 9.9 8.6 7.0  HGB 12.3 11.0* 11.2*  HCT 39.1 35.5* 36.9  MCV 92.2 92.7 92.5  PLT 242 245 256    Cardiac Enzymes:  Recent Labs Lab 12/18/15 2145  TROPONINI <0.03    Recent Results (from the past 240 hour(s))  Culture, Urine     Status: Abnormal   Collection Time: 12/18/15 11:55 PM  Result Value Ref Range Status   Specimen Description URINE, CATHETERIZED  Final   Special Requests ADDED [id number]  Final   Culture >=100,000 COLONIES/mL ESCHERICHIA COLI (A)  Final   Report Status 12/21/2015 FINAL  Final   Organism ID, Bacteria ESCHERICHIA COLI (A)  Final      Susceptibility   Escherichia coli - MIC*    AMPICILLIN <=2 SENSITIVE Sensitive     CEFAZOLIN <=4 SENSITIVE Sensitive     CEFTRIAXONE <=1 SENSITIVE Sensitive     CIPROFLOXACIN <=0.25 SENSITIVE Sensitive     GENTAMICIN <=1 SENSITIVE Sensitive     IMIPENEM <=0.25 SENSITIVE Sensitive     NITROFURANTOIN <=16 SENSITIVE Sensitive     TRIMETH/SULFA <=20 SENSITIVE Sensitive     AMPICILLIN/SULBACTAM <=2 SENSITIVE Sensitive     PIP/TAZO <=4 SENSITIVE Sensitive     * >=100,000 COLONIES/mL ESCHERICHIA COLI  MRSA PCR Screening     Status: None   Collection Time: 12/19/15  1:15 PM  Result Value Ref Range Status   MRSA by PCR NEGATIVE NEGATIVE Final    Comment:        The GeneXpert MRSA Assay (FDA approved for NASAL specimens only), is one component of a comprehensive MRSA colonization surveillance program. It is not intended to diagnose MRSA infection nor to guide or monitor treatment for MRSA infections.   Culture, Urine     Status: None   Collection Time: 12/20/15 11:00 AM  Result Value Ref Range Status   Specimen Description URINE,  CATHETERIZED  Final   Special Requests NONE  Final   Culture NO GROWTH  Final   Report Status 12/21/2015 FINAL  Final     Time spent in discharge (includes decision making & examination of pt): >30 minutes  12/21/2015, 2:05 PM   Cherene Altes, MD [facility] Office  [phone] Pager [phone]  On-Call/Text Page:      [email or web address]      password [facility]

## 2015-12-21 NOTE — Discharge Instructions (Signed)

## 2015-12-21 NOTE — Progress Notes (Signed)
Pt given discharge instructions, prescriptions, and care notes. Pt verbalized understanding AEB no further questions or concerns at this time. IV was discontinued, no redness, pain, or swelling noted at this time. Telemetry discontinued and Centralized Telemetry was notified. Pt left the floor via wheelchair with staff in stable condition. 

## 2016-01-07 ENCOUNTER — Encounter (HOSPITAL_COMMUNITY): Payer: Self-pay | Admitting: Emergency Medicine

## 2016-01-07 ENCOUNTER — Inpatient Hospital Stay (HOSPITAL_COMMUNITY)
Admission: EM | Admit: 2016-01-07 | Discharge: 2016-01-16 | DRG: 480 | Disposition: A | Discharge status: Skilled Nursing Facility | Payer: Medicare HMO | Attending: Internal Medicine | Admitting: Internal Medicine

## 2016-01-07 ENCOUNTER — Other Ambulatory Visit: Payer: Self-pay

## 2016-01-07 DIAGNOSIS — R0602 Shortness of breath: Secondary | ICD-10-CM | POA: Diagnosis present

## 2016-01-07 DIAGNOSIS — R55 Syncope and collapse: Secondary | ICD-10-CM | POA: Diagnosis present

## 2016-01-07 DIAGNOSIS — R451 Restlessness and agitation: Secondary | ICD-10-CM | POA: Diagnosis not present

## 2016-01-07 DIAGNOSIS — E43 Unspecified severe protein-calorie malnutrition: Secondary | ICD-10-CM | POA: Diagnosis present

## 2016-01-07 DIAGNOSIS — S72442A Displaced fracture of lower epiphysis (separation) of left femur, initial encounter for closed fracture: Secondary | ICD-10-CM | POA: Diagnosis present

## 2016-01-07 DIAGNOSIS — J9622 Acute and chronic respiratory failure with hypercapnia: Secondary | ICD-10-CM | POA: Diagnosis not present

## 2016-01-07 DIAGNOSIS — F17210 Nicotine dependence, cigarettes, uncomplicated: Secondary | ICD-10-CM | POA: Diagnosis present

## 2016-01-07 DIAGNOSIS — F203 Undifferentiated schizophrenia: Secondary | ICD-10-CM | POA: Diagnosis present

## 2016-01-07 DIAGNOSIS — Z7982 Long term (current) use of aspirin: Secondary | ICD-10-CM

## 2016-01-07 DIAGNOSIS — I2510 Atherosclerotic heart disease of native coronary artery without angina pectoris: Secondary | ICD-10-CM | POA: Diagnosis present

## 2016-01-07 DIAGNOSIS — Y92008 Other place in unspecified non-institutional (private) residence as the place of occurrence of the external cause: Secondary | ICD-10-CM

## 2016-01-07 DIAGNOSIS — I471 Supraventricular tachycardia: Secondary | ICD-10-CM | POA: Diagnosis not present

## 2016-01-07 DIAGNOSIS — E876 Hypokalemia: Secondary | ICD-10-CM | POA: Diagnosis not present

## 2016-01-07 DIAGNOSIS — Z9981 Dependence on supplemental oxygen: Secondary | ICD-10-CM

## 2016-01-07 DIAGNOSIS — I10 Essential (primary) hypertension: Secondary | ICD-10-CM | POA: Diagnosis present

## 2016-01-07 DIAGNOSIS — Z23 Encounter for immunization: Secondary | ICD-10-CM

## 2016-01-07 DIAGNOSIS — J449 Chronic obstructive pulmonary disease, unspecified: Secondary | ICD-10-CM | POA: Diagnosis present

## 2016-01-07 DIAGNOSIS — Z79899 Other long term (current) drug therapy: Secondary | ICD-10-CM

## 2016-01-07 DIAGNOSIS — Z720 Tobacco use: Secondary | ICD-10-CM | POA: Diagnosis present

## 2016-01-07 DIAGNOSIS — Z90710 Acquired absence of both cervix and uterus: Secondary | ICD-10-CM

## 2016-01-07 DIAGNOSIS — G4700 Insomnia, unspecified: Secondary | ICD-10-CM | POA: Diagnosis not present

## 2016-01-07 DIAGNOSIS — Z681 Body mass index (BMI) 19 or less, adult: Secondary | ICD-10-CM

## 2016-01-07 DIAGNOSIS — D62 Acute posthemorrhagic anemia: Secondary | ICD-10-CM | POA: Diagnosis not present

## 2016-01-07 DIAGNOSIS — J9611 Chronic respiratory failure with hypoxia: Secondary | ICD-10-CM | POA: Diagnosis present

## 2016-01-07 DIAGNOSIS — Z885 Allergy status to narcotic agent status: Secondary | ICD-10-CM

## 2016-01-07 DIAGNOSIS — Z419 Encounter for procedure for purposes other than remedying health state, unspecified: Secondary | ICD-10-CM

## 2016-01-07 DIAGNOSIS — S72352A Displaced comminuted fracture of shaft of left femur, initial encounter for closed fracture: Principal | ICD-10-CM | POA: Diagnosis present

## 2016-01-07 DIAGNOSIS — Z955 Presence of coronary angioplasty implant and graft: Secondary | ICD-10-CM

## 2016-01-07 DIAGNOSIS — D72829 Elevated white blood cell count, unspecified: Secondary | ICD-10-CM | POA: Diagnosis present

## 2016-01-07 DIAGNOSIS — F319 Bipolar disorder, unspecified: Secondary | ICD-10-CM | POA: Diagnosis present

## 2016-01-07 DIAGNOSIS — J9601 Acute respiratory failure with hypoxia: Secondary | ICD-10-CM | POA: Diagnosis present

## 2016-01-07 DIAGNOSIS — J9600 Acute respiratory failure, unspecified whether with hypoxia or hypercapnia: Secondary | ICD-10-CM

## 2016-01-07 DIAGNOSIS — W1830XA Fall on same level, unspecified, initial encounter: Secondary | ICD-10-CM | POA: Diagnosis present

## 2016-01-07 DIAGNOSIS — J9621 Acute and chronic respiratory failure with hypoxia: Secondary | ICD-10-CM | POA: Diagnosis not present

## 2016-01-07 DIAGNOSIS — E46 Unspecified protein-calorie malnutrition: Secondary | ICD-10-CM | POA: Diagnosis present

## 2016-01-07 DIAGNOSIS — S72402A Unspecified fracture of lower end of left femur, initial encounter for closed fracture: Secondary | ICD-10-CM

## 2016-01-07 NOTE — ED Notes (Signed)
Pt arrives via EMS for fall, states attempting to step up and knee fell out. Splinted by EMS and placed in position in comfort. Deformity noted to L distal femur. Initial BP 160/100, HR 102. No impact to head. Given 200MCG fentanyl PTA.

## 2016-01-07 NOTE — ED Provider Notes (Signed)
TIME SEEN:  By signing my name below, I, Arianna Nassar, attest that this documentation has been prepared under the direction and in the presence of [employer], DO.  Electronically Signed: Julien Nordmann, ED Scribe. 01/07/2016. 11:51 PM.   CHIEF COMPLAINT: Fall, left thigh pain  HPI:  HPI Comments: Caroline Hill is a 70 y.o. female brought in by ambulance, who has a PMHx of HTN, HLD, COPD on intermittent home oxygen, and CAD presents to the Emergency Department complaining of a sudden onset, gradual worsening fall onset PTA. According to EMS, pt was at home when she stood up and went to take a step up out of her living room and her knee gave out causing her to land on her left side. Pt has an obvious deformity to her left distal femur. Pt says when she landed, she hit her head but did not lose consciousness. She reports feeling normal today before the fall occurred but her husband states pt has been complaining of left leg numbness and pain above this knee recently. Per EMS, pt was tachycardic en route, hypertensive, and she received fentanyl to alleviate her pain with relief. Pt is on O2 at home when needed and ambulates with a cane. Husband states she is not currently on any blood thinners. No cp, neck pain, numbness, tingling, focal weakness, fever, cough, vomiting or diarrhea.   ROS: See HPI Constitutional: no fever  Eyes: no drainage  ENT: no runny nose   Cardiovascular:  no chest pain  Resp: no SOB  GI: no vomiting GU: no dysuria Integumentary: no rash  Allergy: no hives  Musculoskeletal: no leg swelling  Neurological: no slurred speech ROS otherwise negative  PAST MEDICAL HISTORY/PAST SURGICAL HISTORY:  Past Medical History  Diagnosis Date  . Hypertension   . Bipolar disorder, unspecified ([place])   . Unspecified constipation   . Other malaise and fatigue   . Unspecified vitamin D deficiency   . Tobacco use disorder   . Hyperlipidemia   . COPD (chronic obstructive pulmonary  disease) ([place])   . Shortness of breath     with exertion   . Neuromuscular disorder ([place])   . Depression   . CAD (coronary artery disease)   . On home oxygen therapy     "3L at night and during day prn" (12/01/2013)  . Arthritis     "hands, feet" (12/01/2013)  . Schizophrenia ([place])     MEDICATIONS:  Prior to Admission medications   Medication Sig Start Date End Date Taking? Authorizing Provider  amLODipine-benazepril (LOTREL) 5-10 MG capsule Take 1 capsule by mouth daily. 11/04/15   Historical Provider, MD  arformoterol (BROVANA) 15 MCG/2ML NEBU Take 2 mLs (15 mcg total) by nebulization 2 (two) times daily. Patient not taking: Reported on 12/19/2015 08/31/14   Juanito Doom, MD  aspirin EC 81 MG tablet Take 81 mg by mouth at bedtime.     Historical Provider, MD  benztropine (COGENTIN) 0.5 MG tablet Take 0.5 mg by mouth at bedtime. 12/09/15   Historical Provider, MD  budesonide (PULMICORT) 0.5 MG/2ML nebulizer solution Take 2 mLs (0.5 mg total) by nebulization 2 (two) times daily. Patient not taking: Reported on 12/19/2015 08/31/14   Juanito Doom, MD  busPIRone (BUSPAR) 10 MG tablet Take 10 mg by mouth 2 (two) times daily. 04/21/15   Historical Provider, MD  cefUROXime (CEFTIN) 500 MG tablet Take 1 tablet (500 mg total) by mouth 2 (two) times daily with a meal. 12/21/15  Cherene Altes, MD  Cholecalciferol 5000 UNITS TABS Take 5,000 Units by mouth daily.    Historical Provider, MD  FLUoxetine (PROZAC) 40 MG capsule Take 40 mg by mouth every morning. 11/21/15   Historical Provider, MD  lamoTRIgine (LAMICTAL) 100 MG tablet Take 100 mg by mouth daily with breakfast.     Historical Provider, MD  levalbuterol (XOPENEX) 0.63 MG/3ML nebulizer solution Take 3 mLs (0.63 mg total) by nebulization every 3 (three) hours as needed for wheezing or shortness of breath. Patient not taking: Reported on 12/19/2015 12/18/13   Melvenia Needles, NP  Multiple Vitamin (MULTIVITAMIN WITH MINERALS) TABS tablet Take 1  tablet by mouth every morning.     Historical Provider, MD  OXYGEN Inhale 3 L into the lungs continuous. Continuous every night and 4 times a day as needed during the day    Historical Provider, MD  pantoprazole (PROTONIX) 40 MG tablet Take 1 tablet (40 mg total) by mouth daily at 12 noon. Patient not taking: Reported on 12/19/2015 12/08/13   Jonetta Osgood, MD  PROAIR HFA 108 [phone] Base) MCG/ACT inhaler INHALE 2 PUFFS INTO THE LUNGS EVERY 4 (FOUR) HOURS AS NEEDED FOR WHEEZING OR SHORTNESS OF BREATH. 08/08/15   Juanito Doom, MD  risperiDONE (RISPERDAL) 2 MG tablet Take 2 mg by mouth at bedtime. 04/22/15   Historical Provider, MD  traZODone (DESYREL) 100 MG tablet Take 50 mg by mouth at bedtime.  10/12/15   Historical Provider, MD    ALLERGIES:  Allergies  Allergen Reactions  . Oxycodone-Acetaminophen Nausea And Vomiting  . Oxycontin [Oxycodone Hcl]     SOCIAL HISTORY:  Social History  Substance Use Topics  . Smoking status: Current Every Day Smoker -- 1.00 packs/day for 51 years    Types: Cigarettes    Last Attempt to Quit: 11/26/2013  . Smokeless tobacco: Never Used     Comment: smokes 5 cigarettes daily (11/25/14)  . Alcohol Use: No    FAMILY HISTORY: Family History  Problem Relation Age of Onset  . Heart disease Mother   . Colon cancer Neg Hx   . Brain cancer Paternal Grandfather     EXAM: Triage vitals: BP 164/85 mmHg  Pulse 141  Temp(Src) 97.8 F (36.6 C) (Oral)  Resp 20  SpO2 100% CONSTITUTIONAL: Alert and oriented to place and person but not to time, and responds appropriately to questions. Chronically ill appearing; GCS 15 HEAD: Normocephalic; atraumatic EYES: Conjunctivae clear, PERRL, EOMI ENT: normal nose; no rhinorrhea; moist mucous membranes; pharynx without lesions noted; no dental injury; no septal hematoma NECK: Supple, no meningismus, no LAD; no midline spinal tenderness, step-off or deformity CARD: Regular and tachycardic; S1 and S2 appreciated; no  murmurs, no clicks, no rubs, no gallops RESP: Normal chest excursion without splinting or tachypnea; breath sounds clear and equal bilaterally; no wheezes, no rhonchi, no rales; no hypoxia or respiratory distress CHEST:  chest wall stable, no crepitus or ecchymosis or deformity, nontender to palpation ABD/GI: Normal bowel sounds; non-distended; soft, non-tender, no rebound, no guarding PELVIS:  stable, nontender to palpation BACK:  The back appears normal and is non-tender to palpation, there is no CVA tenderness; no midline spinal tenderness, step-off or deformity EXT: obvious deformity and tenderness of left distal femur, keeps her hip and knee at 90 degrees flexion for comfort, 2+ DP pulses bilaterally, no tenderness over the left tibia, fibula, ankle or foot, decreased ROM secondary to pain otherwise ,normal ROM in all joints; non-tender to palpation; no  edema; normal capillary refill; no cyanosis, compartments are soft, no joint effusion, no ecchymosis or lacerations    SKIN: Normal color for age and race; warm NEURO: Moves all extremities equally, sensation to light touch intact diffusely, cranial nerves II through XII intact PSYCH: The patient's mood and manner are appropriate. Grooming and personal hygiene are appropriate.  MEDICAL DECISION MAKING: Patient here with mechanical fall. Has obvious left femur deformity. Did hit her head. She is tachycardic and appears to be in sinus tachycardia. Denies chest pain or shortness of breath. No wheezing currently. Will treat with IV fluids, fentanyl. We'll keep her NPO.  We'll obtain CT of her head, cervical spine, chest x-ray, left hip, left femur and left knee x-rays. We'll obtain labs, urine.  ED PROGRESS: 1:00 AM  Pt's heart rate is now in the 110s still in sinus tachycardia. She is mildly hypertensive. Appears more comfortable. Again denies chest pain shortness of breath. X-ray confirms left distal femur fracture with shortening. Discussed with Dr.  Rush Farmer with orthopedics who recommends having orthopedic technician place patient and 10 pounds of traction. We will keep her NPO and he plans to take her to the operating room tomorrow afternoon. Discussed this with patient and her husband Fritz Pickerel. They agree with this plan. Labs unremarkable other than elevated bicarbonate which is chronic for her and likely secondary to chronic hypercarbia.  CT head and cervical spine show no acute abnormality.  Orthopedics requesting medicine admission.   1:10 AM  D/w Dr. Loleta Books with hospitalist service. He will discuss case with orthopedics to decide to the appropriate service to admit patient will be. She is currently hemodynamically stable. We'll give her dose of morphine prior to being placed in traction.     EKG Interpretation  Date/Time:  Saturday January 07 2016 23:46:37 EDT Ventricular Rate:  142 PR Interval:    QRS Duration: 83 QT Interval:  332 QTC Calculation: 511 R Axis:   -8 Text Interpretation:  Sinus tachycardia ST depression, probably rate related Minimal ST elevation, lateral leads Prolonged QT interval No significant change since last tracing other than rate is faster Confirmed by Bhavesh Vazquez,  DO, Dru Laurel ([id number]) on 01/08/2016 12:11:56 AM         I personally performed the services described in this documentation, which was scribed in my presence. The recorded information has been reviewed and is accurate.      [place], DO 01/08/16 [phone]

## 2016-01-08 ENCOUNTER — Emergency Department (HOSPITAL_COMMUNITY): Payer: Medicare HMO

## 2016-01-08 ENCOUNTER — Inpatient Hospital Stay (HOSPITAL_COMMUNITY): Payer: Medicare HMO

## 2016-01-08 ENCOUNTER — Encounter (HOSPITAL_COMMUNITY): Payer: Self-pay | Admitting: Family Medicine

## 2016-01-08 ENCOUNTER — Inpatient Hospital Stay (HOSPITAL_COMMUNITY): Payer: Medicare HMO | Admitting: Anesthesiology

## 2016-01-08 ENCOUNTER — Encounter (HOSPITAL_COMMUNITY)
Admission: EM | Disposition: A | Discharge status: Skilled Nursing Facility | Payer: Self-pay | Source: Home / Self Care | Attending: Internal Medicine

## 2016-01-08 DIAGNOSIS — J9601 Acute respiratory failure with hypoxia: Secondary | ICD-10-CM

## 2016-01-08 DIAGNOSIS — J438 Other emphysema: Secondary | ICD-10-CM

## 2016-01-08 DIAGNOSIS — J9621 Acute and chronic respiratory failure with hypoxia: Secondary | ICD-10-CM | POA: Diagnosis not present

## 2016-01-08 DIAGNOSIS — G4700 Insomnia, unspecified: Secondary | ICD-10-CM | POA: Diagnosis not present

## 2016-01-08 DIAGNOSIS — Z9981 Dependence on supplemental oxygen: Secondary | ICD-10-CM | POA: Diagnosis not present

## 2016-01-08 DIAGNOSIS — F319 Bipolar disorder, unspecified: Secondary | ICD-10-CM | POA: Diagnosis present

## 2016-01-08 DIAGNOSIS — Z955 Presence of coronary angioplasty implant and graft: Secondary | ICD-10-CM | POA: Diagnosis not present

## 2016-01-08 DIAGNOSIS — I2510 Atherosclerotic heart disease of native coronary artery without angina pectoris: Secondary | ICD-10-CM

## 2016-01-08 DIAGNOSIS — D62 Acute posthemorrhagic anemia: Secondary | ICD-10-CM | POA: Diagnosis not present

## 2016-01-08 DIAGNOSIS — F203 Undifferentiated schizophrenia: Secondary | ICD-10-CM | POA: Diagnosis not present

## 2016-01-08 DIAGNOSIS — Z90710 Acquired absence of both cervix and uterus: Secondary | ICD-10-CM | POA: Diagnosis not present

## 2016-01-08 DIAGNOSIS — R55 Syncope and collapse: Secondary | ICD-10-CM | POA: Diagnosis not present

## 2016-01-08 DIAGNOSIS — J449 Chronic obstructive pulmonary disease, unspecified: Secondary | ICD-10-CM | POA: Diagnosis present

## 2016-01-08 DIAGNOSIS — F17210 Nicotine dependence, cigarettes, uncomplicated: Secondary | ICD-10-CM | POA: Diagnosis not present

## 2016-01-08 DIAGNOSIS — Z7982 Long term (current) use of aspirin: Secondary | ICD-10-CM | POA: Diagnosis not present

## 2016-01-08 DIAGNOSIS — E876 Hypokalemia: Secondary | ICD-10-CM | POA: Diagnosis not present

## 2016-01-08 DIAGNOSIS — W1830XA Fall on same level, unspecified, initial encounter: Secondary | ICD-10-CM | POA: Diagnosis not present

## 2016-01-08 DIAGNOSIS — J9611 Chronic respiratory failure with hypoxia: Secondary | ICD-10-CM | POA: Diagnosis not present

## 2016-01-08 DIAGNOSIS — Z681 Body mass index (BMI) 19 or less, adult: Secondary | ICD-10-CM | POA: Diagnosis not present

## 2016-01-08 DIAGNOSIS — J9600 Acute respiratory failure, unspecified whether with hypoxia or hypercapnia: Secondary | ICD-10-CM | POA: Diagnosis present

## 2016-01-08 DIAGNOSIS — S72442A Displaced fracture of lower epiphysis (separation) of left femur, initial encounter for closed fracture: Secondary | ICD-10-CM | POA: Diagnosis not present

## 2016-01-08 DIAGNOSIS — D72829 Elevated white blood cell count, unspecified: Secondary | ICD-10-CM | POA: Diagnosis not present

## 2016-01-08 DIAGNOSIS — J9602 Acute respiratory failure with hypercapnia: Secondary | ICD-10-CM | POA: Diagnosis not present

## 2016-01-08 DIAGNOSIS — Z885 Allergy status to narcotic agent status: Secondary | ICD-10-CM | POA: Diagnosis not present

## 2016-01-08 DIAGNOSIS — E43 Unspecified severe protein-calorie malnutrition: Secondary | ICD-10-CM | POA: Diagnosis present

## 2016-01-08 DIAGNOSIS — S72352A Displaced comminuted fracture of shaft of left femur, initial encounter for closed fracture: Secondary | ICD-10-CM | POA: Diagnosis present

## 2016-01-08 DIAGNOSIS — Z23 Encounter for immunization: Secondary | ICD-10-CM | POA: Diagnosis not present

## 2016-01-08 DIAGNOSIS — I10 Essential (primary) hypertension: Secondary | ICD-10-CM | POA: Diagnosis not present

## 2016-01-08 DIAGNOSIS — R451 Restlessness and agitation: Secondary | ICD-10-CM | POA: Diagnosis not present

## 2016-01-08 DIAGNOSIS — Z79899 Other long term (current) drug therapy: Secondary | ICD-10-CM | POA: Diagnosis not present

## 2016-01-08 DIAGNOSIS — S72402A Unspecified fracture of lower end of left femur, initial encounter for closed fracture: Secondary | ICD-10-CM | POA: Diagnosis present

## 2016-01-08 DIAGNOSIS — Y92008 Other place in unspecified non-institutional (private) residence as the place of occurrence of the external cause: Secondary | ICD-10-CM | POA: Diagnosis not present

## 2016-01-08 DIAGNOSIS — I471 Supraventricular tachycardia: Secondary | ICD-10-CM | POA: Diagnosis not present

## 2016-01-08 DIAGNOSIS — J9622 Acute and chronic respiratory failure with hypercapnia: Secondary | ICD-10-CM | POA: Diagnosis not present

## 2016-01-08 HISTORY — PX: FEMUR IM NAIL: SHX1597

## 2016-01-08 LAB — CBC WITH DIFFERENTIAL/PLATELET
BASOS ABS: 0 10*3/uL (ref 0.0–0.1)
BASOS PCT: 0 %
Eosinophils Absolute: 0.2 10*3/uL (ref 0.0–0.7)
Eosinophils Relative: 2 %
HEMATOCRIT: 38.9 % (ref 36.0–46.0)
HEMOGLOBIN: 12.1 g/dL (ref 12.0–15.0)
LYMPHS PCT: 12 %
Lymphs Abs: 1.1 10*3/uL (ref 0.7–4.0)
MCH: 28.3 pg (ref 26.0–34.0)
MCHC: 31.1 g/dL (ref 30.0–36.0)
MCV: 91.1 fL (ref 78.0–100.0)
MONO ABS: 0.7 10*3/uL (ref 0.1–1.0)
MONOS PCT: 7 %
NEUTROS ABS: 7.5 10*3/uL (ref 1.7–7.7)
NEUTROS PCT: 79 %
Platelets: 280 10*3/uL (ref 150–400)
RBC: 4.27 MIL/uL (ref 3.87–5.11)
RDW: 14.1 % (ref 11.5–15.5)
WBC: 9.4 10*3/uL (ref 4.0–10.5)

## 2016-01-08 LAB — BLOOD GAS, ARTERIAL
ACID-BASE EXCESS: 7.9 mmol/L — AB (ref 0.0–2.0)
ACID-BASE EXCESS: 8 mmol/L — AB (ref 0.0–2.0)
Bicarbonate: 35.7 mEq/L — ABNORMAL HIGH (ref 20.0–24.0)
Bicarbonate: 37.1 mEq/L — ABNORMAL HIGH (ref 20.0–24.0)
DRAWN BY: 40415
Drawn by: 441371
EXPIRATORY PAP: 4
FIO2: 100
FIO2: 0.5
INSPIRATORY PAP: 14
MODE: POSITIVE
O2 SAT: 98.5 %
O2 SAT: 96.5 %
PATIENT TEMPERATURE: 98.6
PCO2 ART: 93.2 mmHg — AB (ref 35.0–45.0)
PH ART: 7.124 — AB (ref 7.350–7.450)
PO2 ART: 112 mmHg — AB (ref 80.0–100.0)
Patient temperature: 98.6
RATE: 15 resp/min
TCO2: 38.5 mmol/L (ref 0–100)
TCO2: 40.7 mmol/L (ref 0–100)
pH, Arterial: 7.207 — ABNORMAL LOW (ref 7.350–7.450)
pO2, Arterial: 187 mmHg — ABNORMAL HIGH (ref 80.0–100.0)

## 2016-01-08 LAB — CBC
HCT: 42.1 % (ref 36.0–46.0)
HEMOGLOBIN: 12.8 g/dL (ref 12.0–15.0)
MCH: 28.3 pg (ref 26.0–34.0)
MCHC: 30.4 g/dL (ref 30.0–36.0)
MCV: 93.1 fL (ref 78.0–100.0)
PLATELETS: 325 10*3/uL (ref 150–400)
RBC: 4.52 MIL/uL (ref 3.87–5.11)
RDW: 14 % (ref 11.5–15.5)
WBC: 15 10*3/uL — ABNORMAL HIGH (ref 4.0–10.5)

## 2016-01-08 LAB — BASIC METABOLIC PANEL
ANION GAP: 7 (ref 5–15)
Anion gap: 3 — ABNORMAL LOW (ref 5–15)
BUN: 11 mg/dL (ref 6–20)
BUN: 13 mg/dL (ref 6–20)
CALCIUM: 8.8 mg/dL — AB (ref 8.9–10.3)
CHLORIDE: 97 mmol/L — AB (ref 101–111)
CHLORIDE: 98 mmol/L — AB (ref 101–111)
CO2: 34 mmol/L — AB (ref 22–32)
CO2: 40 mmol/L — ABNORMAL HIGH (ref 22–32)
CREATININE: 0.48 mg/dL (ref 0.44–1.00)
Calcium: 9.1 mg/dL (ref 8.9–10.3)
Creatinine, Ser: 0.59 mg/dL (ref 0.44–1.00)
GFR calc Af Amer: >60 mL/min (ref >60)
GFR calc non Af Amer: >60 mL/min (ref >60)
GFR calc non Af Amer: >60 mL/min (ref >60)
GLUCOSE: 111 mg/dL — AB (ref 65–99)
Glucose, Bld: 174 mg/dL — ABNORMAL HIGH (ref 65–99)
Potassium: 3.5 mmol/L (ref 3.5–5.1)
Potassium: 3.8 mmol/L (ref 3.5–5.1)
SODIUM: 140 mmol/L (ref 135–145)
Sodium: 139 mmol/L (ref 135–145)

## 2016-01-08 LAB — GLUCOSE, CAPILLARY
GLUCOSE-CAPILLARY: 175 mg/dL — AB (ref 65–99)
GLUCOSE-CAPILLARY: 153 mg/dL — AB (ref 65–99)
GLUCOSE-CAPILLARY: 157 mg/dL — AB (ref 65–99)
GLUCOSE-CAPILLARY: 176 mg/dL — AB (ref 65–99)

## 2016-01-08 LAB — APTT: aPTT: 32 seconds (ref 24–37)

## 2016-01-08 LAB — POCT I-STAT 3, ART BLOOD GAS (G3+)
Acid-Base Excess: 11 mmol/L — ABNORMAL HIGH (ref 0.0–2.0)
BICARBONATE: 39 meq/L — AB (ref 20.0–24.0)
O2 Saturation: 100 %
PCO2 ART: 68.5 mmHg — AB (ref 35.0–45.0)
PO2 ART: 184 mmHg — AB (ref 80.0–100.0)
Patient temperature: 97.7
TCO2: 41 mmol/L (ref 0–100)
pH, Arterial: 7.361 (ref 7.350–7.450)

## 2016-01-08 LAB — PROTIME-INR
INR: 1.09 (ref 0.00–1.49)
PROTHROMBIN TIME: 14.3 s (ref 11.6–15.2)

## 2016-01-08 LAB — URINE MICROSCOPIC-ADD ON

## 2016-01-08 LAB — SURGICAL PCR SCREEN
MRSA, PCR: NEGATIVE
Staphylococcus aureus: POSITIVE — AB

## 2016-01-08 LAB — URINALYSIS, ROUTINE W REFLEX MICROSCOPIC
BILIRUBIN URINE: NEGATIVE
GLUCOSE, UA: NEGATIVE mg/dL
HGB URINE DIPSTICK: NEGATIVE
KETONES UR: NEGATIVE mg/dL
Leukocytes, UA: NEGATIVE
Nitrite: POSITIVE — AB
PH: 7.5 (ref 5.0–8.0)
PROTEIN: NEGATIVE mg/dL
Specific Gravity, Urine: 1.013 (ref 1.005–1.030)

## 2016-01-08 LAB — MRSA PCR SCREENING: MRSA BY PCR: NEGATIVE

## 2016-01-08 LAB — ABO/RH: ABO/RH(D): O POS

## 2016-01-08 LAB — TYPE AND SCREEN
ABO/RH(D): O POS
ANTIBODY SCREEN: NEGATIVE

## 2016-01-08 LAB — TROPONIN I

## 2016-01-08 SURGERY — INSERTION, INTRAMEDULLARY ROD, FEMUR, RETROGRADE
Anesthesia: General | Site: Leg Upper | Laterality: Left

## 2016-01-08 MED ORDER — RISPERIDONE 2 MG PO TABS
2.0000 mg | ORAL_TABLET | Freq: Every day | ORAL | Status: DC
  Administered 2016-01-08 – 2016-01-15 (×8): 2 mg
  Filled 2016-01-08 (×9): qty 1

## 2016-01-08 MED ORDER — PHENYLEPHRINE HCL 10 MG/ML IJ SOLN
INTRAMUSCULAR | Status: DC | PRN
  Administered 2016-01-08 (×2): 80 ug via INTRAVENOUS
  Administered 2016-01-08: 40 ug via INTRAVENOUS
  Administered 2016-01-08: 80 ug via INTRAVENOUS

## 2016-01-08 MED ORDER — 0.9 % SODIUM CHLORIDE (POUR BTL) OPTIME
TOPICAL | Status: DC | PRN
  Administered 2016-01-08: 1000 mL

## 2016-01-08 MED ORDER — BUSPIRONE HCL 5 MG PO TABS
10.0000 mg | ORAL_TABLET | Freq: Two times a day (BID) | ORAL | Status: DC
  Administered 2016-01-08 – 2016-01-16 (×16): 10 mg
  Filled 2016-01-08 (×4): qty 1
  Filled 2016-01-08: qty 2
  Filled 2016-01-08: qty 1
  Filled 2016-01-08: qty 2
  Filled 2016-01-08: qty 1
  Filled 2016-01-08 (×2): qty 2
  Filled 2016-01-08 (×2): qty 1
  Filled 2016-01-08: qty 2
  Filled 2016-01-08 (×4): qty 1

## 2016-01-08 MED ORDER — METHOCARBAMOL 1000 MG/10ML IJ SOLN
500.0000 mg | Freq: Four times a day (QID) | INTRAMUSCULAR | Status: DC | PRN
  Filled 2016-01-08: qty 5

## 2016-01-08 MED ORDER — FENTANYL CITRATE (PF) 100 MCG/2ML IJ SOLN
INTRAMUSCULAR | Status: DC | PRN
  Administered 2016-01-08 (×5): 50 ug via INTRAVENOUS

## 2016-01-08 MED ORDER — PHENOL 1.4 % MT LIQD: 1.0000 | OROMUCOSAL | Status: DC | PRN

## 2016-01-08 MED ORDER — ARFORMOTEROL TARTRATE 15 MCG/2ML IN NEBU
15.0000 ug | INHALATION_SOLUTION | Freq: Two times a day (BID) | RESPIRATORY_TRACT | Status: DC
  Administered 2016-01-08: 15 ug via RESPIRATORY_TRACT
  Filled 2016-01-08: qty 2

## 2016-01-08 MED ORDER — METHYLPREDNISOLONE SODIUM SUCC 125 MG IJ SOLR
80.0000 mg | Freq: Three times a day (TID) | INTRAMUSCULAR | Status: DC
  Administered 2016-01-08 – 2016-01-09 (×3): 80 mg via INTRAVENOUS
  Filled 2016-01-08 (×3): qty 2

## 2016-01-08 MED ORDER — FENTANYL CITRATE (PF) 100 MCG/2ML IJ SOLN: 50.0000 ug | INTRAMUSCULAR | Status: DC | PRN

## 2016-01-08 MED ORDER — PHENYLEPHRINE HCL 10 MG/ML IJ SOLN
10.0000 mg | INTRAVENOUS | Status: DC | PRN
  Administered 2016-01-08: 25 ug/min via INTRAVENOUS

## 2016-01-08 MED ORDER — BENZTROPINE MESYLATE 1 MG PO TABS
0.5000 mg | ORAL_TABLET | Freq: Every day | ORAL | Status: DC
  Administered 2016-01-08 – 2016-01-15 (×8): 0.5 mg
  Filled 2016-01-08 (×8): qty 1

## 2016-01-08 MED ORDER — SODIUM CHLORIDE 0.9 % IV SOLN
INTRAVENOUS | Status: DC
Start: 2016-01-08 — End: 2016-01-16
  Administered 2016-01-08: 1000 mL via INTRAVENOUS
  Administered 2016-01-09 – 2016-01-14 (×6): via INTRAVENOUS
  Administered 2016-01-15: 50 mL/h via INTRAVENOUS

## 2016-01-08 MED ORDER — MORPHINE SULFATE (PF) 4 MG/ML IV SOLN
4.0000 mg | Freq: Once | INTRAVENOUS | Status: AC
  Administered 2016-01-08: 4 mg via INTRAVENOUS
  Filled 2016-01-08: qty 1

## 2016-01-08 MED ORDER — LAMOTRIGINE 100 MG PO TABS
100.0000 mg | ORAL_TABLET | Freq: Every day | ORAL | Status: DC
  Administered 2016-01-10 – 2016-01-16 (×7): 100 mg
  Filled 2016-01-08 (×9): qty 1

## 2016-01-08 MED ORDER — FENTANYL BOLUS VIA INFUSION
25.0000 ug | INTRAVENOUS | Status: DC | PRN
  Filled 2016-01-08: qty 25

## 2016-01-08 MED ORDER — CEFAZOLIN SODIUM-DEXTROSE 2-4 GM/100ML-% IV SOLN
2.0000 g | Freq: Four times a day (QID) | INTRAVENOUS | Status: AC
  Administered 2016-01-08 – 2016-01-09 (×2): 2 g via INTRAVENOUS
  Filled 2016-01-08 (×2): qty 100

## 2016-01-08 MED ORDER — BUSPIRONE HCL 10 MG PO TABS
10.0000 mg | ORAL_TABLET | Freq: Two times a day (BID) | ORAL | Status: DC
  Filled 2016-01-08: qty 1

## 2016-01-08 MED ORDER — PROPOFOL 500 MG/50ML IV EMUL
INTRAVENOUS | Status: DC | PRN
  Administered 2016-01-08: 75 ug/kg/min via INTRAVENOUS

## 2016-01-08 MED ORDER — LAMOTRIGINE 100 MG PO TABS
100.0000 mg | ORAL_TABLET | Freq: Every day | ORAL | Status: DC
  Filled 2016-01-08: qty 1

## 2016-01-08 MED ORDER — TRAZODONE HCL 50 MG PO TABS
50.0000 mg | ORAL_TABLET | Freq: Every day | ORAL | Status: DC
  Administered 2016-01-08 – 2016-01-15 (×8): 50 mg
  Filled 2016-01-08 (×8): qty 1

## 2016-01-08 MED ORDER — NALOXONE HCL 2 MG/2ML IJ SOSY: 1.0000 mg | PREFILLED_SYRINGE | INTRAMUSCULAR | Status: DC | PRN

## 2016-01-08 MED ORDER — NALOXONE HCL 0.4 MG/ML IJ SOLN: 0.4000 mg | Freq: Once | INTRAMUSCULAR | Status: AC

## 2016-01-08 MED ORDER — MIDAZOLAM HCL 5 MG/5ML IJ SOLN
INTRAMUSCULAR | Status: DC | PRN
  Administered 2016-01-08: 2 mg via INTRAVENOUS

## 2016-01-08 MED ORDER — CETYLPYRIDINIUM CHLORIDE 0.05 % MT LIQD
7.0000 mL | Freq: Two times a day (BID) | OROMUCOSAL | Status: DC
  Administered 2016-01-09 – 2016-01-16 (×14): 7 mL via OROMUCOSAL

## 2016-01-08 MED ORDER — ONDANSETRON HCL 4 MG/2ML IJ SOLN
4.0000 mg | Freq: Once | INTRAMUSCULAR | Status: AC
  Administered 2016-01-08: 4 mg via INTRAVENOUS
  Filled 2016-01-08: qty 2

## 2016-01-08 MED ORDER — ACETAMINOPHEN 325 MG PO TABS
650.0000 mg | ORAL_TABLET | Freq: Four times a day (QID) | ORAL | Status: DC | PRN
  Administered 2016-01-11 – 2016-01-15 (×2): 650 mg via ORAL
  Filled 2016-01-08 (×2): qty 2

## 2016-01-08 MED ORDER — PHENYLEPHRINE 40 MCG/ML (10ML) SYRINGE FOR IV PUSH (FOR BLOOD PRESSURE SUPPORT)
PREFILLED_SYRINGE | INTRAVENOUS | Status: AC
  Filled 2016-01-08: qty 10

## 2016-01-08 MED ORDER — METHYLPREDNISOLONE SODIUM SUCC 125 MG IJ SOLR: 80.0000 mg | Freq: Four times a day (QID) | INTRAMUSCULAR | Status: DC

## 2016-01-08 MED ORDER — PROPOFOL 10 MG/ML IV BOLUS
INTRAVENOUS | Status: AC
  Filled 2016-01-08: qty 20

## 2016-01-08 MED ORDER — INSULIN ASPART 100 UNIT/ML ~~LOC~~ SOLN
0.0000 [IU] | SUBCUTANEOUS | Status: DC
  Administered 2016-01-08 – 2016-01-09 (×4): 4 [IU] via SUBCUTANEOUS
  Administered 2016-01-09: 3 [IU] via SUBCUTANEOUS
  Administered 2016-01-09: 4 [IU] via SUBCUTANEOUS
  Administered 2016-01-10 (×2): 3 [IU] via SUBCUTANEOUS
  Administered 2016-01-11: 4 [IU] via SUBCUTANEOUS
  Administered 2016-01-11: 3 [IU] via SUBCUTANEOUS
  Administered 2016-01-12: 4 [IU] via SUBCUTANEOUS
  Administered 2016-01-12: 3 [IU] via SUBCUTANEOUS
  Administered 2016-01-13 (×2): 4 [IU] via SUBCUTANEOUS
  Administered 2016-01-14 (×3): 3 [IU] via SUBCUTANEOUS
  Administered 2016-01-15 (×2): 4 [IU] via SUBCUTANEOUS
  Administered 2016-01-15: 3 [IU] via SUBCUTANEOUS

## 2016-01-08 MED ORDER — POVIDONE-IODINE 10 % EX SWAB: 2.0000 "application " | Freq: Once | CUTANEOUS | Status: DC

## 2016-01-08 MED ORDER — CHLORHEXIDINE GLUCONATE 0.12 % MT SOLN
15.0000 mL | Freq: Two times a day (BID) | OROMUCOSAL | Status: DC
  Administered 2016-01-08 – 2016-01-10 (×5): 15 mL via OROMUCOSAL
  Filled 2016-01-08 (×2): qty 15

## 2016-01-08 MED ORDER — ONDANSETRON HCL 4 MG/2ML IJ SOLN
INTRAMUSCULAR | Status: AC
  Filled 2016-01-08: qty 2

## 2016-01-08 MED ORDER — PROPOFOL 500 MG/50ML IV EMUL: INTRAVENOUS | Status: DC | PRN

## 2016-01-08 MED ORDER — AMLODIPINE BESYLATE 5 MG PO TABS: 5.0000 mg | ORAL_TABLET | Freq: Every day | ORAL | Status: DC

## 2016-01-08 MED ORDER — METOCLOPRAMIDE HCL 5 MG/ML IJ SOLN: 5.0000 mg | Freq: Three times a day (TID) | INTRAMUSCULAR | Status: DC | PRN

## 2016-01-08 MED ORDER — BUDESONIDE 0.25 MG/2ML IN SUSP
0.2500 mg | Freq: Two times a day (BID) | RESPIRATORY_TRACT | Status: DC
  Administered 2016-01-08 – 2016-01-10 (×5): 0.25 mg via RESPIRATORY_TRACT
  Filled 2016-01-08 (×5): qty 2

## 2016-01-08 MED ORDER — HYDROCODONE-ACETAMINOPHEN 5-325 MG PO TABS: 1.0000 | ORAL_TABLET | Freq: Four times a day (QID) | ORAL | Status: DC | PRN

## 2016-01-08 MED ORDER — HYDROMORPHONE HCL 1 MG/ML IJ SOLN
0.5000 mg | INTRAMUSCULAR | Status: DC | PRN
Start: 2016-01-08 — End: 2016-01-08

## 2016-01-08 MED ORDER — ASPIRIN EC 81 MG PO TBEC: 81.0000 mg | DELAYED_RELEASE_TABLET | Freq: Every day | ORAL | Status: DC

## 2016-01-08 MED ORDER — LACTATED RINGERS IV SOLN
INTRAVENOUS | Status: DC | PRN
  Administered 2016-01-08 (×2): via INTRAVENOUS

## 2016-01-08 MED ORDER — METOCLOPRAMIDE HCL 10 MG PO TABS: 5.0000 mg | ORAL_TABLET | Freq: Three times a day (TID) | ORAL | Status: DC | PRN

## 2016-01-08 MED ORDER — NALOXONE HCL 0.4 MG/ML IJ SOLN
INTRAMUSCULAR | Status: AC
  Filled 2016-01-08: qty 3

## 2016-01-08 MED ORDER — METHOCARBAMOL 500 MG PO TABS
500.0000 mg | ORAL_TABLET | Freq: Four times a day (QID) | ORAL | Status: DC | PRN
  Administered 2016-01-09 – 2016-01-12 (×3): 500 mg via ORAL
  Filled 2016-01-08 (×4): qty 1

## 2016-01-08 MED ORDER — FENTANYL CITRATE (PF) 100 MCG/2ML IJ SOLN: 25.0000 ug | INTRAMUSCULAR | Status: DC | PRN

## 2016-01-08 MED ORDER — SODIUM CHLORIDE 0.9 % IV SOLN
25.0000 ug/h | INTRAVENOUS | Status: DC
  Administered 2016-01-08: 25 ug/h via INTRAVENOUS
  Filled 2016-01-08: qty 50

## 2016-01-08 MED ORDER — NYSTATIN 100000 UNIT/GM EX CREA
TOPICAL_CREAM | Freq: Once | CUTANEOUS | Status: DC
  Filled 2016-01-08: qty 15

## 2016-01-08 MED ORDER — MIDAZOLAM HCL 2 MG/2ML IJ SOLN
1.0000 mg | INTRAMUSCULAR | Status: DC | PRN
  Filled 2016-01-08: qty 2

## 2016-01-08 MED ORDER — FLUOXETINE HCL 20 MG PO CAPS
40.0000 mg | ORAL_CAPSULE | Freq: Every morning | ORAL | Status: DC
  Administered 2016-01-09 – 2016-01-16 (×8): 40 mg
  Filled 2016-01-08 (×8): qty 2

## 2016-01-08 MED ORDER — FENTANYL CITRATE (PF) 100 MCG/2ML IJ SOLN
50.0000 ug | Freq: Once | INTRAMUSCULAR | Status: AC
  Administered 2016-01-08: 50 ug via INTRAVENOUS
  Filled 2016-01-08: qty 2

## 2016-01-08 MED ORDER — BUPIVACAINE-EPINEPHRINE (PF) 0.5% -1:200000 IJ SOLN
INTRAMUSCULAR | Status: AC
Start: 2016-01-08 — End: 2016-01-08
  Filled 2016-01-08: qty 30

## 2016-01-08 MED ORDER — TRAZODONE HCL 50 MG PO TABS: 50.0000 mg | ORAL_TABLET | Freq: Every day | ORAL | Status: DC

## 2016-01-08 MED ORDER — ONDANSETRON HCL 4 MG PO TABS
4.0000 mg | ORAL_TABLET | Freq: Four times a day (QID) | ORAL | Status: DC | PRN
  Filled 2016-01-08: qty 1

## 2016-01-08 MED ORDER — POLYETHYLENE GLYCOL 3350 17 G PO PACK
17.0000 g | PACK | Freq: Every day | ORAL | Status: DC | PRN
  Administered 2016-01-14: 17 g via ORAL
  Filled 2016-01-08: qty 1

## 2016-01-08 MED ORDER — ASPIRIN EC 325 MG PO TBEC
325.0000 mg | DELAYED_RELEASE_TABLET | Freq: Every day | ORAL | Status: DC
  Administered 2016-01-09 – 2016-01-16 (×8): 325 mg via ORAL
  Filled 2016-01-08 (×8): qty 1

## 2016-01-08 MED ORDER — PANTOPRAZOLE SODIUM 40 MG PO PACK
40.0000 mg | PACK | Freq: Every day | ORAL | Status: DC
  Administered 2016-01-08 – 2016-01-16 (×8): 40 mg
  Filled 2016-01-08 (×9): qty 20

## 2016-01-08 MED ORDER — FLUOXETINE HCL 20 MG PO CAPS: 40.0000 mg | ORAL_CAPSULE | Freq: Every morning | ORAL | Status: DC

## 2016-01-08 MED ORDER — NALOXONE HCL 0.4 MG/ML IJ SOLN
INTRAMUSCULAR | Status: AC
Start: 2016-01-08 — End: 2016-01-08
  Filled 2016-01-08: qty 1

## 2016-01-08 MED ORDER — CEFAZOLIN SODIUM-DEXTROSE 2-4 GM/100ML-% IV SOLN
INTRAVENOUS | Status: AC
Start: 2016-01-08 — End: 2016-01-08
  Filled 2016-01-08: qty 100

## 2016-01-08 MED ORDER — MENTHOL 3 MG MT LOZG: 1.0000 | LOZENGE | OROMUCOSAL | Status: DC | PRN

## 2016-01-08 MED ORDER — ROCURONIUM BROMIDE 50 MG/5ML IV SOLN
INTRAVENOUS | Status: AC
  Filled 2016-01-08: qty 1

## 2016-01-08 MED ORDER — MIDAZOLAM HCL 2 MG/2ML IJ SOLN
INTRAMUSCULAR | Status: AC
  Filled 2016-01-08: qty 2

## 2016-01-08 MED ORDER — BISACODYL 10 MG RE SUPP: 10.0000 mg | Freq: Every day | RECTAL | Status: DC | PRN

## 2016-01-08 MED ORDER — ACETAMINOPHEN 650 MG RE SUPP: 650.0000 mg | Freq: Four times a day (QID) | RECTAL | Status: DC | PRN

## 2016-01-08 MED ORDER — NALOXONE HCL 0.4 MG/ML IJ SOLN
INTRAMUSCULAR | Status: AC
  Administered 2016-01-08 (×2): 0.4 mg
  Filled 2016-01-08: qty 1

## 2016-01-08 MED ORDER — MIDAZOLAM HCL 2 MG/2ML IJ SOLN
1.0000 mg | INTRAMUSCULAR | Status: DC | PRN
  Administered 2016-01-08: 1 mg via INTRAVENOUS

## 2016-01-08 MED ORDER — IPRATROPIUM-ALBUTEROL 0.5-2.5 (3) MG/3ML IN SOLN
3.0000 mL | Freq: Four times a day (QID) | RESPIRATORY_TRACT | Status: DC
  Administered 2016-01-08 – 2016-01-12 (×16): 3 mL via RESPIRATORY_TRACT
  Filled 2016-01-08 (×17): qty 3

## 2016-01-08 MED ORDER — CHLORHEXIDINE GLUCONATE 4 % EX LIQD
60.0000 mL | Freq: Once | CUTANEOUS | Status: DC
  Filled 2016-01-08: qty 60

## 2016-01-08 MED ORDER — BENZTROPINE MESYLATE 0.5 MG PO TABS: 0.5000 mg | ORAL_TABLET | Freq: Every day | ORAL | Status: DC

## 2016-01-08 MED ORDER — PROPOFOL 1000 MG/100ML IV EMUL
INTRAVENOUS | Status: AC
  Filled 2016-01-08: qty 100

## 2016-01-08 MED ORDER — DOCUSATE SODIUM 100 MG PO CAPS
100.0000 mg | ORAL_CAPSULE | Freq: Two times a day (BID) | ORAL | Status: DC
  Administered 2016-01-09 – 2016-01-16 (×14): 100 mg via ORAL
  Filled 2016-01-08 (×14): qty 1

## 2016-01-08 MED ORDER — ONDANSETRON HCL 4 MG/2ML IJ SOLN: 4.0000 mg | Freq: Four times a day (QID) | INTRAMUSCULAR | Status: DC | PRN

## 2016-01-08 MED ORDER — RISPERIDONE 2 MG PO TABS: 2.0000 mg | ORAL_TABLET | Freq: Every day | ORAL | Status: DC

## 2016-01-08 MED ORDER — CEFAZOLIN SODIUM-DEXTROSE 2-4 GM/100ML-% IV SOLN
2.0000 g | INTRAVENOUS | Status: DC
  Filled 2016-01-08: qty 100

## 2016-01-08 MED ORDER — ETOMIDATE 2 MG/ML IV SOLN
20.0000 mg/kg | Freq: Once | INTRAVENOUS | Status: AC
  Administered 2016-01-08: 20 mg via INTRAVENOUS

## 2016-01-08 MED ORDER — FENTANYL CITRATE (PF) 250 MCG/5ML IJ SOLN
INTRAMUSCULAR | Status: AC
  Filled 2016-01-08: qty 5

## 2016-01-08 SURGICAL SUPPLY — 73 items
BANDAGE ACE 6X5 VEL STRL LF (GAUZE/BANDAGES/DRESSINGS) ×3 IMPLANT
BANDAGE ELASTIC 4 VELCRO ST LF (GAUZE/BANDAGES/DRESSINGS) ×3 IMPLANT
BANDAGE ELASTIC 6 VELCRO ST LF (GAUZE/BANDAGES/DRESSINGS) ×3 IMPLANT
BANDAGE ESMARK 6X9 LF (GAUZE/BANDAGES/DRESSINGS) IMPLANT
BIT DRILL CALIBRATED 4.3MMX365 (DRILL) IMPLANT
BIT DRILL CROWE PNT TWST 4.5MM (DRILL) IMPLANT
BLADE SURG 15 STRL LF DISP TIS (BLADE) ×1 IMPLANT
BLADE SURG 15 STRL SS (BLADE) ×3
BLADE SURG ROTATE 9660 (MISCELLANEOUS) IMPLANT
BNDG CMPR 9X6 STRL LF SNTH (GAUZE/BANDAGES/DRESSINGS)
BNDG COHESIVE 6X5 TAN STRL LF (GAUZE/BANDAGES/DRESSINGS) ×3 IMPLANT
BNDG ESMARK 6X9 LF (GAUZE/BANDAGES/DRESSINGS)
BNDG GAUZE ELAST 4 BULKY (GAUZE/BANDAGES/DRESSINGS) ×3 IMPLANT
CUFF TOURNIQUET SINGLE 34IN LL (TOURNIQUET CUFF) IMPLANT
CUFF TOURNIQUET SINGLE 44IN (TOURNIQUET CUFF) IMPLANT
DRAPE C-ARM 42X72 X-RAY (DRAPES) ×3 IMPLANT
DRAPE IMP U-DRAPE 54X76 (DRAPES) ×6 IMPLANT
DRAPE INCISE IOBAN 66X45 STRL (DRAPES) IMPLANT
DRAPE ORTHO SPLIT 77X108 STRL (DRAPES) ×6
DRAPE PROXIMA HALF (DRAPES) ×6 IMPLANT
DRAPE SURG 17X23 STRL (DRAPES) ×3 IMPLANT
DRAPE SURG ORHT 6 SPLT 77X108 (DRAPES) ×2 IMPLANT
DRAPE U-SHAPE 47X51 STRL (DRAPES) ×6 IMPLANT
DRESSING OPSITE X SMALL 2X3 (GAUZE/BANDAGES/DRESSINGS) ×3 IMPLANT
DRILL CALIBRATED 4.3MMX365 (DRILL) ×3
DRILL CROWE POINT TWIST 4.5MM (DRILL) ×3
DRSG MEPILEX BORDER 4X4 (GAUZE/BANDAGES/DRESSINGS) ×2 IMPLANT
DRSG PAD ABDOMINAL 8X10 ST (GAUZE/BANDAGES/DRESSINGS) ×2 IMPLANT
DURAPREP 26ML APPLICATOR (WOUND CARE) ×3 IMPLANT
ELECT REM PT RETURN 9FT ADLT (ELECTROSURGICAL) ×3
ELECTRODE REM PT RTRN 9FT ADLT (ELECTROSURGICAL) ×1 IMPLANT
FACESHIELD WRAPAROUND (MASK) ×3 IMPLANT
GAUZE SPONGE 4X4 12PLY STRL (GAUZE/BANDAGES/DRESSINGS) ×4 IMPLANT
GAUZE XEROFORM 1X8 LF (GAUZE/BANDAGES/DRESSINGS) ×3 IMPLANT
GAUZE XEROFORM 5X9 LF (GAUZE/BANDAGES/DRESSINGS) ×2 IMPLANT
GLOVE BIO SURGEON STRL SZ8 (GLOVE) ×3 IMPLANT
GLOVE BIOGEL PI IND STRL 8 (GLOVE) ×1 IMPLANT
GLOVE BIOGEL PI INDICATOR 8 (GLOVE) ×2
GLOVE ORTHO TXT STRL SZ7.5 (GLOVE) ×3 IMPLANT
GOWN STRL REUS W/ TWL LRG LVL3 (GOWN DISPOSABLE) ×2 IMPLANT
GOWN STRL REUS W/ TWL XL LVL3 (GOWN DISPOSABLE) ×4 IMPLANT
GOWN STRL REUS W/TWL LRG LVL3 (GOWN DISPOSABLE) ×6
GOWN STRL REUS W/TWL XL LVL3 (GOWN DISPOSABLE) ×12
GUIDEPIN 3.2X17.5 THRD DISP (PIN) ×2 IMPLANT
GUIDEWIRE BEAD TIP (WIRE) ×2 IMPLANT
KIT BASIN OR (CUSTOM PROCEDURE TRAY) ×3 IMPLANT
KIT ROOM TURNOVER OR (KITS) ×3 IMPLANT
MANIFOLD NEPTUNE II (INSTRUMENTS) ×3 IMPLANT
NAIL FEM RETRO 13.5X380 (Nail) ×2 IMPLANT
NEEDLE 22X1 1/2 (OR ONLY) (NEEDLE) ×3 IMPLANT
NS IRRIG 1000ML POUR BTL (IV SOLUTION) ×3 IMPLANT
PACK GENERAL/GYN (CUSTOM PROCEDURE TRAY) ×3 IMPLANT
PACK UNIVERSAL I (CUSTOM PROCEDURE TRAY) ×3 IMPLANT
PAD ARMBOARD 7.5X6 YLW CONV (MISCELLANEOUS) ×6 IMPLANT
PAD CAST 4YDX4 CTTN HI CHSV (CAST SUPPLIES) ×1 IMPLANT
PADDING CAST ABS 4INX4YD NS (CAST SUPPLIES) ×2
PADDING CAST ABS COTTON 4X4 ST (CAST SUPPLIES) IMPLANT
PADDING CAST COTTON 4X4 STRL (CAST SUPPLIES) ×3
SCREW CORT TI DBL LEAD 5X34 (Screw) ×2 IMPLANT
SCREW CORT TI DBL LEAD 5X70 (Screw) ×2 IMPLANT
SCREW CORT TI DBL LEAD 5X80 (Screw) ×2 IMPLANT
SCREW CORT TI DBLE LEAD 5X54 (Screw) ×2 IMPLANT
STAPLER VISISTAT 35W (STAPLE) ×3 IMPLANT
STOCKINETTE IMPERVIOUS LG (DRAPES) ×3 IMPLANT
SUT VIC AB 0 CT1 27 (SUTURE) ×6
SUT VIC AB 0 CT1 27XBRD ANBCTR (SUTURE) IMPLANT
SUT VIC AB 0 CTB1 27 (SUTURE) ×3 IMPLANT
SUT VIC AB 2-0 CT1 27 (SUTURE) ×6
SUT VIC AB 2-0 CT1 TAPERPNT 27 (SUTURE) ×2 IMPLANT
SYR CONTROL 10ML LL (SYRINGE) ×3 IMPLANT
TOWEL OR 17X24 6PK STRL BLUE (TOWEL DISPOSABLE) ×3 IMPLANT
TOWEL OR 17X26 10 PK STRL BLUE (TOWEL DISPOSABLE) ×3 IMPLANT
WATER STERILE IRR 1000ML POUR (IV SOLUTION) ×3 IMPLANT

## 2016-01-08 NOTE — Progress Notes (Signed)
Patient was admitted earlier during the day by my colleague, chart, labs and imaging were reviewed, patient obtunded this a.m. secondary to hypercapnic respiratory failure, quadrant stat intubation by PCCM, care was transferred to [facility]. Son was called and updated about patient condition prior to intubation, agrees to intubation and transfer to ICU. Phillips Climes MD

## 2016-01-08 NOTE — Anesthesia Preprocedure Evaluation (Addendum)
Anesthesia Evaluation  Patient identified by MRN, date of birth, ID band Patient unresponsive  General Assessment Comment:Pt intubated and sedated  Reviewed: Allergy & Precautions, NPO status , Patient's Chart, lab work & pertinent test results, Unable to perform ROS - Chart review only  History of Anesthesia Complications Negative for: history of anesthetic complications  Airway Mallampati: Intubated       Dental   Pulmonary shortness of breath, at rest and Long-Term Oxygen Therapy, COPD,  COPD inhaler and oxygen dependent, Current Smoker,  Pt intubated this morning: somnolent with elevated pCO2 from resp depression of pain meds with O2 dependent COPD   breath sounds clear to auscultation       Cardiovascular hypertension, + CAD, + Cardiac Stents and + Peripheral Vascular Disease   Rhythm:Regular Rate:Normal  '15 ECHO: EF 55-60%, valves OK '13 stress: normal perfusion, EF 65%   Neuro/Psych Anxiety Depression Bipolar Disorder Schizophrenia    GI/Hepatic negative GI ROS, Neg liver ROS,   Endo/Other  negative endocrine ROS  Renal/GU negative Renal ROS     Musculoskeletal  (+) Arthritis , Osteoarthritis,    Abdominal   Peds  Hematology negative hematology ROS (+)   Anesthesia Other Findings   Reproductive/Obstetrics                        Anesthesia Physical Anesthesia Plan  ASA: III  Anesthesia Plan: General   Post-op Pain Management:    Induction: Inhalational  Airway Management Planned: Oral ETT  Additional Equipment:   Intra-op Plan:   Post-operative Plan: Post-operative intubation/ventilation  Informed Consent: I have reviewed the patients History and Physical, chart, labs and discussed the procedure including the risks, benefits and alternatives for the proposed anesthesia with the patient or authorized representative who has indicated his/her understanding and acceptance.    History available from chart only  Plan Discussed with: CRNA and Surgeon  Anesthesia Plan Comments: (Plan routine monitors, GETA with existing ETT, post op ventilation)        Anesthesia Quick Evaluation
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
## 2016-01-08 NOTE — Progress Notes (Signed)
[facility] hospitalist progress note. Chief complaint. Lethargy, hypoxia. History of present illness. This 70 year old female admitted with altered mental status found on the floor by her husband. She was found to have a femoral fracture and was seen in consult by orthopedics. She has a baseline history of tobacco use and COPD. When she arrived to the floor she was found to be very lethargic and having desaturations into the 80s. Rapid response was called to the bedside and felt the patient may have had 2 large of morphine dose earlier. She was given a dose of Narcan and I was notified and came to the patient's bedside as well. By the time I came I found her alert but nonverbal. Respiratory therapy obtain an arterial blood gas. This shows pH 7.207, PCO2 93.2, PO2 187, bicarbonate 35.7. In blood glucose was obtained and found to be 175. Physical exam. Vital signs. Temperature 98.5, pulse 111, respiration 14, blood pressure 118/61. O2 sats 100%. General appearance. Frail elderly female who is now alert but quickly returns to sleep when not stimulated. Cardiac. Regular and tachycardic. Lungs. Breath sounds are clear but reduced in the bases bilaterally. Abdomen. Soft with positive bowel sounds. Neurologic. Cranial nerves II-12 grossly intact. No unilateral or focal defects. Impression/plan. Problem #1. Altered level of consciousness. I suspect this secondary to IV morphine. Patient much improved status post Narcan. Still somnolent will repeat dose of Narcan and follow. No neurologic changes to suggest an acute stroke. Problem #2. CO2 retention. I suspect this multifactorial with COPD and chronic CO2 retention along with IV narcotics. Current CO2 quite high 93.2 with patient Baseline CO2 in the upper 80/low 70 range. We'll place to step down on BiPAP until CO2 normalizes and at that point I suspect she can go back to nasal cannula oxygen.

## 2016-01-08 NOTE — ED Notes (Signed)
Dr. Danford at bedside  

## 2016-01-08 NOTE — Progress Notes (Signed)
Patient taken off vent, to be transported to OR by CRNA with an ambu bag and O2 tank.

## 2016-01-08 NOTE — Op Note (Signed)
NAME:  Caroline Hill, Caroline Hill                 ACCOUNT NO.:  [id number]  MEDICAL RECORD NO.:  [id number]  LOCATION:  [facility]                         FACILITY:  [facility]  PHYSICIAN:  Lind Guest. Ninfa Linden, M.D.DATE OF BIRTH:  01/05/1946  DATE OF PROCEDURE:  01/07/2016 DATE OF DISCHARGE:                              OPERATIVE REPORT   PREOPERATIVE DIAGNOSIS:  Left distal third comminuted femoral shaft fracture.  POSTOPERATIVE DIAGNOSIS:  Left distal third comminuted femoral shaft fracture.  PROCEDURE:  Retrograde intramedullary nail placement, left femur.  IMPLANTS:  Biomet 13.5 x 380 femoral nail with 1 proximal and 3 distal interlocks.  SURGEON:  Lind Guest. Ninfa Linden, M.D.  ASSISTANT:  Erskine Emery, PA-C.  ANESTHESIA:  General.  BLOOD LOSS:  200 mL.  ANTIBIOTICS:  2 g IV Ancef.  COMPLICATIONS:  None.  INDICATIONS:  Ms. Thomaston is a 70 year old female patient with severe COPD and several medical problems.  She sustained a mechanical fall at home. She had a fall earlier and had developed some pain and then she had another fall and this time, she has sustained a left distal third comminuted femoral shaft fracture.  Her bone is quite soft and she was taken to emergency room with severe amount of pain in her left thigh. She was admitted to the Medicine Service quite tachycardic and developed some tachypnea.  She was placed in [place] traction overnight and transported to the ICU due to difficulty breathing and some somnolence, likely related to pain medicine and her COPD as well.  She was seen by pulmonary specialist and Critical Care and intubated, and they felt that we should proceed with surgery as this is more of a pain control issue. Informed consent was obtained from her husband and family.  I explained to them the risk of acute blood loss anemia, nerve and vessel injury, nonunion, DVT, and even death.  Our goals are decreased pain, improved mobility, and overall improved quality of  life.  PROCEDURE DESCRIPTION:  After informed consent was obtained, appropriate left femur was marked.  She was brought straight back from the ICU to the operating room.  She was then placed supine on the operating flat [place] table.  Her left thigh, knee, and upper leg were prepped and draped with DuraPrep and sterile drapes including sterile stockinette. A time-out was called.  She was identified as correct patient and correct left femur fracture.  Under direct fluoroscopic guidance, I tried several attempts to reduce the fracture but I found this to be quite difficult even with several different types of bumps and significant traction but she was small, and we kept pulling her down the bed.  I decided to open up at the fracture site first though opened up at the knee and I did make an incision through the patellar tendon and down to the intercondylar area.  Under direct fluoroscopic guidance, I placed a temporary guide pin at the distal femur at [place] line.  I then used initiating reamer up the femoral canal and fracture reducer to reduce the fracture as best I could.  It was still significantly angulated and distracted and with her soft bone and a large wide open canal, felt like  we needed to have more of an anatomic reduction.  I then made an incision off the anterolateral femur and dissected down the fracture site.  I was able to put several clamps in place and reduced the fracture.  We then placed a temporary guide pin up the femoral canal and used sequential reaming to ream out the femoral canal going all the way up to a size 15.  We took a measurement off that as well and then we chose a 13.5 x 380 retrograde Biomet femoral nail.  We placed this nail without difficulty with the guidewire and removed the guidewire. Through separate lateral incisions, we then placed 3 distal interlocking screws and then made this a fixed angle construct from the distal aspect of the femoral  nail. I then made an anterior to posterior incision at the proximal aspect of the nail and placed a single anterior to posterior screw.  All this again was accomplished under direct fluoroscopy as well.  We then irrigated all wounds with normal saline solution, closed the deep tissue with 0 Vicryl, followed by 2-0 Vicryl for subcutaneous tissue, and staples on all skin incisions including closing the arthrotomy with interrupted #1 Vicryl suture.  Xeroform and well-padded sterile dressing was applied.  She was taken off the flat [place] table and back to the ICU, intubated but stable.  Of note, Erskine Emery, PA-C assisted in the entire case.  His assistance was definitely crucial for facilitating all aspects of this case.     Lind Guest. Ninfa Linden, M.D.     CYB/MEDQ  D:  01/08/2016  T:  01/08/2016  Job:  [id number]
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
## 2016-01-08 NOTE — Progress Notes (Signed)
Asked to see patient while rounding on floor this morning. Per floor RN Pt just arrived from ED after receiving 4 mg of morphine, very lethargic. RT at bedside at 0415, Pt sats dropped to 60%. Patient placed on NRB per RT with 02 sats improved to 100% . Upon walking into room at 0453 Pt found agonal breathing, with an occasional snoring/grunting sounds. Very pale and diaphoretic, unresponsive. HR 130s, BP 180/89, RR 6, Po2 100%. Narcan given STAT. [facility] NP L. [place] paged and updated,  RT called to bedside , ABG drawn STAT. Fredirick Maudlin NP to bedside to assess at 0510, Pt minimally responsive at that time. CBG 175, rectal temp 98.5. Additional dose narcan given at 0523. Pt more responsive, opens eyes to voice, follows simple commands but falls asleep easily. ABG resulted pH 7.207, PCO2 93.2, PO2 187, bicarbonate 35.7. Pt for transfer to SDU for closer monitoring and biapap. Transferred to room  2H18 and placed on bipap on arrival. Care assumed per SDU RN.

## 2016-01-08 NOTE — Progress Notes (Signed)
Report called to 2 heart nurse.

## 2016-01-08 NOTE — Progress Notes (Signed)
Patient returning from OR and placed back on full vent support.

## 2016-01-08 NOTE — Progress Notes (Addendum)
Pt was diaphoretic and even more sedated. Rapid response nurse here. Narcan ordered and given. MD notified. Fredirick Maudlin here to assess patient. Her sats are at 98% on the non rebreather. Blood gas drawn. Pt slightly more alert. Is moaning. States her name. Grips bilaterally weak. Moves her toes bilaterally. Request placed for step down bed.

## 2016-01-08 NOTE — Progress Notes (Signed)
Patient transported on vent from 2H18 to 2H01. Vitals remained stable.

## 2016-01-08 NOTE — Progress Notes (Signed)
Utilization Review Completed.Donne Anon T6/25/2017

## 2016-01-08 NOTE — Procedures (Signed)
Intubation Procedure Note QUANTISHA MARSICANO [id number] 1946/05/29  Procedure: Intubation Indications: Respiratory insufficiency  Procedure Details Consent: Risks of procedure as well as the alternatives and risks of each were explained to the (patient/caregiver).  Consent for procedure obtained. Time Out: Verified patient identification, verified procedure, site/side was marked, verified correct patient position, special equipment/implants available, medications/allergies/relevent history reviewed, required imaging and test results available.  Performed  Maximum sterile technique was used including gloves, gown and hand hygiene.   8.0 ETT passed without difficulty under direct vis with 3.0 MAC glidescope. Position confirmed by ETCO2, auscultation, direct vis.     Evaluation Hemodynamic Status: BP stable throughout; O2 sats: stable throughout Patient's Current Condition: stable Complications: No apparent complications Patient did tolerate procedure well. Chest X-ray ordered to verify placement.  CXR: pending.   Baltazar Apo, MD, PhD 01/08/2016, 8:53 AM [facility] [phone] or if no answer [phone]

## 2016-01-08 NOTE — Brief Op Note (Signed)
01/07/2016 - 01/08/2016  3:43 PM  PATIENT:  [name]  70 y.o. female  PRE-OPERATIVE DIAGNOSIS:  LEFT DISTAL FEMUR FRACTURE  POST-OPERATIVE DIAGNOSIS:  LEFT DISTAL FEMUR FRACTURE  PROCEDURE:  Procedure(s): INTRAMEDULLARY (IM) RETROGRADE FEMORAL NAILING (Left)  SURGEON:  Surgeon(s) and Role:    * Mcarthur Rossetti, MD - Primary  PHYSICIAN ASSISTANT: Benita Stabile, PA-C  ANESTHESIA:   general  EBL:  Total I/O In: 1504.3 [I.V.:1504.3] Out: 475 [Urine:275; Blood:200]  COUNTS:  YES  DICTATION: .Other Dictation: Dictation Number [id number]  PLAN OF CARE: Admit to inpatient   PATIENT DISPOSITION:  ICU - intubated and hemodynamically stable.   Delay start of Pharmacological VTE agent (>24hrs) due to surgical blood loss or risk of bleeding: no

## 2016-01-08 NOTE — Transfer of Care (Signed)
Immediate Anesthesia Transfer of Care Note  Patient: Caroline Hill  Procedure(s) Performed: Procedure(s): INTRAMEDULLARY (IM) RETROGRADE FEMORAL NAILING (Left)  Patient Location: ICU  Anesthesia Type:General  Level of Consciousness: sedated and unresponsive  Airway & Oxygen Therapy: Patient remains intubated per anesthesia plan and Patient placed on Ventilator (see vital sign flow sheet for setting)  Post-op Assessment: Report given to RN and Post -op Vital signs reviewed and stable  Post vital signs: Reviewed and stable  Last Vitals:  Filed Vitals:   01/08/16 1330 01/08/16 1345  BP: 106/59 107/58  Pulse: 87 95  Temp:    Resp: 16 14    Last Pain:  Filed Vitals:   01/08/16 1506  PainSc: 10-Worst pain ever         Complications: No apparent anesthesia complications.  Fentanyl gtt that patient came to OR on returned to ICU RN.

## 2016-01-08 NOTE — Progress Notes (Signed)
Pt was given 4 mg of morphine prior to transfer to floor. O2 sats in low 80's and dropping to 70's on [place] at 5L. Respiratory therapy notified. Pt is very sedated and has to be awakened frequently to take deep breaths. Placed on non rebreather at 92%. Sats improved to 96%.

## 2016-01-08 NOTE — Progress Notes (Signed)
Patient ID: Caroline Hill, female   DOB: Oct 30, 1945, 70 y.o.   MRN: [id number] I have spoken with the ED MD and reviewed her films.  She will need an intramedullary rod/nail today to stabilize her femur fracture.  I will get her on the OR schedule for hopefully later this am.  In the interim, she will need Medical evaluation and clearance for surgery and should remain NPO.

## 2016-01-08 NOTE — Progress Notes (Signed)
2 Silver colored rings (1band and 1 w/clear stone) given to husband Fritz Pickerel in clear plastic bag.  Family and O.R. Staff present when taken off finger and given to husband.
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
## 2016-01-09 ENCOUNTER — Encounter (HOSPITAL_COMMUNITY): Payer: Self-pay | Admitting: Orthopaedic Surgery

## 2016-01-09 DIAGNOSIS — J449 Chronic obstructive pulmonary disease, unspecified: Secondary | ICD-10-CM

## 2016-01-09 LAB — POCT I-STAT 3, ART BLOOD GAS (G3+)
Acid-Base Excess: 3 mmol/L — ABNORMAL HIGH (ref 0.0–2.0)
BICARBONATE: 30.4 meq/L — AB (ref 20.0–24.0)
O2 Saturation: 96 %
PO2 ART: 97 mmHg (ref 80.0–100.0)
TCO2: 32 mmol/L (ref 0–100)
pCO2 arterial: 66.2 mmHg (ref 35.0–45.0)
pH, Arterial: 7.273 — ABNORMAL LOW (ref 7.350–7.450)

## 2016-01-09 LAB — CBC
HEMATOCRIT: 29.6 % — AB (ref 36.0–46.0)
Hemoglobin: 8.9 g/dL — ABNORMAL LOW (ref 12.0–15.0)
MCH: 29 pg (ref 26.0–34.0)
MCHC: 30.7 g/dL (ref 30.0–36.0)
MCV: 94.3 fL (ref 78.0–100.0)
Platelets: 237 10*3/uL (ref 150–400)
RBC: 3.14 MIL/uL — ABNORMAL LOW (ref 3.87–5.11)
RDW: 14.4 % (ref 11.5–15.5)
WBC: 11 10*3/uL — AB (ref 4.0–10.5)

## 2016-01-09 LAB — GLUCOSE, CAPILLARY
GLUCOSE-CAPILLARY: 110 mg/dL — AB (ref 65–99)
GLUCOSE-CAPILLARY: 111 mg/dL — AB (ref 65–99)
GLUCOSE-CAPILLARY: 141 mg/dL — AB (ref 65–99)
GLUCOSE-CAPILLARY: 166 mg/dL — AB (ref 65–99)
Glucose-Capillary: 175 mg/dL — ABNORMAL HIGH (ref 65–99)
Glucose-Capillary: 151 mg/dL — ABNORMAL HIGH (ref 65–99)

## 2016-01-09 LAB — BASIC METABOLIC PANEL
ANION GAP: 5 (ref 5–15)
BUN: 21 mg/dL — ABNORMAL HIGH (ref 6–20)
CALCIUM: 8.3 mg/dL — AB (ref 8.9–10.3)
CO2: 31 mmol/L (ref 22–32)
Chloride: 103 mmol/L (ref 101–111)
Creatinine, Ser: 0.88 mg/dL (ref 0.44–1.00)
Glucose, Bld: 163 mg/dL — ABNORMAL HIGH (ref 65–99)
Potassium: 3.5 mmol/L (ref 3.5–5.1)
Sodium: 139 mmol/L (ref 135–145)

## 2016-01-09 MED ORDER — METHYLPREDNISOLONE SODIUM SUCC 40 MG IJ SOLR
40.0000 mg | Freq: Three times a day (TID) | INTRAMUSCULAR | Status: DC
  Administered 2016-01-09 – 2016-01-10 (×3): 40 mg via INTRAVENOUS
  Filled 2016-01-09 (×3): qty 1

## 2016-01-09 MED ORDER — PHENYLEPHRINE HCL 10 MG/ML IJ SOLN
0.0000 ug/min | INTRAVENOUS | Status: DC
  Administered 2016-01-09: 50 ug/min via INTRAVENOUS
  Administered 2016-01-09: 10 ug/min via INTRAVENOUS
  Administered 2016-01-09: 20 ug/min via INTRAVENOUS
  Filled 2016-01-09 (×3): qty 1

## 2016-01-09 MED ORDER — LORAZEPAM 2 MG/ML IJ SOLN
1.0000 mg | INTRAMUSCULAR | Status: DC | PRN
  Administered 2016-01-09 – 2016-01-10 (×2): 1 mg via INTRAVENOUS
  Filled 2016-01-09 (×2): qty 1

## 2016-01-09 MED ORDER — SODIUM CHLORIDE 0.9 % IV BOLUS (SEPSIS)
1000.0000 mL | Freq: Once | INTRAVENOUS | Status: AC
  Administered 2016-01-09: 1000 mL via INTRAVENOUS

## 2016-01-09 MED ORDER — FENTANYL CITRATE (PF) 100 MCG/2ML IJ SOLN
25.0000 ug | INTRAMUSCULAR | Status: DC | PRN
  Administered 2016-01-09 (×2): 50 ug via INTRAVENOUS
  Administered 2016-01-09: 25 ug via INTRAVENOUS
  Administered 2016-01-09: 50 ug via INTRAVENOUS
  Administered 2016-01-10 – 2016-01-11 (×2): 25 ug via INTRAVENOUS
  Administered 2016-01-11 – 2016-01-13 (×3): 50 ug via INTRAVENOUS
  Administered 2016-01-13: 25 ug via INTRAVENOUS
  Filled 2016-01-09 (×10): qty 2

## 2016-01-09 MED ORDER — POTASSIUM CHLORIDE 20 MEQ/15ML (10%) PO SOLN
40.0000 meq | Freq: Once | ORAL | Status: AC
  Administered 2016-01-09: 40 meq
  Filled 2016-01-09: qty 30

## 2016-01-09 MED ORDER — LACTATED RINGERS IV BOLUS (SEPSIS): 1000.0000 mL | Freq: Once | INTRAVENOUS | Status: DC

## 2016-01-09 MED ORDER — PNEUMOCOCCAL VAC POLYVALENT 25 MCG/0.5ML IJ INJ
0.5000 mL | INJECTION | INTRAMUSCULAR | Status: AC
  Administered 2016-01-10: 0.5 mL via INTRAMUSCULAR

## 2016-01-09 NOTE — Progress Notes (Signed)
eLink Physician-Brief Progress Note Patient Name: Caroline Hill DOB: Jan 14, 1946 MRN: [id number]   Date of Service  01/09/2016  HPI/Events of Note  Ativan prn added for agitation   eICU Interventions       Intervention Category Minor Interventions: Agitation / anxiety - evaluation and management  [name] S. 01/09/2016, 7:39 PM

## 2016-01-09 NOTE — Progress Notes (Signed)
eLink Physician-Brief Progress Note Patient Name: Caroline Hill DOB: 1946-02-28 MRN: [id number]   Date of Service  01/09/2016  HPI/Events of Note  Notified by bedside nurse of borderline hypotension. Patient currently on FiO2 0.4. Has peripheral IV access.   eICU Interventions  1. LR bolus 1 L IV 2. Low-dose Neo-Synephrine to maintain mean arterial pressure & SBP while awaiting bolus  3. Nurse to notify for any change in clinical status     Intervention Category Major Interventions: Hypotension - evaluation and management  Tera Partridge 01/09/2016, 12:14 AM

## 2016-01-09 NOTE — Progress Notes (Signed)
Initial Nutrition Assessment  DOCUMENTATION CODES:   Not applicable  INTERVENTION:  TF recommendations: Vital AF 1.2 @ 64m/hr Provides 1296 calories, 81 grams of protein, and 8774mfree water  NUTRITION DIAGNOSIS:   Inadequate oral intake related to inability to eat as evidenced by NPO status.  GOAL:   Patient will meet greater than or equal to 90% of their needs  MONITOR:   Diet advancement, Vent status, Labs, Weight trends, I & O's  REASON FOR ASSESSMENT:   Consult, Ventilator Hip fracture protocol  ASSESSMENT:   Caroline MATHISENs a 89.o. female with a past medical history significant for COPD on home O2, CAD, and HTN who presents with fall  Patient is currently intubated on ventilator support MV: 6.3 L/min Temp (24hrs), Avg:98.5 F (36.9 C), Min:98.1 F (36.7 C), Max:99.3 F (37.4 C)  Propofol: None  [name] is a pleasant woman who is currently intubated, awake, and alert on the vent. She nods stating she was eating well PTA, denies weight loss. Per chart, her weight is up 16# from her previous admission, some  Of which is likely fluid with hip fracture.  Nutrition-Focused physical exam completed. Findings are mild-moderate fat depletion, moderate-severe muscle depletion, and no edema.   Per RN and Pulmonology, she is undergoing SBTs with goal extubation. May be extubated later today, but more likely tomorrow.  Labs and Medications reviewed: Neo Drip CBGs 155-171  Diet Order:     Skin:  Wound (see comment) (closed incision to L leg)  Last BM:  6/24  Height:   Ht Readings from Last 1 Encounters:  01/08/16 '5\' 6"'$  (1.676 m)    Weight:   Wt Readings from Last 1 Encounters:  01/09/16 125 lb 10.6 oz (57 kg)    Ideal Body Weight:  59.09 kg  BMI:  Body mass index is 20.29 kg/(m^2).  Estimated Nutritional Needs:   Kcal:  1234 calories  Protein:  69-86 grams  Fluid:  >/= 1.2L  EDUCATION NEEDS:   No education needs identified at this  time  WiSatira AnisWard, MS, RD LDN Inpatient Clinical Dietitian Pager [phone]

## 2016-01-09 NOTE — Progress Notes (Addendum)
PULMONARY / CRITICAL CARE MEDICINE   Name: Caroline Hill MRN: [id number] DOB: [birth year]    ADMISSION DATE:  01/07/2016 CONSULTATION DATE:  01/08/16  REFERRING MD:  Dr Waldron Labs, TR[facility]  CHIEF COMPLAINT:  Acute Respiratory failure, obtundation  HISTORY OF PRESENT ILLNESS:   70 yo woman, hx of COPD and chronic hypoxemia, bipolar disease with schizophrenia. She suffered a fall 6/24 and sustained a L femur fracture. She received narcotics for pain control, evolved slowly worsening MS. ABG confirmed hypercapneic resp failure. She received narcan, BiPAP was started but she did not turn resolve her hypercapnia. AM 6/25 she remains obtunded. Will plan to ET intubate for ventilation and in prep for probable L femur fracture.   SUBJECTIVE:  Intubated Awake on fent gtt On low dose neo gtt Afebrile Poor UO overnight  VITAL SIGNS: BP 125/63 mmHg  Pulse 103  Temp(Src) 98.6 F (37 C) (Oral)  Resp 13  Ht '5\' 6"'$  (1.676 m)  Wt 125 lb 10.6 oz (57 kg)  BMI 20.29 kg/m2  SpO2 100%  HEMODYNAMICS:    VENTILATOR SETTINGS: Vent Mode:  [-] PSV;CPAP FiO2 (%):  [40 %-100 %] 40 % Set Rate:  [16 bmp] 16 bmp Vt Set:  [470 mL] 470 mL PEEP:  [5 cmH20] 5 cmH20 Pressure Support:  [8 cmH20] 8 cmH20 Plateau Pressure:  [12 cmH20-16 cmH20] 15 cmH20  INTAKE / OUTPUT: I/O last 3 completed shifts: In: 4692 [I.V.:3492; IV Piggyback:1200] Out: 1250 [Urine:1050; Blood:200]  PHYSICAL EXAMINATION: General:  Elderly frail woman, intubated Neuro:  Awake , follows commands HEENT:  OP dry, PERRL, edentulous  Cardiovascular:  Regular, no M,  Lungs:  Very distant, soft end-exp wheeze Abdomen:  Soft, benign Musculoskeletal:  L LE is in traction Skin:  No rash  LABS:  BMET  Recent Labs Lab 01/08/16 0005 01/08/16 0445 01/09/16 0324  NA 139 140 139  K 3.5 3.8 3.5  CL 98* 97* 103  CO2 34* 40* 31  BUN 13 11 21*  CREATININE 0.59 0.48 0.88  GLUCOSE 111* 174* 163*    Electrolytes  Recent Labs Lab  01/08/16 0005 01/08/16 0445 01/09/16 0324  CALCIUM 8.8* 9.1 8.3*    CBC  Recent Labs Lab 01/08/16 0005 01/08/16 0445 01/09/16 0324  WBC 9.4 15.0* 11.0*  HGB 12.1 12.8 8.9*  HCT 38.9 42.1 29.6*  PLT 280 325 237    Coag's  Recent Labs Lab 01/08/16 0005  APTT 32  INR 1.09    Sepsis Markers No results for input(s): LATICACIDVEN, PROCALCITON, O2SATVEN in the last 168 hours.  ABG  Recent Labs Lab 01/08/16 0515 01/08/16 0835 01/08/16 1208  PHART 7.207* 7.124* 7.361  PCO2ART 93.2* CRITICAL RESULT CALLED TO, READ BACK BY AND VERIFIED WITH: 68.5*  PO2ART 187* 112* 184.0*    Liver Enzymes No results for input(s): AST, ALT, ALKPHOS, BILITOT, ALBUMIN in the last 168 hours.  Cardiac Enzymes  Recent Labs Lab 01/08/16 0005  TROPONINI <0.03    Glucose  Recent Labs Lab 01/08/16 0753 01/08/16 0951 01/08/16 2057 01/08/16 2344 01/09/16 0337 01/09/16 0809  GLUCAP 153* 157* 176* 111* 166* 175*    Imaging Dg Chest Port 1 View  01/08/2016  CLINICAL DATA:  Acute respiratory failure, intubated EXAM: PORTABLE CHEST 1 VIEW COMPARISON:  Chest radiograph from earlier today. FINDINGS: Endotracheal tube tip is 2.9 cm above the carina. Stable cardiomediastinal silhouette with normal heart size. No pneumothorax. No pleural effusion. Lungs appear clear, with no acute consolidative airspace disease and no pulmonary edema. IMPRESSION:  Well-positioned endotracheal tube. No active cardiopulmonary disease. Electronically Signed   By: Ilona Sorrel M.D.   On: 01/08/2016 09:09   Dg C-arm 61-120 Min  01/08/2016  CLINICAL DATA:  Fracture repair. FLUOROSCOPY TIME:  156 seconds Images: 5 EXAM: DG C-ARM 61-120 MIN COMPARISON:  Film from earlier today FINDINGS: An intra medullary rod has been placed across the femoral fracture with improvement of alignment and appropriate placement of hardware. IMPRESSION: Appropriate placement of an intra medullary rod across the femoral fracture.  Electronically Signed   By: Dorise Bullion III M.D   On: 01/08/2016 17:24   Dg Femur Min 2 Views Left  01/08/2016  CLINICAL DATA:  Status post femur fracture repair EXAM: LEFT FEMUR 2 VIEWS COMPARISON:  January 08, 2016 FINDINGS: An intra medullary rod has been placed across the femoral fracture affixed both proximally and distally with screws. Hardware is in good position. Alignment is significantly improved. IMPRESSION: Status post repair of a femoral fracture with an intra medullary rod. Electronically Signed   By: Dorise Bullion III M.D   On: 01/08/2016 16:34     STUDIES:  L LE XR 6/25 >> L femoral fracture, displaced and shortened Head CT 6/25 >> no acute bleed or CVA CT c spine 6/25 >> no cervical spine injury  CULTURES:  ANTIBIOTICS: Cefazolin prophylactic for SGY 6/25  SIGNIFICANT EVENTS: S/p fall and L hip fx 6/25 ET intubated for hypercapneic resp failure 6/25  LINES/TUBES: ETT 6/25 >>   DISCUSSION: 70 yo woman, hx COPD chronic hypoxemia, schizophrenia/ bipolar d/o. S/p mechanical fall, ? Whether she experienced syncope as this was unwitnessed. Has L femur fracture. Evolving hypercapneic resp failure in setting narcotics. Intubated on 6/25 am.   ASSESSMENT / PLAN:  PULMONARY A: Acute on chronic hypercapneic and hypoxic resp failure Severe COPD with mild wheeze, unclear active bronchospasm P:   SBTs with goal extubation Empiric steroids started 6/25 am, will continue these for now although unclear that she has active bronchospasm  D/c brovana Scheduled duoneb Continue budesonide nebs   CARDIOVASCULAR A:  HTN ? Possible syncope, fall was unwitnessed P:  Hold amlodipine Follow tele, unclear whether syncope workup is needed here.   RENAL A:   hypokalemia P:   Follow BMP intermittently replete lytes as needed  GASTROINTESTINAL A:   SUP Nutrition  P:   NPO for femur SGY, TF or diet when able Start protonix per tube  HEMATOLOGIC A:    Leukocytosis Acute Blood loss anemia P:  Follow CBC Transfuse for Hb <7  INFECTIOUS A:   No evidence for active infxn at this time.  P:   Cefazolin prophylaxis for surgery  ENDOCRINE A:   At risk hyperglycemia P:   Ct SSI, especially while on steroids.   NEUROLOGIC A:   Obtundation, almost certainly due to hypercapnia.  Baseline schizophrenia and bipolar d/o.  P:   RASS goal: 0 Change to fentanyl prn  Will have to be careful with narcs in prep for extubation, especially in light of multiple home meds that will alter MS. Have continued the home regimen for now    FAMILY  - Updates: Son was updated am 6/25 by Drs Waldron Labs and Ninfa Linden by phone.   - Inter-disciplinary family meet or Palliative Care meeting due by:  01/14/16   CC time 35 minutes   Kara Mead MD. [facility]. [place] [facility] Pager [phone] If no response call [phone]   01/09/2016   01/09/2016, 8:32 AM

## 2016-01-09 NOTE — Anesthesia Postprocedure Evaluation (Signed)
Anesthesia Post Note  Patient: Caroline Hill  Procedure(s) Performed: Procedure(s) (LRB): INTRAMEDULLARY (IM) RETROGRADE FEMORAL NAILING (Left)  Patient location during evaluation: ICU Anesthesia Type: General Level of consciousness: sedated and patient remains intubated per anesthesia plan Pain management: pain level controlled Vital Signs Assessment: post-procedure vital signs reviewed and stable Respiratory status: patient remains intubated per anesthesia plan and patient on ventilator - see flowsheet for VS Cardiovascular status: stable Postop Assessment: no signs of nausea or vomiting Anesthetic complications: no    Last Vitals:  Filed Vitals:   01/09/16 0630 01/09/16 0700  BP: 102/63 99/51  Pulse: 88 89  Temp:    Resp: 22 16    Last Pain:  Filed Vitals:   01/09/16 0703  PainSc: 10-Worst pain ever                 Gerardo Caiazzo,E. Brianca Fortenberry

## 2016-01-09 NOTE — Care Management Important Message (Signed)
Important Message  Patient Details  Name: Caroline Hill MRN: [id number] Date of Birth: 10/18/1945   Medicare Important Message Given:  Yes    Loann Quill 01/09/2016, 8:42 AM

## 2016-01-09 NOTE — Progress Notes (Signed)
PT Cancellation Note  Patient Details Name: Caroline Hill MRN: [id number] DOB: 1946-03-16   Cancelled Treatment:    Reason Eval/Treat Not Completed: Patient not medically ready (Pt on neosynephrine ). PT will continue to follow acutely and will return once pt more medically appropriate for exertional activity.   Collie Siad PT, DPT  Pager: [phone] Phone: [phone] 01/09/2016, 11:20 AM

## 2016-01-09 NOTE — Progress Notes (Signed)
Subjective: 1 Day Post-Op Procedure(s) (LRB): INTRAMEDULLARY (IM) RETROGRADE FEMORAL NAILING (Left) Patient is intubated, but alert.  Follows commands.  Tolerated surgery well yesterday.  Acute blood loss anemia from surgery and her fracture.   Objective: Vital signs in last 24 hours: Temp:  [97.7 F (36.5 C)-98.4 F (36.9 C)] 98.1 F (36.7 C) (06/26 0315) Pulse Rate:  [82-127] 89 (06/26 0700) Resp:  [14-32] 16 (06/26 0700) BP: (66-156)/(42-98) 99/51 mmHg (06/26 0700) SpO2:  [95 %-100 %] 100 % (06/26 0700) FiO2 (%):  [40 %-100 %] 40 % (06/26 0310) Weight:  [57 kg (125 lb 10.6 oz)] 57 kg (125 lb 10.6 oz) (06/26 0357)  Intake/Output from previous day: 06/25 0701 - 06/26 0700 In: 3975.3 [I.V.:2775.3; IV Piggyback:1200] Out: 925 [Urine:725; Blood:200] Intake/Output this shift:     Recent Labs  01/08/16 0005 01/08/16 0445 01/09/16 0324  HGB 12.1 12.8 8.9*    Recent Labs  01/08/16 0445 01/09/16 0324  WBC 15.0* 11.0*  RBC 4.52 3.14*  HCT 42.1 29.6*  PLT 325 237    Recent Labs  01/08/16 0445 01/09/16 0324  NA 140 139  K 3.8 3.5  CL 97* 103  CO2 40* 31  BUN 11 21*  CREATININE 0.48 0.88  GLUCOSE 174* 163*  CALCIUM 9.1 8.3*    Recent Labs  01/08/16 0005  INR 1.09    Intact pulses distally Dorsiflexion/Plantar flexion intact Incision: dressing C/D/I Compartment soft  Assessment/Plan: 1 Day Post-Op Procedure(s) (LRB): INTRAMEDULLARY (IM) RETROGRADE FEMORAL NAILING (Left) Up with therapy when extubated; only tough down to 25% weight on her left leg for the next 6 weeks.  BLACKMAN,CHRISTOPHER Y 01/09/2016, 7:50 AM

## 2016-01-09 NOTE — Clinical Social Work Note (Signed)
LCSW consulted. Now following for disposition planning.  Lubertha Sayres, [facility]: [phone] Surgical: [phone]

## 2016-01-10 ENCOUNTER — Inpatient Hospital Stay (HOSPITAL_COMMUNITY): Payer: Medicare HMO

## 2016-01-10 DIAGNOSIS — S72352A Displaced comminuted fracture of shaft of left femur, initial encounter for closed fracture: Secondary | ICD-10-CM | POA: Diagnosis not present

## 2016-01-10 LAB — CBC
HCT: 26.6 % — ABNORMAL LOW (ref 36.0–46.0)
HEMOGLOBIN: 8.2 g/dL — AB (ref 12.0–15.0)
MCH: 28.2 pg (ref 26.0–34.0)
MCHC: 30.8 g/dL (ref 30.0–36.0)
MCV: 91.4 fL (ref 78.0–100.0)
PLATELETS: 201 10*3/uL (ref 150–400)
RBC: 2.91 MIL/uL — AB (ref 3.87–5.11)
RDW: 14.7 % (ref 11.5–15.5)
WBC: 11.1 10*3/uL — AB (ref 4.0–10.5)

## 2016-01-10 LAB — BASIC METABOLIC PANEL
ANION GAP: 5 (ref 5–15)
ANION GAP: 6 (ref 5–15)
BUN: 18 mg/dL (ref 6–20)
BUN: 16 mg/dL (ref 6–20)
CHLORIDE: 104 mmol/L (ref 101–111)
CHLORIDE: 105 mmol/L (ref 101–111)
CO2: 32 mmol/L (ref 22–32)
CO2: 32 mmol/L (ref 22–32)
Calcium: 9.2 mg/dL (ref 8.9–10.3)
Calcium: 9.4 mg/dL (ref 8.9–10.3)
Creatinine, Ser: 0.52 mg/dL (ref 0.44–1.00)
Creatinine, Ser: 0.54 mg/dL (ref 0.44–1.00)
GFR calc Af Amer: >60 mL/min (ref >60)
GFR calc Af Amer: >60 mL/min (ref >60)
GLUCOSE: 128 mg/dL — AB (ref 65–99)
GLUCOSE: 130 mg/dL — AB (ref 65–99)
POTASSIUM: 4.1 mmol/L (ref 3.5–5.1)
POTASSIUM: 4.4 mmol/L (ref 3.5–5.1)
SODIUM: 142 mmol/L (ref 135–145)
Sodium: 142 mmol/L (ref 135–145)

## 2016-01-10 LAB — GLUCOSE, CAPILLARY
GLUCOSE-CAPILLARY: 146 mg/dL — AB (ref 65–99)
GLUCOSE-CAPILLARY: 112 mg/dL — AB (ref 65–99)
GLUCOSE-CAPILLARY: 119 mg/dL — AB (ref 65–99)
GLUCOSE-CAPILLARY: 115 mg/dL — AB (ref 65–99)
Glucose-Capillary: 122 mg/dL — ABNORMAL HIGH (ref 65–99)
Glucose-Capillary: 136 mg/dL — ABNORMAL HIGH (ref 65–99)

## 2016-01-10 LAB — POCT I-STAT 3, ART BLOOD GAS (G3+)
ACID-BASE EXCESS: 8 mmol/L — AB (ref 0.0–2.0)
BICARBONATE: 33.4 meq/L — AB (ref 20.0–24.0)
O2 SAT: 99 %
TCO2: 35 mmol/L (ref 0–100)
pCO2 arterial: 54 mmHg — ABNORMAL HIGH (ref 35.0–45.0)
pH, Arterial: 7.399 (ref 7.350–7.450)
pO2, Arterial: 124 mmHg — ABNORMAL HIGH (ref 80.0–100.0)

## 2016-01-10 LAB — MAGNESIUM: MAGNESIUM: 1.9 mg/dL (ref 1.7–2.4)

## 2016-01-10 MED ORDER — METOPROLOL TARTRATE 5 MG/5ML IV SOLN
2.5000 mg | INTRAVENOUS | Status: DC | PRN
  Administered 2016-01-10: 2.5 mg via INTRAVENOUS
  Administered 2016-01-10 – 2016-01-13 (×5): 5 mg via INTRAVENOUS
  Administered 2016-01-13 – 2016-01-14 (×2): 2.5 mg via INTRAVENOUS
  Filled 2016-01-10 (×9): qty 5

## 2016-01-10 MED ORDER — METHYLPREDNISOLONE SODIUM SUCC 40 MG IJ SOLR
40.0000 mg | Freq: Two times a day (BID) | INTRAMUSCULAR | Status: DC
  Administered 2016-01-10: 40 mg via INTRAVENOUS
  Filled 2016-01-10 (×2): qty 1

## 2016-01-10 MED ORDER — METOPROLOL TARTRATE 5 MG/5ML IV SOLN
5.0000 mg | Freq: Once | INTRAVENOUS | Status: AC
  Administered 2016-01-11: 5 mg via INTRAVENOUS

## 2016-01-10 MED ORDER — ASPIRIN 325 MG PO TBEC: 325.0000 mg | DELAYED_RELEASE_TABLET | Freq: Every day | ORAL | Status: DC

## 2016-01-10 MED ORDER — BUDESONIDE 0.5 MG/2ML IN SUSP
0.5000 mg | Freq: Two times a day (BID) | RESPIRATORY_TRACT | Status: DC
Start: 2016-01-10 — End: 2016-01-16
  Administered 2016-01-10 – 2016-01-16 (×12): 0.5 mg via RESPIRATORY_TRACT
  Filled 2016-01-10 (×13): qty 2

## 2016-01-10 NOTE — Progress Notes (Signed)
125 cc's Fentanyl wasted in sink. Witnessed by [employer]. RN

## 2016-01-10 NOTE — Progress Notes (Signed)
Subjective: 2 Days Post-Op Procedure(s) (LRB): INTRAMEDULLARY (IM) RETROGRADE FEMORAL NAILING (Left) Patient reports pain as moderate.  Is extubated now.  Acute blood loss anemai from her surgery and fracture.  Objective: Vital signs in last 24 hours: Temp:  [97.8 F (36.6 C)-98.8 F (37.1 C)] 98.8 F (37.1 C) (06/27 1139) Pulse Rate:  [82-146] 141 (06/27 1312) Resp:  [9-28] 18 (06/27 1312) BP: (80-151)/(50-85) 131/80 mmHg (06/27 1312) SpO2:  [93 %-100 %] 99 % (06/27 1312) FiO2 (%):  [40 %] 40 % (06/27 1002) Weight:  [56 kg (123 lb 7.3 oz)] 56 kg (123 lb 7.3 oz) (06/27 0402)  Intake/Output from previous day: 06/26 0701 - 06/27 0700 In: 2035 [I.V.:1485] Out: 550 [Urine:550] Intake/Output this shift: Total I/O In: 250 [I.V.:250] Out: -    Recent Labs  01/08/16 0005 01/08/16 0445 01/09/16 0324 01/10/16 0249  HGB 12.1 12.8 8.9* 8.2*    Recent Labs  01/09/16 0324 01/10/16 0249  WBC 11.0* 11.1*  RBC 3.14* 2.91*  HCT 29.6* 26.6*  PLT 237 201    Recent Labs  01/10/16 0029 01/10/16 0249  NA 142 142  K 4.1 4.4  CL 105 104  CO2 32 32  BUN 18 16  CREATININE 0.52 0.54  GLUCOSE 130* 128*  CALCIUM 9.2 9.4    Recent Labs  01/08/16 0005  INR 1.09    Sensation intact distally Intact pulses distally Dorsiflexion/Plantar flexion intact Incision: scant drainage No cellulitis present Compartment soft  Assessment/Plan: 2 Days Post-Op Procedure(s) (LRB): INTRAMEDULLARY (IM) RETROGRADE FEMORAL NAILING (Left) Up with therapy when allowed - TDWB to up to 25% on her left leg. Can use either heparin or lovenox from an ortho standpoint.  If good mobility, I usually only treat with aspirin for DVT coverage for this type of injury unless patient was on a blood thinner pre-hospitalization.  Hallee Mckenny Y 01/10/2016, 2:03 PM

## 2016-01-10 NOTE — Procedures (Signed)
Extubation Procedure Note  Patient Details:   Name: Caroline Hill DOB: [birth year] MRN: [id number]   Airway Documentation:     Evaluation  O2 sats: stable throughout Complications: No apparent complications Patient did tolerate procedure well. Bilateral Breath Sounds: Clear   Yes   Pt. Was extubated to a 4L [place] without any complications, dyspnea or stridor noted. Pt. Was instructed on IS x 5, highest goal achieved was 128m.  Koralyn Prestage L 01/10/2016, 1:12 PM

## 2016-01-10 NOTE — Progress Notes (Signed)
OT Cancellation Note  Patient Details Name: Caroline Hill MRN: [id number] DOB: 1946/06/23   Cancelled Treatment:    Reason Eval/Treat Not Completed: Other (comment). Spoke with PT and per RN pt is to be extubated later today and would be better to see pt then. Will try back at a later time as schedule allows.  Almon Register [phone] 01/10/2016, 11:09 AM

## 2016-01-10 NOTE — Progress Notes (Signed)
MD notified about pt sustaining 140's NSR with frequent PACs. Extra dose of lopressor ordered. Pt confused and pulled out foley. Order placed by RN for sitter.

## 2016-01-10 NOTE — Progress Notes (Signed)
eLink Physician-Brief Progress Note Patient Name: Caroline Hill DOB: Jan 11, 1946 MRN: [id number]   Date of Service  01/10/2016  HPI/Events of Note  Bedside nurse reports runs of SVT. Now NSR with rate of 87.  eICU Interventions  Will order: 1. BMP and Mg++ level STAT.     Intervention Category Intermediate Interventions: Arrhythmia - evaluation and management  Sommer,Steven Eugene 01/10/2016, 12:19 AM

## 2016-01-10 NOTE — Progress Notes (Signed)
PULMONARY / CRITICAL CARE MEDICINE   Name: Caroline Hill MRN: [id number] DOB: 10-15-1945    ADMISSION DATE:  01/07/2016 CONSULTATION DATE:  01/08/16  REFERRING MD:  Dr Waldron Labs, TR[facility]  CHIEF COMPLAINT:  Acute Respiratory failure, obtundation  HISTORY OF PRESENT ILLNESS:   70 yo woman, hx of COPD and chronic hypoxemia, bipolar disease with schizophrenia. She suffered a fall 6/24 and sustained a L femur fracture. She received narcotics for pain control, evolved slowly worsening MS. ABG confirmed hypercapneic resp failure. She received narcan, BiPAP was started but she did not turn resolve her hypercapnia. AM 6/25 she remains obtunded. Will plan to ET intubate for ventilation and in prep for probable L femur fracture.   SUBJECTIVE:  Episode of SVT overnight resolved spont Agitation given ativan @ 3a - poorly responsive this am Off neo gtt Afebrile Poor UO overnight  VITAL SIGNS: BP 109/61 mmHg  Pulse 89  Temp(Src) 98.1 F (36.7 C) (Oral)  Resp 16  Ht '5\' 6"'$  (1.676 m)  Wt 123 lb 7.3 oz (56 kg)  BMI 19.94 kg/m2  SpO2 99%  HEMODYNAMICS:    VENTILATOR SETTINGS: Vent Mode:  [-] PRVC FiO2 (%):  [40 %] 40 % Set Rate:  [16 bmp] 16 bmp Vt Set:  [470 mL] 470 mL PEEP:  [5 cmH20] 5 cmH20 Plateau Pressure:  [8 cmH20-13 cmH20] 13 cmH20  INTAKE / OUTPUT: I/O last 3 completed shifts: In: 2423 [I.V.:2426; Other:550; IV Piggyback:1200] Out: 900 [Urine:900]  PHYSICAL EXAMINATION: General:  Elderly frail woman, intubated Neuro:  Sleepy, arousable , follows commands HEENT:  OP dry, PERRL, edentulous  Cardiovascular:  Regular, no M,  Lungs:  Very distant, soft end-exp wheeze Abdomen:  Soft, benign Musculoskeletal:  L LE is in traction Skin:  No rash  LABS:  BMET  Recent Labs Lab 01/09/16 0324 01/10/16 0029 01/10/16 0249  NA 139 142 142  K 3.5 4.1 4.4  CL 103 105 104  CO2 31 32 32  BUN 21* 18 16  CREATININE 0.88 0.52 0.54  GLUCOSE 163* 130* 128*     Electrolytes  Recent Labs Lab 01/09/16 0324 01/10/16 0029 01/10/16 0249  CALCIUM 8.3* 9.2 9.4  MG  --  1.9  --     CBC  Recent Labs Lab 01/08/16 0445 01/09/16 0324 01/10/16 0249  WBC 15.0* 11.0* 11.1*  HGB 12.8 8.9* 8.2*  HCT 42.1 29.6* 26.6*  PLT 325 237 201    Coag's  Recent Labs Lab 01/08/16 0005  APTT 32  INR 1.09    Sepsis Markers No results for input(s): LATICACIDVEN, PROCALCITON, O2SATVEN in the last 168 hours.  ABG  Recent Labs Lab 01/08/16 0835 01/08/16 1208 01/09/16 0918  PHART 7.124* 7.361 7.273*  PCO2ART CRITICAL RESULT CALLED TO, READ BACK BY AND VERIFIED WITH: 68.5* 66.2*  PO2ART 112* 184.0* 97.0    Liver Enzymes No results for input(s): AST, ALT, ALKPHOS, BILITOT, ALBUMIN in the last 168 hours.  Cardiac Enzymes  Recent Labs Lab 01/08/16 0005  TROPONINI <0.03    Glucose  Recent Labs Lab 01/09/16 1149 01/09/16 1631 01/09/16 1957 01/10/16 0028 01/10/16 0422 01/10/16 0755  GLUCAP 151* 141* 110* 136* 146* 112*    Imaging No results found.   STUDIES:  L LE XR 6/25 >> L femoral fracture, displaced and shortened Head CT 6/25 >> no acute bleed or CVA CT c spine 6/25 >> no cervical spine injury  CULTURES:  ANTIBIOTICS: Cefazolin prophylactic for SGY 6/25  SIGNIFICANT EVENTS: S/p fall and L  hip fx 6/25 ET intubated for hypercapneic resp failure 6/25  LINES/TUBES: ETT 6/25 >>   DISCUSSION: 70 yo woman, hx COPD chronic hypoxemia, schizophrenia/ bipolar d/o. S/p mechanical fall, ? Whether she experienced syncope as this was unwitnessed. Has L femur fracture. Evolving hypercapneic resp failure in setting narcotics. Intubated on 6/25 am.   ASSESSMENT / PLAN:  PULMONARY A: Acute on chronic hypercapneic and hypoxic resp failure Severe COPD with mild wheeze, unclear active bronchospasm P:   SBTs with goal extubation once fully awake Drop solumedrol to 40 q 12  D/c brovana Scheduled duoneb Continue budesonide  nebs   CARDIOVASCULAR A:  HTN ? Possible syncope, fall was unwitnessed Episode of SVT 6/27 am -resolved spont P:  Hold amlodipine Follow tele, doubt syncope workup  needed here.   RENAL A:   hypokalemia P:   Follow BMP intermittently replete lytes as needed  GASTROINTESTINAL A:   SUP Nutrition  P:   NPO for femur SGY Start protonix per tube TF if not extubated  HEMATOLOGIC A:   Leukocytosis Acute Blood loss anemia P:  Follow CBC Transfuse for Hb <7  INFECTIOUS A:   No evidence for active infxn at this time.  P:   Cefazolin prophylaxis for surgery  ENDOCRINE A:   At risk hyperglycemia P:   Ct SSI, especially while on steroids.   NEUROLOGIC A:   Obtundation, almost certainly due to hypercapnia.  Baseline schizophrenia and bipolar d/o.  P:   RASS goal: 0 Change to fentanyl prn  Dc ativan Ct risperdal, lamictal, trazodone    FAMILY  - Updates: Son was updated am 6/25 by Drs Waldron Labs and Ninfa Linden by phone.   - Inter-disciplinary family meet or Palliative Care meeting due by:  01/14/16   CC time 35 minutes   Kara Mead MD. [facility]. [place] [facility] Pager [phone] If no response call [phone]    01/10/2016, 8:40 AM

## 2016-01-10 NOTE — Progress Notes (Signed)
PT Cancellation Note  Patient Details Name: Caroline Hill MRN: [id number] DOB: [birth year]   Cancelled Treatment:    Reason Eval/Treat Not Completed: Other (comment).  Per conversation w/ RN.  Plan is to attempt extubation this morning.  PT will attempt to see pt later this afternoon, schedule permitting, and if pt is medically appropriate.   Collie Siad PT, DPT  Pager: [phone] Phone: [phone] 01/10/2016, 10:14 AM

## 2016-01-10 NOTE — Progress Notes (Signed)
Patient ID: Caroline Hill, female   DOB: [birth year], 70 y.o.   MRN: [id number] Sinus Tachy 136/min - no evidence of pain or agitation. Pt appears to be sleeping.  Low dose PRN metoprolol to maintain HR < 115/min  Merton Border, MD PCCM service Mobile [phone] Pager [phone] 01/10/2016

## 2016-01-11 ENCOUNTER — Inpatient Hospital Stay (HOSPITAL_COMMUNITY): Payer: Medicare HMO

## 2016-01-11 DIAGNOSIS — I471 Supraventricular tachycardia: Secondary | ICD-10-CM

## 2016-01-11 LAB — CBC
HEMATOCRIT: 27.8 % — AB (ref 36.0–46.0)
HEMOGLOBIN: 8.5 g/dL — AB (ref 12.0–15.0)
MCH: 27.9 pg (ref 26.0–34.0)
MCHC: 30.6 g/dL (ref 30.0–36.0)
MCV: 91.1 fL (ref 78.0–100.0)
Platelets: 207 10*3/uL (ref 150–400)
RBC: 3.05 MIL/uL — AB (ref 3.87–5.11)
RDW: 14.5 % (ref 11.5–15.5)
WBC: 11.6 10*3/uL — AB (ref 4.0–10.5)

## 2016-01-11 LAB — BASIC METABOLIC PANEL
ANION GAP: 9 (ref 5–15)
BUN: 17 mg/dL (ref 6–20)
CALCIUM: 9.2 mg/dL (ref 8.9–10.3)
CHLORIDE: 98 mmol/L — AB (ref 101–111)
CO2: 30 mmol/L (ref 22–32)
Creatinine, Ser: 0.54 mg/dL (ref 0.44–1.00)
GFR calc non Af Amer: >60 mL/min (ref >60)
Glucose, Bld: 105 mg/dL — ABNORMAL HIGH (ref 65–99)
Potassium: 4.3 mmol/L (ref 3.5–5.1)
Sodium: 137 mmol/L (ref 135–145)

## 2016-01-11 LAB — GLUCOSE, CAPILLARY
GLUCOSE-CAPILLARY: 114 mg/dL — AB (ref 65–99)
GLUCOSE-CAPILLARY: 144 mg/dL — AB (ref 65–99)
GLUCOSE-CAPILLARY: 198 mg/dL — AB (ref 65–99)
Glucose-Capillary: 110 mg/dL — ABNORMAL HIGH (ref 65–99)
Glucose-Capillary: 96 mg/dL (ref 65–99)
Glucose-Capillary: 96 mg/dL (ref 65–99)

## 2016-01-11 LAB — TROPONIN I: TROPONIN I: 0.05 ng/mL — AB (ref <0.03)

## 2016-01-11 MED ORDER — HALOPERIDOL LACTATE 5 MG/ML IJ SOLN: 2.0000 mg | Freq: Four times a day (QID) | INTRAMUSCULAR | Status: DC | PRN

## 2016-01-11 MED ORDER — ENSURE ENLIVE PO LIQD
237.0000 mL | Freq: Three times a day (TID) | ORAL | Status: DC
  Administered 2016-01-11 – 2016-01-16 (×14): 237 mL via ORAL

## 2016-01-11 MED ORDER — ENOXAPARIN SODIUM 30 MG/0.3ML ~~LOC~~ SOLN
30.0000 mg | SUBCUTANEOUS | Status: DC
Start: 2016-01-11 — End: 2016-01-16
  Administered 2016-01-11 – 2016-01-16 (×6): 30 mg via SUBCUTANEOUS
  Filled 2016-01-11 (×6): qty 0.3

## 2016-01-11 MED ORDER — SODIUM CHLORIDE 0.9 % IV BOLUS (SEPSIS)
1000.0000 mL | Freq: Once | INTRAVENOUS | Status: AC
  Administered 2016-01-11: 1000 mL via INTRAVENOUS

## 2016-01-11 MED ORDER — HALOPERIDOL LACTATE 5 MG/ML IJ SOLN: 1.0000 mg | Freq: Four times a day (QID) | INTRAMUSCULAR | Status: DC | PRN

## 2016-01-11 MED ORDER — METHYLPREDNISOLONE SODIUM SUCC 40 MG IJ SOLR
40.0000 mg | INTRAMUSCULAR | Status: DC
  Administered 2016-01-11 – 2016-01-13 (×3): 40 mg via INTRAVENOUS
  Filled 2016-01-11 (×3): qty 1

## 2016-01-11 NOTE — Progress Notes (Signed)
Pt tachycardic 130's EKG performed showing sinus tachycardia. 1L NS fluid bolus started. 736m infused and pt HR increasing to 140's radial pulses bounding. MD okay to stop bolus. Advised to continue giving lopressor '5mg'$  q 3 hours to keep HR <115.

## 2016-01-11 NOTE — Progress Notes (Signed)
OT Cancellation Note  Patient Details Name: BRANDIN DILDAY MRN: [id number] DOB: 1945-08-17   Cancelled Treatment:    Reason Eval/Treat Not Completed: Medical issues which prohibited therapy. Pts current HR is sustained in the 140's--MD at room and plans on giving pt something to help with this. I will check back as schedule allows for evaluation of pt.  Almon Register [phone] 01/11/2016, 8:20 AM

## 2016-01-11 NOTE — Progress Notes (Signed)
PULMONARY / CRITICAL CARE MEDICINE   Name: Caroline Hill MRN: [id number] DOB: 12-20-1945    ADMISSION DATE:  01/07/2016 CONSULTATION DATE:  01/08/16  REFERRING MD:  Dr Waldron Labs, [facility]  CHIEF COMPLAINT:  Acute Respiratory failure, obtundation  HISTORY OF PRESENT ILLNESS:   70 yo woman, hx COPD chronic hypoxemia, schizophrenia/ bipolar d/o. S/p mechanical fall, ? Whether she experienced syncope as this was unwitnessed. Has L femur fracture. Evolving hypercapneic resp failure in setting narcotics. Intubated on 6/25 am. Extubated 6/27  SUBJECTIVE:  Tachy overnight Agitation given haldol Afebrile More hypoxic -86% on 2L   VITAL SIGNS: BP 154/100 mmHg  Pulse 96  Temp(Src) 98.1 F (36.7 C) (Oral)  Resp 24  Ht '5\' 6"'$  (1.676 m)  Wt 108 lb 14.5 oz (49.4 kg)  BMI 17.59 kg/m2  SpO2 97%  HEMODYNAMICS:    VENTILATOR SETTINGS: Vent Mode:  [-] PSV;CPAP FiO2 (%):  [40 %] 40 % PEEP:  [5 cmH20] 5 cmH20 Pressure Support:  [5 cmH20] 5 cmH20  INTAKE / OUTPUT: I/O last 3 completed shifts: In: 2925 [I.V.:2225; Other:700] Out: 3450 [Urine:3450]  PHYSICAL EXAMINATION: General:  Elderly frail woman,  Neuro:  interactive ,mild confusion follows commands HEENT:  OP dry, PERRL, edentulous  Cardiovascular:  Regular, no M,  Lungs:  Very distant BS BL Abdomen:  Soft, benign Musculoskeletal:  L LE is in traction Skin:  No rash  LABS:  BMET  Recent Labs Lab 01/10/16 0029 01/10/16 0249 01/11/16 0300  NA 142 142 137  K 4.1 4.4 4.3  CL 105 104 98*  CO2 32 32 30  BUN '18 16 17  '$ CREATININE 0.52 0.54 0.54  GLUCOSE 130* 128* 105*    Electrolytes  Recent Labs Lab 01/10/16 0029 01/10/16 0249 01/11/16 0300  CALCIUM 9.2 9.4 9.2  MG 1.9  --   --     CBC  Recent Labs Lab 01/09/16 0324 01/10/16 0249 01/11/16 0300  WBC 11.0* 11.1* 11.6*  HGB 8.9* 8.2* 8.5*  HCT 29.6* 26.6* 27.8*  PLT 237 201 207    Coag's  Recent Labs Lab 01/08/16 0005  APTT 32  INR 1.09    Sepsis  Markers No results for input(s): LATICACIDVEN, PROCALCITON, O2SATVEN in the last 168 hours.  ABG  Recent Labs Lab 01/08/16 1208 01/09/16 0918 01/10/16 1058  PHART 7.361 7.273* 7.399  PCO2ART 68.5* 66.2* 54.0*  PO2ART 184.0* 97.0 124.0*    Liver Enzymes No results for input(s): AST, ALT, ALKPHOS, BILITOT, ALBUMIN in the last 168 hours.  Cardiac Enzymes  Recent Labs Lab 01/08/16 0005  TROPONINI <0.03    Glucose  Recent Labs Lab 01/10/16 0755 01/10/16 1138 01/10/16 1619 01/10/16 2001 01/11/16 0101 01/11/16 0505  GLUCAP 112* 122* 119* 115* 114* 110*    Imaging Dg Chest Port 1 View  01/10/2016  CLINICAL DATA:  Acute hypoxemic respiratory failure ([place]) [id number] EXAM: PORTABLE CHEST 1 VIEW COMPARISON:  01/08/2016 FINDINGS: Endotracheal tube is in place tip 1.6 above the carina. Nasogastric tube is in place with tip off the film beyond the gastroesophageal junction. The heart size is normal. There is minimal bibasilar atelectasis. No pulmonary edema. IMPRESSION: 1. Endotracheal tube tip is just above the carina. 2. Minimal bibasilar atelectasis. Electronically Signed   By: Nolon Nations M.D.   On: 01/10/2016 09:37     STUDIES:  L LE XR 6/25 >> L femoral fracture, displaced and shortened Head CT 6/25 >> no acute bleed or CVA CT c spine 6/25 >> no cervical spine  injury  CULTURES:  ANTIBIOTICS: Cefazolin prophylactic for SGY 6/25  SIGNIFICANT EVENTS: S/p fall and L hip fx 6/25 ET intubated for hypercapneic resp failure 6/25 6/26 agitation >>ativan 6/27 agitation , tachy >> lopressor, ativan  LINES/TUBES: ETT 6/25 >> 6/27  DISCUSSION: ICU delerium with underlying psych illness -Complicated by A fibn-RVR   ASSESSMENT / PLAN:  PULMONARY A: Acute on chronic hypercapneic and hypoxic resp failure Severe COPD with mild wheeze, unclear active bronchospasm P:   Drop solumedrol to 40 q 24 Scheduled duoneb Continue budesonide nebs   CARDIOVASCULAR A:   HTN ? Possible syncope, fall was unwitnessed SVT ? Runs of A fibn P:  Hold amlodipine Follow tele, doubt syncope workup  needed here.  Lopressor IV prn for rate control - not a candidate for anticoagulation due to fall  RENAL A:   hypokalemia P:   Follow BMP intermittently replete lytes as needed  GASTROINTESTINAL A:   SUP Protein calorie malnutrition P:   Advance PO  HEMATOLOGIC A:   Leukocytosis Acute Blood loss anemia P:  Follow CBC Transfuse for Hb <7  INFECTIOUS A:   No evidence for active infxn at this time.  P:   Cefazolin prophylaxis for surgery  ENDOCRINE A:   At risk hyperglycemia P:   Ct SSI, especially while on steroids.   NEUROLOGIC A:   Obtundation, almost certainly due to hypercapnia.  Baseline schizophrenia and bipolar d/o.  P:   RASS goal: 0 Change to fentanyl prn  Haldol prn agitation- AVOID benzos Ct risperdal, lamictal, trazodone    FAMILY  - Updates: husband 6/27   - Inter-disciplinary family meet or Palliative Care meeting due by:  01/14/16   CC time 31 minutes   Kara Mead MD. [facility]. [facility] Pager [phone] If no response call [phone]    01/11/2016, 8:25 AM

## 2016-01-11 NOTE — Progress Notes (Signed)
CRITICAL VALUE ALERT  Critical value received:  Troponin 0.05  Date of notification:  01/11/16  Time of notification:  5465  Critical value read back:Yes.    Nurse who received alert:  Vern Claude  MD notified (1st page):  Dr. Elsworth Soho  Time of first page:  1050  Responding MD:  Dr. Elsworth Soho  Time MD responded:  14  Dr Elsworth Soho aware, no orders at this time.

## 2016-01-11 NOTE — Progress Notes (Signed)
eLink Physician-Brief Progress Note Patient Name: Caroline Hill DOB: 1945/08/04 MRN: [id number]   Date of Service  01/11/2016  HPI/Events of Note  Confusion/Agitation - Patient is trying to get out of bed. Request for bedside sitter.   eICU Interventions  Will order bedside sitter.     Intervention Category Minor Interventions: Agitation / anxiety - evaluation and management  Sommer,Steven Cornelia Copa 01/11/2016, 12:32 AM

## 2016-01-11 NOTE — Progress Notes (Signed)
eLink Physician-Brief Progress Note Patient Name: Caroline Hill DOB: May 17, 1946 MRN: [id number]   Date of Service  01/11/2016  HPI/Events of Note  Remains agitated. QTc interval = 0.4 seconds.  eICU Interventions  Will order: 1. Haldol 1 mg IV Q 6 hours PRN agitation. 2. Monitor QTc interval Q 6 hours. Notify MD if QTc interval > 500 milliseconds.     Intervention Category Minor Interventions: Agitation / anxiety - evaluation and management  Sommer,Steven Eugene 01/11/2016, 1:07 AM

## 2016-01-11 NOTE — Care Management Important Message (Signed)
Important Message  Patient Details  Name: Caroline Hill MRN: [id number] Date of Birth: 03-23-1946   Medicare Important Message Given:  Yes    Loann Quill 01/11/2016, 9:53 AM

## 2016-01-11 NOTE — Evaluation (Signed)
Physical Therapy Evaluation Patient Details Name: Caroline Hill MRN: [id number] DOB: 1945/11/04 Today's Date: 01/11/2016   History of Present Illness  Pt is 70 yo female s/p INTRAMEDULLARY (IM) RETROGRADE FEMORAL NAILING (Left). PMHx:dyspnea, COPD, HTN, anxiety, CAD, bipolar.  Clinical Impression  Patient is s/p above surgery resulting in functional limitations due to the deficits listed below (see PT Problem List). Pt unable to keep PWB status so limited mobility to transfers on eval. She would be able to ambulate if WB status WBAT, left sticky note for Dr. Ninfa Linden to clarify, will otherwise keep her at transfer level.  Patient will benefit from skilled PT to increase their independence and safety with mobility to allow discharge to the venue listed below.       Follow Up Recommendations SNF;Supervision/Assistance - 24 hour    Equipment Recommendations  None recommended by PT    Recommendations for Other Services       Precautions / Restrictions Precautions Precautions: Fall Restrictions Weight Bearing Restrictions: Yes LLE Weight Bearing: Partial weight bearing LLE Partial Weight Bearing Percentage or Pounds: Not more than 25%      Mobility  Bed Mobility Overal bed mobility: Needs Assistance Bed Mobility: Supine to Sit     Supine to sit: Supervision     General bed mobility comments: pt able to sit straight up in bed, throwing both legs off bed despite sues to wait  Transfers Overall transfer level: Needs assistance Equipment used: Rolling walker (2 wheeled) Transfers: Sit to/from [facility] Sit to Stand: Min assist Stand pivot transfers: Mod assist;+2 physical assistance       General transfer comment: min A for safety with standing, pt unable to comprehend and keep PWB on LLE. Pt unsafe with RW and atttempted sitting before turned all the way to chair, mod A for safety  Ambulation/Gait             General Gait Details: NT because pt  unable to keep [facility]  Stairs            Wheelchair Mobility    Modified Rankin (Stroke Patients Only)       Balance Overall balance assessment: Needs assistance Sitting-balance support: No upper extremity supported Sitting balance-Leahy Scale: Fair     Standing balance support: Bilateral upper extremity supported Standing balance-Leahy Scale: Poor Standing balance comment: needs UE support to stand safely                             Pertinent Vitals/Pain Pain Assessment: Faces Faces Pain Scale: Hurts little more Pain Location: LLE Pain Descriptors / Indicators: Grimacing Pain Intervention(s): Limited activity within patient's tolerance;Monitored during session;Repositioned    Home Living Family/patient expects to be discharged to:: Skilled nursing facility Living Arrangements: Spouse/significant other                    Prior Function Level of Independence: Independent         Comments: husband reports that pt's current mental status is typical for her after surgery/ in the hospital but that she returns to baseline at hom     Hand Dominance   Dominant Hand: Right    Extremity/Trunk Assessment   Upper Extremity Assessment: Defer to OT evaluation           Lower Extremity Assessment: LLE deficits/detail;RLE deficits/detail RLE Deficits / Details: WFL LLE Deficits / Details: pt more guarded with mvmt of left than right but  demonstrated left hip flex and knee flex/ ext against gravity, difficult to further assess due to cognition and pt not following commands consistently  Cervical / Trunk Assessment: Normal  Communication   Communication: No difficulties  Cognition Arousal/Alertness: Awake/alert Behavior During Therapy: Impulsive;Restless;Anxious Overall Cognitive Status: Impaired/Different from baseline Area of Impairment: Orientation;Following commands;Safety/judgement;Problem solving Orientation Level: Disoriented to;Place    Memory: Decreased short-term memory Following Commands: Follows one step commands inconsistently Safety/Judgement: Decreased awareness of safety;Decreased awareness of deficits   Problem Solving: Difficulty sequencing;Requires verbal cues;Requires tactile cues General Comments: pt very fidgety in bed and chair, trying to get up repeatedly before everything ready    General Comments General comments (skin integrity, edema, etc.): pt given "busy belt" once in chair to decrease pulling on lines, also with chair alarm, and sitter present with husband    Exercises General Exercises - Lower Extremity Ankle Circles/Pumps: AROM;Both;20 reps;Seated      Assessment/Plan    PT Assessment Patient needs continued PT services  PT Diagnosis Difficulty walking;Acute pain;Abnormality of gait;Altered mental status   PT Problem List Decreased strength;Decreased balance;Decreased mobility;Decreased cognition;Decreased knowledge of use of DME;Decreased safety awareness;Decreased knowledge of precautions;Decreased skin integrity  PT Treatment Interventions DME instruction;Gait training;Functional mobility training;Therapeutic activities;Therapeutic exercise;Balance training;Cognitive remediation;Patient/family education   PT Goals (Current goals can be found in the Care Plan section) Acute Rehab PT Goals Patient Stated Goal: pt wanting to get OOB--difficult to keep her in bed due to her confusion PT Goal Formulation: With family Time For Goal Achievement: 01/25/16 Potential to Achieve Goals: Good    Frequency Min 3X/week   Barriers to discharge        Co-evaluation PT/OT/SLP Co-Evaluation/Treatment: Yes Reason for Co-Treatment: For patient/therapist safety;Necessary to address cognition/behavior during functional activity PT goals addressed during session: Mobility/safety with mobility;Balance;Proper use of DME OT goals addressed during session: ADL's and self-care;Strengthening/ROM       End  of Session Equipment Utilized During Treatment: Gait belt Activity Tolerance: Patient tolerated treatment well Patient left: in chair;with chair alarm set;with call bell/phone within reach;with family/visitor present;with nursing/sitter in room Nurse Communication: Mobility status         Time: 1151-1209 PT Time Calculation (min) (ACUTE ONLY): 18 min   Charges:   PT Evaluation $PT Eval Moderate Complexity: 1 Procedure     PT G Codes:       Leighton Roach, PT  Acute Rehab Services  [place], [place] 01/11/2016, 2:11 PM

## 2016-01-11 NOTE — Evaluation (Signed)
Occupational Therapy Evaluation Patient Details Name: Caroline Hill MRN: [id number] DOB: [birth year] Today's Date: 01/11/2016    History of Present Illness Pt is 70 yo female s/p INTRAMEDULLARY (IM) RETROGRADE FEMORAL NAILING (Left). PMHx:dyspnea, COPD, HTN, anxiety, CAD, bipolar.   Clinical Impression   This 71 yo female admitted and underwent above presents to acute OT with deficits below affecting her PLOF with basic ADLs. She will benefit from acute OT with follow up OT at SNF to get back to a higher level of independent function.    Follow Up Recommendations  SNF    Equipment Recommendations   (TBD next venue)       Precautions / Restrictions Precautions Precautions: Fall Restrictions Weight Bearing Restrictions: Yes LLE Weight Bearing: Touchdown weight bearing LLE Partial Weight Bearing Percentage or Pounds: Not more than 25%      Mobility Bed Mobility Overal bed mobility: Needs Assistance Bed Mobility: Supine to Sit     Supine to sit: Supervision (for safety)        Transfers Overall transfer level: Needs assistance Equipment used: Rolling walker (2 wheeled) Transfers: Sit to/from [facility] Sit to Stand: Min assist Stand pivot transfers: Mod assist;+2 physical assistance            Balance Overall balance assessment: Needs assistance Sitting-balance support: No upper extremity supported;Feet supported Sitting balance-Leahy Scale: Fair     Standing balance support: Bilateral upper extremity supported Standing balance-Leahy Scale: Poor Standing balance comment: reliant on support for standing                            ADL Overall ADL's : Needs assistance/impaired Eating/Feeding: Set up;Supervision/ safety;Sitting   Grooming: Set up;Supervision/safety;Sitting   Upper Body Bathing: Set up;Supervision/ safety;Sitting   Lower Body Bathing: Minimal assistance;Sit to/from stand   Upper Body Dressing : Minimal  assistance;Sitting   Lower Body Dressing: Moderate assistance (min A sit<>stand)   Toilet Transfer: Moderate assistance;+2 for physical assistance;Stand-pivot;RW   Toileting- Clothing Manipulation and Hygiene: Minimal assistance;Sit to/from stand                         Pertinent Vitals/Pain Pain Assessment: Faces Faces Pain Scale: Hurts little more Pain Location: left LLE Pain Descriptors / Indicators: Grimacing Pain Intervention(s): Monitored during session;Repositioned     Hand Dominance Right   Extremity/Trunk Assessment Upper Extremity Assessment Upper Extremity Assessment: Overall WFL for tasks assessed   Lower Extremity Assessment Lower Extremity Assessment: Defer to PT evaluation       Communication Communication Communication: No difficulties   Cognition Arousal/Alertness: Awake/alert Behavior During Therapy: Impulsive;Restless;Anxious Overall Cognitive Status: Impaired/Different from baseline Area of Impairment: Orientation;Following commands;Safety/judgement;Problem solving Orientation Level: Disoriented to;Place     Following Commands:  (has to be held back to not do 1 step commands too quickly) Safety/Judgement: Decreased awareness of safety;Decreased awareness of deficits   Problem Solving: Difficulty sequencing;Requires verbal cues;Requires tactile cues                Home Living Family/patient expects to be discharged to:: Skilled nursing facility                                        Prior Functioning/Environment Level of Independence: Independent (per report)             OT Diagnosis:  Generalized weakness;Cognitive deficits   OT Problem List: Decreased strength;Decreased range of motion;Impaired balance (sitting and/or standing);Pain;Decreased knowledge of precautions;Decreased knowledge of use of DME or AE;Decreased safety awareness;Decreased cognition   OT Treatment/Interventions: Self-care/ADL  training;Patient/family education;Balance training;Therapeutic activities;DME and/or AE instruction;Cognitive remediation/compensation    OT Goals(Current goals can be found in the care plan section) Acute Rehab OT Goals Patient Stated Goal: pt wanting to get OOB--difficult to keep her in bed due to her confusion OT Goal Formulation: With family Time For Goal Achievement: 01/25/16 Potential to Achieve Goals: Fair  OT Frequency: Min 2X/week           Co-evaluation PT/OT/SLP Co-Evaluation/Treatment: Yes Reason for Co-Treatment: For patient/therapist safety   OT goals addressed during session: ADL's and self-care;Strengthening/ROM      End of Session Equipment Utilized During Treatment: Gait belt;Rolling walker;Oxygen (2 liters-- off for transfer) Nurse Communication: Mobility status (sitter in room and RN)  Activity Tolerance: Patient tolerated treatment well Patient left: in chair;with chair alarm set;with nursing/sitter in room;with family/visitor present (sitter)   Time: 6244-6950 OT Time Calculation (min): 18 min Charges:  OT General Charges $OT Visit: 1 Procedure OT Evaluation $OT Eval Moderate Complexity: 1 Procedure  Almon Register [phone] 01/11/2016, 12:29 PM

## 2016-01-11 NOTE — Progress Notes (Signed)
Nutrition Follow-up  DOCUMENTATION CODES:   Severe malnutrition in context of chronic illness  INTERVENTION:   Ensure Enlive po TID, each supplement provides 350 kcal and 20 grams of protein  NUTRITION DIAGNOSIS:   Malnutrition (Severe) related to chronic illness (severe COPD) as evidenced by severe depletion of body fat, severe depletion of muscle mass. Ongoing.   GOAL:   Patient will meet greater than or equal to 90% of their needs Progressing.   MONITOR:   PO intake, Supplement acceptance, I & O's, Weight trends  ASSESSMENT:   Pt with a past medical history significant for severe COPD on home O2 and still smokes, CAD, and HTN who presents with fall and left femur fx.   6/25 s/p IM nailing of left femur 6/27 extubated Medications reviewed and include: colace, solu-medrol Labs reviewed: CBG's: 110  Pt seemed confused. Husband and sitter at bedside. Husband reports that pt eats eggs in the morning, nabs for lunch, and a good dinner at night. Still smokes on the front porch. When she gets short of breath she comes inside to put on her O2.   Diet Order:  Diet 2 gram sodium Room service appropriate?: Yes; Fluid consistency:: Thin  Skin:  Wound (see comment) (closed incision on left leg)  Last BM:  6/24  Height:   Ht Readings from Last 1 Encounters:  01/08/16 '5\' 6"'$  (1.676 m)    Weight:   Wt Readings from Last 1 Encounters:  01/11/16 108 lb 14.5 oz (49.4 kg)    Ideal Body Weight:  59.09 kg  BMI:  Body mass index is 17.59 kg/(m^2).  Estimated Nutritional Needs:   Kcal:  1500-1700  Protein:  75-85 grams  Fluid:  > 1.5 L/day  EDUCATION NEEDS:   No education needs identified at this time  [place], [place], [place] Pager [phone] After Hours Pager

## 2016-01-11 NOTE — Progress Notes (Signed)
Pt increasingly restless. MD notified and will order safety sitter and anti-anxiety medication.

## 2016-01-11 NOTE — Progress Notes (Signed)
eLink Physician-Brief Progress Note Patient Name: Caroline Hill DOB: August 22, 1945 MRN: [id number]   Date of Service  01/11/2016  HPI/Events of Note  Tachycardia - Appears to be sinus tachcardia.   eICU Interventions  Will order: 1. Bolus with 0.9 NaCl 1 liter IV over 1 hour now.  2. EKG now.     Intervention Category Intermediate Interventions: Arrhythmia - evaluation and management  Sommer,Steven Eugene 01/11/2016, 2:32 AM

## 2016-01-12 DIAGNOSIS — J9601 Acute respiratory failure with hypoxia: Secondary | ICD-10-CM | POA: Diagnosis present

## 2016-01-12 DIAGNOSIS — E46 Unspecified protein-calorie malnutrition: Secondary | ICD-10-CM | POA: Diagnosis present

## 2016-01-12 DIAGNOSIS — S72402A Unspecified fracture of lower end of left femur, initial encounter for closed fracture: Secondary | ICD-10-CM

## 2016-01-12 LAB — CBC
HCT: 30.4 % — ABNORMAL LOW (ref 36.0–46.0)
HEMOGLOBIN: 9.8 g/dL — AB (ref 12.0–15.0)
MCH: 29 pg (ref 26.0–34.0)
MCHC: 32.2 g/dL (ref 30.0–36.0)
MCV: 89.9 fL (ref 78.0–100.0)
Platelets: 236 10*3/uL (ref 150–400)
RBC: 3.38 MIL/uL — AB (ref 3.87–5.11)
RDW: 14.2 % (ref 11.5–15.5)
WBC: 8.3 10*3/uL (ref 4.0–10.5)

## 2016-01-12 LAB — GLUCOSE, CAPILLARY
GLUCOSE-CAPILLARY: 126 mg/dL — AB (ref 65–99)
Glucose-Capillary: 141 mg/dL — ABNORMAL HIGH (ref 65–99)
Glucose-Capillary: 103 mg/dL — ABNORMAL HIGH (ref 65–99)
Glucose-Capillary: 138 mg/dL — ABNORMAL HIGH (ref 65–99)
Glucose-Capillary: 100 mg/dL — ABNORMAL HIGH (ref 65–99)
Glucose-Capillary: 171 mg/dL — ABNORMAL HIGH (ref 65–99)

## 2016-01-12 LAB — BASIC METABOLIC PANEL
ANION GAP: 7 (ref 5–15)
BUN: 16 mg/dL (ref 6–20)
CALCIUM: 9.3 mg/dL (ref 8.9–10.3)
CO2: 36 mmol/L — AB (ref 22–32)
CREATININE: 0.5 mg/dL (ref 0.44–1.00)
Chloride: 96 mmol/L — ABNORMAL LOW (ref 101–111)
Glucose, Bld: 139 mg/dL — ABNORMAL HIGH (ref 65–99)
Potassium: 4.6 mmol/L (ref 3.5–5.1)
SODIUM: 139 mmol/L (ref 135–145)

## 2016-01-12 MED ORDER — IPRATROPIUM-ALBUTEROL 0.5-2.5 (3) MG/3ML IN SOLN
3.0000 mL | Freq: Two times a day (BID) | RESPIRATORY_TRACT | Status: DC
  Administered 2016-01-12 – 2016-01-14 (×4): 3 mL via RESPIRATORY_TRACT
  Filled 2016-01-12 (×4): qty 3

## 2016-01-12 MED ORDER — IPRATROPIUM-ALBUTEROL 0.5-2.5 (3) MG/3ML IN SOLN: 3.0000 mL | RESPIRATORY_TRACT | Status: DC | PRN

## 2016-01-12 NOTE — Progress Notes (Signed)
PROGRESS NOTE    Caroline Hill  MWU:[id number] DOB: 1946/05/12 DOA: 01/07/2016 PCP: [name], MD   Brief Narrative:  70 yo WF PMHx Bipolar disorder, unspecified , Depression, Schizophrenia,(she is actively followed by a mental health specialist) COPD on home O2 3 L via [place] QHS/PRN, Tobacco abuse, CAD native artery, HTN  S/p mechanical fall, ? Whether she experienced syncope as this was unwitnessed. Has L femur fracture. Evolving hypercapneic resp failure in setting narcotics. Intubated on 6/25 am. Extubated 6/27   Assessment & Plan:   Principal Problem:   Closed displaced fracture of lower epiphysis of left femur Active Problems:   HTN (hypertension)   Tobacco abuse   Bipolar 1 disorder (HCC)   Chronic respiratory failure with hypoxia (HCC)   CAD in native artery   COPD (chronic obstructive pulmonary disease) (HCC)   Syncope   Acute respiratory failure (HCC)   Acute hypoxemic respiratory failure (HCC)   Protein-calorie malnutrition (HCC)   Left femur fracture -S/P Retrograde intramedullary nail placement, left femur - Out of bed to chair using overhead lift -Air mattress ensure on rotation at all times -PT/OT's: Recommend SNF  Acute on chronic hypercapneic and hypoxic resp failure -Resolved  Severe COPD with mild wheeze -Solumedrol to '40mg'$  daily -DuoNeb TID -Budesonide nebs BID  HTN -Metoprolol PRN  Syncope and collapse  -Echocardiogram pending  Protein calorie malnutrition -2 gm sodium diet  -Ensure  TID  Acute Blood loss anemia -Transfuse for Hb <7  -Baseline Schizophrenia and Bipolar d/o.      DVT prophylaxis: Lovenox Code Status: Full Family Communication: None Disposition Plan: Per orthopedic surgery   Consultants:  Dr.Christopher Kerry [place] orthopedic surgery    Procedures/Significant Events:  L LE XR 6/25 >> L femoral fracture, displaced and shortened 6/25 Head CT  >> no acute bleed or CVA 6/25 CT c spine >> no cervical spine  injury 6/25 ET intubated for hypercapneic resp failure 6/25 Retrograde intramedullary nail placement, left femur   Cultures NA  Antimicrobials: Cefazolin prophylactic for SGY 6/25   Devices NA   LINES / TUBES:  ETT 6/25 >> 6/27    Continuous Infusions: . sodium chloride Stopped (01/11/16 0230)     Subjective: 6/29  A/O 4, Appropriate pain in L LE.Lives alone  Objective: Filed Vitals:   01/12/16 1600 01/12/16 1654 01/12/16 1700 01/12/16 1800  BP: 126/74 126/74 146/91 131/65  Pulse: 103 108 130 116  Temp:  99.8 F (37.7 C)    TempSrc:  Oral    Resp: 29 32 30 32  Height:      Weight:      SpO2: 96% 97% 96% 96%    Intake/Output Summary (Last 24 hours) at 01/12/16 2006 Last data filed at 01/12/16 1500  Gross per 24 hour  Intake    597 ml  Output    850 ml  Net   -253 ml   Filed Weights   01/10/16 0402 01/11/16 0400 01/12/16 0352  Weight: 56 kg (123 lb 7.3 oz) 49.4 kg (108 lb 14.5 oz) 46.8 kg (103 lb 2.8 oz)    Examination:  General: A/O 4, Appropriate pain in L LE., No acute respiratory distress Eyes: negative scleral hemorrhage, negative anisocoria, negative icterus ENT: Negative Runny nose, negative gingival bleeding, Neck:  Negative scars, masses, torticollis, lymphadenopathy, JVD Lungs: Clear to auscultation bilaterally without wheezes or crackles Cardiovascular: Regular rate and rhythm without murmur gallop or rub normal S1 and S2 Abdomen: negative abdominal pain, nondistended, positive soft, bowel  sounds, no rebound, no ascites, no appreciable mass Extremities: No significant cyanosis, clubbing. LLE with surgical dressing consistent with hardware placement, negative warmth to touch, negative erythema, negative sign of infection. Skin: Negative rashes, lesions, ulcers, surgical dressing LE  Psychiatric:  Negative depression, negative anxiety, negative fatigue, negative mania  Central nervous system:  Cranial nerves II through XII intact, tongue/uvula  midline, all extremities muscle strength 5/5, sensation intact throughout,negative dysarthria, negative expressive aphasia, negative receptive aphasia.  .     Data Reviewed: Care during the described time interval was provided by me .  I have reviewed this patient's available data, including medical history, events of note, physical examination, and all test results as part of my evaluation. I have personally reviewed and interpreted all radiology studies.  CBC:  Recent Labs Lab 01/08/16 0005 01/08/16 0445 01/09/16 0324 01/10/16 0249 01/11/16 0300 01/12/16 0220  WBC 9.4 15.0* 11.0* 11.1* 11.6* 8.3  NEUTROABS 7.5  --   --   --   --   --   HGB 12.1 12.8 8.9* 8.2* 8.5* 9.8*  HCT 38.9 42.1 29.6* 26.6* 27.8* 30.4*  MCV 91.1 93.1 94.3 91.4 91.1 89.9  PLT 280 325 237 201 207 878   Basic Metabolic Panel:  Recent Labs Lab 01/09/16 0324 01/10/16 0029 01/10/16 0249 01/11/16 0300 01/12/16 0220  NA 139 142 142 137 139  K 3.5 4.1 4.4 4.3 4.6  CL 103 105 104 98* 96*  CO2 31 32 32 30 36*  GLUCOSE 163* 130* 128* 105* 139*  BUN 21* '18 16 17 16  '$ CREATININE 0.88 0.52 0.54 0.54 0.50  CALCIUM 8.3* 9.2 9.4 9.2 9.3  MG  --  1.9  --   --   --    GFR: Estimated Creatinine Clearance: 49 mL/min (by C-G formula based on Cr of 0.5). Liver Function Tests: No results for input(s): AST, ALT, ALKPHOS, BILITOT, PROT, ALBUMIN in the last 168 hours. No results for input(s): LIPASE, AMYLASE in the last 168 hours. No results for input(s): AMMONIA in the last 168 hours. Coagulation Profile:  Recent Labs Lab 01/08/16 0005  INR 1.09   Cardiac Enzymes:  Recent Labs Lab 01/08/16 0005 01/11/16 0855  TROPONINI <0.03 0.05*   BNP (last 3 results) No results for input(s): PROBNP in the last 8760 hours. HbA1C: No results for input(s): HGBA1C in the last 72 hours. CBG:  Recent Labs Lab 01/11/16 2356 01/12/16 0356 01/12/16 0751 01/12/16 1205 01/12/16 1658  GLUCAP 126* 141* 103* 138* 100*    Lipid Profile: No results for input(s): CHOL, HDL, LDLCALC, TRIG, CHOLHDL, LDLDIRECT in the last 72 hours. Thyroid Function Tests: No results for input(s): TSH, T4TOTAL, FREET4, T3FREE, THYROIDAB in the last 72 hours. Anemia Panel: No results for input(s): VITAMINB12, FOLATE, FERRITIN, TIBC, IRON, RETICCTPCT in the last 72 hours. Urine analysis:    Component Value Date/Time   COLORURINE YELLOW 01/08/2016 0355   APPEARANCEUR CLOUDY* 01/08/2016 0355   LABSPEC 1.013 01/08/2016 0355   PHURINE 7.5 01/08/2016 0355   GLUCOSEU NEGATIVE 01/08/2016 0355   HGBUR NEGATIVE 01/08/2016 0355   BILIRUBINUR NEGATIVE 01/08/2016 0355   KETONESUR NEGATIVE 01/08/2016 0355   PROTEINUR NEGATIVE 01/08/2016 0355   NITRITE POSITIVE* 01/08/2016 0355   LEUKOCYTESUR NEGATIVE 01/08/2016 0355   Sepsis Labs: '@LABRCNTIP'$ (procalcitonin:4,lacticidven:4)  ) Recent Results (from the past 240 hour(s))  MRSA PCR Screening     Status: None   Collection Time: 01/08/16  5:28 AM  Result Value Ref Range Status   MRSA by PCR  NEGATIVE NEGATIVE Final    Comment:        The GeneXpert MRSA Assay (FDA approved for NASAL specimens only), is one component of a comprehensive MRSA colonization surveillance program. It is not intended to diagnose MRSA infection nor to guide or monitor treatment for MRSA infections.   Surgical pcr screen     Status: Abnormal   Collection Time: 01/08/16  9:32 AM  Result Value Ref Range Status   MRSA, PCR NEGATIVE NEGATIVE Final   Staphylococcus aureus POSITIVE (A) NEGATIVE Final    Comment:        The Xpert SA Assay (FDA approved for NASAL specimens in patients over 71 years of age), is one component of a comprehensive surveillance program.  Test performance has been validated by [facility] for patients greater than or equal to 51 year old. It is not intended to diagnose infection nor to guide or monitor treatment.          Radiology Studies: Dg Chest Port 1  View  01/11/2016  CLINICAL DATA:  Acute hypoxia, respiratory failure, history of COPD and coronary artery stent placement, current smoker. EXAM: PORTABLE CHEST 1 VIEW COMPARISON:  Portable chest x-ray of January 10, 2016 FINDINGS: The lungs are well-expanded. There is no focal infiltrate. There is no pleural effusion. The heart and pulmonary vascularity are normal. The mediastinum is normal in width. There is calcification in the wall of the thoracic aorta. The bony structures are osteopenic. IMPRESSION: COPD.  No CHF nor pneumonia.  Aortic atherosclerosis. Electronically Signed   By: David  Martinique M.D.   On: 01/11/2016 08:55        Scheduled Meds: . antiseptic oral rinse  7 mL Mouth Rinse q12n4p  . aspirin EC  325 mg Oral Q breakfast  . benztropine  0.5 mg Per Tube QHS  . budesonide (PULMICORT) nebulizer solution  0.5 mg Nebulization BID  . busPIRone  10 mg Per Tube BID  . docusate sodium  100 mg Oral BID  . enoxaparin (LOVENOX) injection  30 mg Subcutaneous Q24H  . feeding supplement (ENSURE ENLIVE)  237 mL Oral TID BM  . FLUoxetine  40 mg Per Tube q morning - 10a  . insulin aspart  0-20 Units Subcutaneous Q4H  . ipratropium-albuterol  3 mL Nebulization BID  . lamoTRIgine  100 mg Per Tube Q breakfast  . methylPREDNISolone (SOLU-MEDROL) injection  40 mg Intravenous Q24H  . pantoprazole sodium  40 mg Per Tube Daily  . risperiDONE  2 mg Per Tube QHS  . traZODone  50 mg Per Tube QHS   Continuous Infusions: . sodium chloride Stopped (01/11/16 0230)     LOS: 4 days    Time spent: 40 minutes    Shani Fitch, Geraldo Docker, MD [facility] Pager [phone]   If 7PM-7AM, please contact night-coverage [email or web address] Password [facility] 01/12/2016, 8:06 PM

## 2016-01-12 NOTE — Progress Notes (Addendum)
PULMONARY / CRITICAL CARE MEDICINE   Name: Caroline Hill MRN: [id number] DOB: 09/26/1945    ADMISSION DATE:  01/07/2016 CONSULTATION DATE:  01/08/16  REFERRING MD:  Dr Waldron Labs, [facility]  CHIEF COMPLAINT:  Acute Respiratory failure, obtundation  HISTORY OF PRESENT ILLNESS:   70 yo woman, hx COPD chronic hypoxemia, schizophrenia/ bipolar d/o. S/p mechanical fall, ? Whether she experienced syncope as this was unwitnessed. Has L femur fracture. Evolving hypercapneic resp failure in setting narcotics. Intubated on 6/25 am. Extubated 6/27  SUBJECTIVE:  Afebrile Down to 2L   VITAL SIGNS: BP 142/95 mmHg  Pulse 104  Temp(Src) 97.4 F (36.3 C) (Oral)  Resp 26  Ht '5\' 6"'$  (1.676 m)  Wt 103 lb 2.8 oz (46.8 kg)  BMI 16.66 kg/m2  SpO2 95%  HEMODYNAMICS:    VENTILATOR SETTINGS:    INTAKE / OUTPUT: I/O last 3 completed shifts: In: 2152 [P.O.:1077; I.V.:1075] Out: 4250 [Urine:4250]  PHYSICAL EXAMINATION: General:  Elderly frail woman,  Neuro:  interactive ,mild confusion, non focal HEENT:  OP dry, PERRL, edentulous  Cardiovascular:  Regular, no M,  Lungs:  Very distant BS BL Abdomen:  Soft, benign Musculoskeletal:  L LE is in traction Skin:  No rash  LABS:  BMET  Recent Labs Lab 01/10/16 0249 01/11/16 0300 01/12/16 0220  NA 142 137 139  K 4.4 4.3 4.6  CL 104 98* 96*  CO2 32 30 36*  BUN '16 17 16  '$ CREATININE 0.54 0.54 0.50  GLUCOSE 128* 105* 139*    Electrolytes  Recent Labs Lab 01/10/16 0029 01/10/16 0249 01/11/16 0300 01/12/16 0220  CALCIUM 9.2 9.4 9.2 9.3  MG 1.9  --   --   --     CBC  Recent Labs Lab 01/10/16 0249 01/11/16 0300 01/12/16 0220  WBC 11.1* 11.6* 8.3  HGB 8.2* 8.5* 9.8*  HCT 26.6* 27.8* 30.4*  PLT 201 207 236    Coag's  Recent Labs Lab 01/08/16 0005  APTT 32  INR 1.09    Sepsis Markers No results for input(s): LATICACIDVEN, PROCALCITON, O2SATVEN in the last 168 hours.  ABG  Recent Labs Lab 01/08/16 1208  01/09/16 0918 01/10/16 1058  PHART 7.361 7.273* 7.399  PCO2ART 68.5* 66.2* 54.0*  PO2ART 184.0* 97.0 124.0*    Liver Enzymes No results for input(s): AST, ALT, ALKPHOS, BILITOT, ALBUMIN in the last 168 hours.  Cardiac Enzymes  Recent Labs Lab 01/08/16 0005 01/11/16 0855  TROPONINI <0.03 0.05*    Glucose  Recent Labs Lab 01/11/16 1125 01/11/16 1509 01/11/16 2007 01/11/16 2356 01/12/16 0356 01/12/16 0751  GLUCAP 96 144* 198* 126* 141* 103*    Imaging No results found.   STUDIES:  L LE XR 6/25 >> L femoral fracture, displaced and shortened Head CT 6/25 >> no acute bleed or CVA CT c spine 6/25 >> no cervical spine injury  CULTURES:  ANTIBIOTICS: Cefazolin prophylactic for SGY 6/25  SIGNIFICANT EVENTS: S/p fall and L hip fx 6/25 ET intubated for hypercapneic resp failure 6/25 6/26 agitation >>ativan 6/27 agitation , tachy >> lopressor, ativan  LINES/TUBES: ETT 6/25 >> 6/27  DISCUSSION:  Much improved post extubation ICU delerium with underlying psych illness  -Complicated by A fibn-RVR   ASSESSMENT / PLAN:  PULMONARY A: Acute on chronic hypercapneic and hypoxic resp failure Severe COPD with mild wheeze, unclear active bronchospasm P:   Oral prednisone -taper over 1 week Scheduled duoneb Continue budesonide nebs I note spiriva & brovana on her outpt note from 05/2015 -can  resume on discharge   CARDIOVASCULAR A:  HTN ? Possible syncope, fall was unwitnessed SVT ? Runs of A fibn P:  Hold amlodipine Follow tele, doubt syncope workup  needed here.  Lopressor IV prn for rate control - not a candidate for anticoagulation due to fall   NEUROLOGIC A:   Obtundation, resolved , due to hypercapnia.  Baseline schizophrenia and bipolar d/o.  P:   Change to fentanyl prn  Haldol prn agitation- AVOID benzos Ct risperdal, lamictal, trazodone  PCCm available as needed Out pt FU with Dr [place] Bells for COPD Can transfer to tele  Kara Mead MD.  [facility] Pager [phone] If no response call [phone]   01/12/2016, 11:04 AM

## 2016-01-13 ENCOUNTER — Ambulatory Visit: Payer: Medicare HMO | Admitting: Internal Medicine

## 2016-01-13 ENCOUNTER — Inpatient Hospital Stay (HOSPITAL_COMMUNITY): Payer: Medicare HMO

## 2016-01-13 DIAGNOSIS — F203 Undifferentiated schizophrenia: Secondary | ICD-10-CM | POA: Diagnosis present

## 2016-01-13 DIAGNOSIS — R55 Syncope and collapse: Secondary | ICD-10-CM

## 2016-01-13 LAB — ECHOCARDIOGRAM COMPLETE
CHL CUP TV REG PEAK VELOCITY: 265 cm/s
EWDT: 239 ms
FS: 32 % (ref 28–44)
HEIGHTINCHES: 66 in
IV/PV OW: 1
LA diam end sys: 30 mm
LA vol A4C: 32.2 ml
LA vol: 43.8 mL
LADIAMINDEX: 1.95 cm/m2
LASIZE: 30 mm
LAVOLIN: 28.4 mL/m2
LV TDI E'MEDIAL: 7.18
LV e' LATERAL: 12.5 cm/s
LVOT VTI: 19 cm
LVOT area: 2.54 cm2
LVOT diameter: 18 mm
LVOT peak grad rest: 5 mmHg
LVOT peak vel: 114 cm/s
LVOTSV: 48 mL
MV Dec: 239
MVPKEVEL: 0.8 m/s
PW: 9 mm — AB (ref 0.6–1.1)
TAPSE: 23 mm
TDI e' lateral: 12.5
TRMAXVEL: 265 cm/s
Weight: 1731.93 oz

## 2016-01-13 LAB — GLUCOSE, CAPILLARY
GLUCOSE-CAPILLARY: 103 mg/dL — AB (ref 65–99)
GLUCOSE-CAPILLARY: 121 mg/dL — AB (ref 65–99)
Glucose-Capillary: 112 mg/dL — ABNORMAL HIGH (ref 65–99)
Glucose-Capillary: 113 mg/dL — ABNORMAL HIGH (ref 65–99)
Glucose-Capillary: 171 mg/dL — ABNORMAL HIGH (ref 65–99)
Glucose-Capillary: 175 mg/dL — ABNORMAL HIGH (ref 65–99)

## 2016-01-13 MED ORDER — METHYLPREDNISOLONE SODIUM SUCC 40 MG IJ SOLR
20.0000 mg | INTRAMUSCULAR | Status: DC
  Administered 2016-01-14: 20 mg via INTRAVENOUS
  Filled 2016-01-13: qty 1

## 2016-01-13 NOTE — Progress Notes (Signed)
Patient had a bed to transfer to 5W. Patient's heart rate was up to 130-140's. Gave prn metoprolol for HR greater than 115. [facility] transfer held off until HR stabilizes. [facility] aware.

## 2016-01-13 NOTE — Progress Notes (Signed)
Patient's heart rate came down to the 80's and patient was transferred to [place] room 21.

## 2016-01-13 NOTE — Progress Notes (Signed)
  Echocardiogram 2D Echocardiogram has been performed.  Caroline Hill 01/13/2016, 12:44 PM

## 2016-01-13 NOTE — Progress Notes (Signed)
PROGRESS NOTE    Caroline Hill  HCW:[id number] DOB: [birth year] DOA: 01/07/2016 PCP: [name], MD   Brief Narrative:  70 yo WF PMHx Bipolar disorder, unspecified , Depression, Schizophrenia,(she is actively followed by a mental health specialist) COPD on home O2 3 L via [place] QHS/PRN, Tobacco abuse, CAD native artery, HTN  S/p mechanical fall, ? Whether she experienced syncope as this was unwitnessed. Has L femur fracture. Evolving hypercapneic resp failure in setting narcotics. Intubated on 6/25 am. Extubated 6/27   Assessment & Plan:   Principal Problem:   Closed displaced fracture of lower epiphysis of left femur Active Problems:   HTN (hypertension)   Tobacco abuse   Bipolar 1 disorder (HCC)   Chronic respiratory failure with hypoxia (HCC)   CAD in native artery   COPD (chronic obstructive pulmonary disease) (HCC)   Syncope   Acute respiratory failure (HCC)   Acute hypoxemic respiratory failure (HCC)   Protein-calorie malnutrition ([place])   Undifferentiated schizophrenia ([place])   Left femur fracture -S/P Retrograde intramedullary nail placement, left femur - Out of bed to chair using overhead lift -Air mattress ensure on rotation at all times -PT/OT's: Recommend SNF. Awaiting CSW recommendations for accepting facility.  Acute on chronic hypercapneic and hypoxic resp failure -Resolved  Severe COPD with mild wheeze -Decrease Solumedrol to 20 mg daily -DuoNeb BID -Budesonide nebs BID  HTN -Metoprolol PRN  Syncope and collapse  -Echocardiogram; diastolic dysfunction see results below.  Protein calorie malnutrition -2 gm sodium diet  -Ensure  TID  Acute Blood loss anemia  Recent Labs Lab 01/08/16 0445 01/09/16 0324 01/10/16 0249 01/11/16 0300 01/12/16 0220  HGB 12.8 8.9* 8.2* 8.5* 9.8*  -Transfuse for Hb <7  -Baseline Schizophrenia and Bipolar d/o.  -Cogentin 0.5 mg daily -Buspirone 10 mg BID -Prozac 40 mg daily -Limited to 100 mg  daily -Risperdal 2 mg daily -Trazodone 50 mg daily    DVT prophylaxis: Lovenox Code Status: Full Family Communication: None Disposition Plan: Per orthopedic surgery   Consultants:  Dr.Christopher Kerry [place] orthopedic surgery    Procedures/Significant Events:  L LE XR 6/25 >> L femoral fracture, displaced and shortened 6/25 Head CT  >> no acute bleed or CVA 6/25 CT c spine >> no cervical spine injury 6/25 ET intubated for hypercapneic resp failure 6/25 Retrograde intramedullary nail placement, left femur 6/30 echocardiogram; - Left ventricle: -LVEF =55% to 60%. -(grade 1 diastolic dysfunction). - Pulmonary arteries: peak pressure: 31 mm Hg (S).   Cultures NA  Antimicrobials: Cefazolin prophylactic for SGY 6/25   Devices NA   LINES / TUBES:  ETT 6/25 >> 6/27    Continuous Infusions: . sodium chloride 50 mL/hr at 01/13/16 0940     Subjective: 6/30  A/O 4, Appropriate pain in LLE.Lives alone. States was in chair for several hours today and exercise leg    Objective: Filed Vitals:   01/13/16 1000 01/13/16 1200 01/13/16 1604 01/13/16 1700  BP: 115/61 108/65 114/78   Pulse: 108 96 98   Temp:  98.2 F (36.8 C)  99.1 F (37.3 C)  TempSrc:  Oral  Oral  Resp: 32 29 22   Height:      Weight:      SpO2: 95% 93% 95%     Intake/Output Summary (Last 24 hours) at 01/13/16 1843 Last data filed at 01/13/16 1800  Gross per 24 hour  Intake   1664 ml  Output    850 ml  Net    814 ml  Filed Weights   01/11/16 0400 01/12/16 0352 01/13/16 0500  Weight: 49.4 kg (108 lb 14.5 oz) 46.8 kg (103 lb 2.8 oz) 49.1 kg (108 lb 3.9 oz)    Examination:  General: A/O 4, Appropriate pain in L LE., No acute respiratory distress Eyes: negative scleral hemorrhage, negative anisocoria, negative icterus ENT: Negative Runny nose, negative gingival bleeding, Neck:  Negative scars, masses, torticollis, lymphadenopathy, JVD Lungs: Clear to auscultation bilaterally without  wheezes or crackles Cardiovascular: Regular rate and rhythm without murmur gallop or rub normal S1 and S2 Abdomen: negative abdominal pain, nondistended, positive soft, bowel sounds, no rebound, no ascites, no appreciable mass Extremities: No significant cyanosis, clubbing. LLE with surgical dressing consistent with hardware placement, negative warmth to touch, negative erythema, negative sign of infection. Skin: Negative rashes, lesions, ulcers, surgical dressing LE  Psychiatric:  Negative depression, negative anxiety, negative fatigue, negative mania  Central nervous system:  Cranial nerves II through XII intact, tongue/uvula midline, all extremities muscle strength 5/5, sensation intact throughout,negative dysarthria, negative expressive aphasia, negative receptive aphasia.  .     Data Reviewed: Care during the described time interval was provided by me .  I have reviewed this patient's available data, including medical history, events of note, physical examination, and all test results as part of my evaluation. I have personally reviewed and interpreted all radiology studies.  CBC:  Recent Labs Lab 01/08/16 0005 01/08/16 0445 01/09/16 0324 01/10/16 0249 01/11/16 0300 01/12/16 0220  WBC 9.4 15.0* 11.0* 11.1* 11.6* 8.3  NEUTROABS 7.5  --   --   --   --   --   HGB 12.1 12.8 8.9* 8.2* 8.5* 9.8*  HCT 38.9 42.1 29.6* 26.6* 27.8* 30.4*  MCV 91.1 93.1 94.3 91.4 91.1 89.9  PLT 280 325 237 201 207 536   Basic Metabolic Panel:  Recent Labs Lab 01/09/16 0324 01/10/16 0029 01/10/16 0249 01/11/16 0300 01/12/16 0220  NA 139 142 142 137 139  K 3.5 4.1 4.4 4.3 4.6  CL 103 105 104 98* 96*  CO2 31 32 32 30 36*  GLUCOSE 163* 130* 128* 105* 139*  BUN 21* '18 16 17 16  '$ CREATININE 0.88 0.52 0.54 0.54 0.50  CALCIUM 8.3* 9.2 9.4 9.2 9.3  MG  --  1.9  --   --   --    GFR: Estimated Creatinine Clearance: 51.4 mL/min (by C-G formula based on Cr of 0.5). Liver Function Tests: No results  for input(s): AST, ALT, ALKPHOS, BILITOT, PROT, ALBUMIN in the last 168 hours. No results for input(s): LIPASE, AMYLASE in the last 168 hours. No results for input(s): AMMONIA in the last 168 hours. Coagulation Profile:  Recent Labs Lab 01/08/16 0005  INR 1.09   Cardiac Enzymes:  Recent Labs Lab 01/08/16 0005 01/11/16 0855  TROPONINI <0.03 0.05*   BNP (last 3 results) No results for input(s): PROBNP in the last 8760 hours. HbA1C: No results for input(s): HGBA1C in the last 72 hours. CBG:  Recent Labs Lab 01/13/16 0020 01/13/16 0451 01/13/16 0833 01/13/16 1233 01/13/16 1705  GLUCAP 175* 113* 112* 103* 121*   Lipid Profile: No results for input(s): CHOL, HDL, LDLCALC, TRIG, CHOLHDL, LDLDIRECT in the last 72 hours. Thyroid Function Tests: No results for input(s): TSH, T4TOTAL, FREET4, T3FREE, THYROIDAB in the last 72 hours. Anemia Panel: No results for input(s): VITAMINB12, FOLATE, FERRITIN, TIBC, IRON, RETICCTPCT in the last 72 hours. Urine analysis:    Component Value Date/Time   COLORURINE YELLOW 01/08/2016 0355  APPEARANCEUR CLOUDY* 01/08/2016 0355   LABSPEC 1.013 01/08/2016 0355   PHURINE 7.5 01/08/2016 0355   GLUCOSEU NEGATIVE 01/08/2016 0355   HGBUR NEGATIVE 01/08/2016 0355   BILIRUBINUR NEGATIVE 01/08/2016 0355   KETONESUR NEGATIVE 01/08/2016 0355   PROTEINUR NEGATIVE 01/08/2016 0355   NITRITE POSITIVE* 01/08/2016 0355   LEUKOCYTESUR NEGATIVE 01/08/2016 0355   Sepsis Labs: '@LABRCNTIP'$ (procalcitonin:4,lacticidven:4)  ) Recent Results (from the past 240 hour(s))  MRSA PCR Screening     Status: None   Collection Time: 01/08/16  5:28 AM  Result Value Ref Range Status   MRSA by PCR NEGATIVE NEGATIVE Final    Comment:        The GeneXpert MRSA Assay (FDA approved for NASAL specimens only), is one component of a comprehensive MRSA colonization surveillance program. It is not intended to diagnose MRSA infection nor to guide or monitor treatment  for MRSA infections.   Surgical pcr screen     Status: Abnormal   Collection Time: 01/08/16  9:32 AM  Result Value Ref Range Status   MRSA, PCR NEGATIVE NEGATIVE Final   Staphylococcus aureus POSITIVE (A) NEGATIVE Final    Comment:        The Xpert SA Assay (FDA approved for NASAL specimens in patients over 17 years of age), is one component of a comprehensive surveillance program.  Test performance has been validated by [facility] for patients greater than or equal to 83 year old. It is not intended to diagnose infection nor to guide or monitor treatment.          Radiology Studies: No results found.      Scheduled Meds: . antiseptic oral rinse  7 mL Mouth Rinse q12n4p  . aspirin EC  325 mg Oral Q breakfast  . benztropine  0.5 mg Per Tube QHS  . budesonide (PULMICORT) nebulizer solution  0.5 mg Nebulization BID  . busPIRone  10 mg Per Tube BID  . docusate sodium  100 mg Oral BID  . enoxaparin (LOVENOX) injection  30 mg Subcutaneous Q24H  . feeding supplement (ENSURE ENLIVE)  237 mL Oral TID BM  . FLUoxetine  40 mg Per Tube q morning - 10a  . insulin aspart  0-20 Units Subcutaneous Q4H  . ipratropium-albuterol  3 mL Nebulization BID  . lamoTRIgine  100 mg Per Tube Q breakfast  . [START ON 01/14/2016] methylPREDNISolone (SOLU-MEDROL) injection  20 mg Intravenous Q24H  . pantoprazole sodium  40 mg Per Tube Daily  . risperiDONE  2 mg Per Tube QHS  . traZODone  50 mg Per Tube QHS   Continuous Infusions: . sodium chloride 50 mL/hr at 01/13/16 0940     LOS: 5 days    Time spent: 40 minutes    Amauri Medellin, Geraldo Docker, MD [facility] Pager [phone]   If 7PM-7AM, please contact night-coverage [email or web address] Password [facility] 01/13/2016, 6:43 PM

## 2016-01-13 NOTE — Progress Notes (Signed)
Physical Therapy Treatment Patient Details Name: Caroline Hill MRN: [id number] DOB: 11-08-1945 Today's Date: 01/13/2016    History of Present Illness Pt is 70 yo female s/p INTRAMEDULLARY (IM) RETROGRADE FEMORAL NAILING (Left). PMHx:dyspnea, COPD, HTN, anxiety, CAD, bipolar.    PT Comments    Although some improvement, Caroline Hill continues to [employer] greater than 25% through Lt LE w/ pivot transfers. Emphasis placed on utilizing Bil UE strength through RW to avoid this; however, pt has difficulty complying.  Therefore, limited mobility to pivot transfer.  Therapeutic exercises completed once pt sitting in recliner chair.   Follow Up Recommendations  SNF;Supervision/Assistance - 24 hour     Equipment Recommendations  None recommended by PT    Recommendations for Other Services       Precautions / Restrictions Precautions Precautions: Fall Restrictions Weight Bearing Restrictions: Yes LLE Weight Bearing: Partial weight bearing LLE Partial Weight Bearing Percentage or Pounds: Not more than 25%    Mobility  Bed Mobility Overal bed mobility: Needs Assistance Bed Mobility: Supine to Sit     Supine to sit: Supervision;HOB elevated     General bed mobility comments: pt able to sit straight up in bed, requires multiple cues to wait until her lines are in order.    Transfers Overall transfer level: Needs assistance Equipment used: Rolling walker (2 wheeled) Transfers: Sit to/from [facility] Sit to Stand: Min assist Stand pivot transfers: +2 physical assistance;Min assist       General transfer comment: Min assist to steady and for cues for hand placement.  Pt appeared to do better w/ WBing today but still WBing over 25%.  Assist to manage RW during pivot and max verbal directional cues.  Stand pivot to [facility] and to chair.  Ambulation/Gait             General Gait Details: NT because pt unable to keep [facility]   Stairs            Wheelchair Mobility     Modified Rankin (Stroke Patients Only)       Balance Overall balance assessment: Needs assistance Sitting-balance support: No upper extremity supported;Feet supported Sitting balance-Leahy Scale: Good     Standing balance support: Bilateral upper extremity supported;During functional activity Standing balance-Leahy Scale: Poor Standing balance comment: Relies on UE support                     Cognition Arousal/Alertness: Awake/alert Behavior During Therapy: Impulsive;Restless;Anxious Overall Cognitive Status: Impaired/Different from baseline Area of Impairment: Orientation;Following commands;Safety/judgement;Problem solving Orientation Level: Disoriented to;Time   Memory: Decreased short-term memory Following Commands: Follows one step commands inconsistently Safety/Judgement: Decreased awareness of safety;Decreased awareness of deficits   Problem Solving: Difficulty sequencing;Requires verbal cues;Requires tactile cues General Comments: pt very fidgety in bed and chair, trying to get up repeatedly before everything ready and pulling at lines    Exercises General Exercises - Lower Extremity Ankle Circles/Pumps: AROM;Both;Seated;10 reps Quad Sets: Strengthening;Both;10 reps;Seated Hip ABduction/ADduction: AROM;Left;5 reps;Seated Straight Leg Raises: AROM;Left;5 reps;Seated    General Comments General comments (skin integrity, edema, etc.): Reinforced to pt that she needs to have staff w/ her when she is OOB or chair.  Husband present at end of session.      Pertinent Vitals/Pain Pain Assessment: Faces Faces Pain Scale: Hurts little more Pain Location: Lt LE Pain Descriptors / Indicators: Aching;Discomfort Pain Intervention(s): Limited activity within patient's tolerance;Monitored during session;Repositioned    Home Living Family/patient expects to be discharged to:: Private residence  Living Arrangements: Spouse/significant other                   Prior Function            PT Goals (current goals can now be found in the care plan section) Acute Rehab PT Goals Patient Stated Goal: pt wanting to get OOB--seems confused about need to have assist for OOB activity PT Goal Formulation: With family Time For Goal Achievement: 01/25/16 Potential to Achieve Goals: Good Progress towards PT goals: Progressing toward goals    Frequency  Min 3X/week    PT Plan Current plan remains appropriate    Co-evaluation             End of Session Equipment Utilized During Treatment: Gait belt;Oxygen Activity Tolerance: Patient tolerated treatment well Patient left: in chair;with chair alarm set;with call bell/phone within reach;with family/visitor present     Time: 6122-4497 PT Time Calculation (min) (ACUTE ONLY): 23 min  Charges:  $Therapeutic Exercise: 8-22 mins $Therapeutic Activity: 8-22 mins                    G Codes:       Collie Siad PT, DPT  Pager: [phone] Phone: [phone] 01/13/2016, 12:55 PM

## 2016-01-14 ENCOUNTER — Inpatient Hospital Stay (HOSPITAL_COMMUNITY): Payer: Medicare HMO

## 2016-01-14 DIAGNOSIS — R0602 Shortness of breath: Secondary | ICD-10-CM | POA: Diagnosis present

## 2016-01-14 LAB — GLUCOSE, CAPILLARY
GLUCOSE-CAPILLARY: 106 mg/dL — AB (ref 65–99)
GLUCOSE-CAPILLARY: 119 mg/dL — AB (ref 65–99)
Glucose-Capillary: 138 mg/dL — ABNORMAL HIGH (ref 65–99)
Glucose-Capillary: 144 mg/dL — ABNORMAL HIGH (ref 65–99)
Glucose-Capillary: 147 mg/dL — ABNORMAL HIGH (ref 65–99)
Glucose-Capillary: 192 mg/dL — ABNORMAL HIGH (ref 65–99)
Glucose-Capillary: 90 mg/dL (ref 65–99)

## 2016-01-14 MED ORDER — IPRATROPIUM-ALBUTEROL 0.5-2.5 (3) MG/3ML IN SOLN
3.0000 mL | Freq: Four times a day (QID) | RESPIRATORY_TRACT | Status: DC
  Administered 2016-01-14 – 2016-01-15 (×6): 3 mL via RESPIRATORY_TRACT
  Filled 2016-01-14 (×6): qty 3

## 2016-01-14 MED ORDER — ZOLPIDEM TARTRATE 5 MG PO TABS: 5.0000 mg | ORAL_TABLET | Freq: Every evening | ORAL | Status: DC | PRN

## 2016-01-14 MED ORDER — METHOCARBAMOL 500 MG PO TABS: 500.0000 mg | ORAL_TABLET | Freq: Four times a day (QID) | ORAL | Status: DC | PRN

## 2016-01-14 MED ORDER — DM-GUAIFENESIN ER 30-600 MG PO TB12
1.0000 | ORAL_TABLET | Freq: Two times a day (BID) | ORAL | Status: DC
  Administered 2016-01-14 – 2016-01-16 (×4): 1 via ORAL
  Filled 2016-01-14 (×4): qty 1

## 2016-01-14 MED ORDER — METHYLPREDNISOLONE SODIUM SUCC 125 MG IJ SOLR
60.0000 mg | INTRAMUSCULAR | Status: DC
  Administered 2016-01-14 – 2016-01-15 (×2): 60 mg via INTRAVENOUS
  Filled 2016-01-14 (×2): qty 2

## 2016-01-14 NOTE — Clinical Social Work Placement (Signed)
   CLINICAL SOCIAL WORK PLACEMENT  NOTE  Date:  01/14/2016  Patient Details  Name: Caroline Hill MRN: [id number] Date of Birth: 03/19/1946  Clinical Social Work is seeking post-discharge placement for this patient at the [place] level of care (*CSW will initial, date and re-position this form in  chart as items are completed):  Yes   Patient/family provided with [place] Work Department's list of facilities offering this level of care within the geographic area requested by the patient (or if unable, by the patient's family).  Yes   Patient/family informed of their freedom to choose among providers that offer the needed level of care, that participate in Medicare, Medicaid or managed care program needed by the patient, have an available bed and are willing to accept the patient.  Yes   Patient/family informed of [facility]'s ownership interest in [facility] and [facility], as well as of the fact that they are under no obligation to receive care at these facilities.  PASRR submitted to EDS on 01/14/16     PASRR number received on       Existing PASRR number confirmed on       FL2 transmitted to all facilities in geographic area requested by pt/family on 01/14/16     FL2 transmitted to all facilities within larger geographic area on       Patient informed that his/her managed care company has contracts with or will negotiate with certain facilities, including the following:            Patient/family informed of bed offers received.  Patient chooses bed at       Physician recommends and patient chooses bed at      Patient to be transferred to   on  .  Patient to be transferred to facility by       Patient family notified on   of transfer.  Name of family member notified:        PHYSICIAN Please sign FL2     Additional Comment:    _______________________________________________ Candie Chroman, LCSW 01/14/2016, 4:26 PM

## 2016-01-14 NOTE — NC FL2 (Signed)
[place] MEDICAID FL2 LEVEL OF CARE SCREENING TOOL     IDENTIFICATION  Patient Name: Caroline Hill Birthdate: March 11, 1946 Sex: female Admission Date (Current Location): 01/07/2016  [facility] and [place] Number:  Herbalist and Address:  [facility], [place] [address] [place],  [address]      Provider Number: [id number]  Attending Physician Name and Address:  Allie Bossier, MD  Relative Name and Phone Number:       Current Level of Care: Hospital Recommended Level of Care: [place] Prior Approval Number:    Date Approved/Denied:   PASRR Number: Pending  Discharge Plan: SNF    Current Diagnoses: Patient Active Problem List   Diagnosis Date Noted  . Undifferentiated schizophrenia ([place])   . Acute hypoxemic respiratory failure ([place])   . Protein-calorie malnutrition ([place])   . Closed displaced fracture of lower epiphysis of left femur 01/08/2016  . Syncope 01/08/2016  . Acute respiratory failure ([place])   . Pulmonary hypertension ([place])   . COPD (chronic obstructive pulmonary disease) ([place]) 12/19/2015  . Acute encephalopathy 12/19/2015  . UTI (lower urinary tract infection) 12/19/2015  . CAD in native artery 11/25/2014  . COPD, severe ([place]) 01/19/2014  . Chronic respiratory failure with hypoxia ([place]) 01/19/2014  . Protein-calorie malnutrition, severe ([place]) 12/02/2013  . Bilateral carotid artery disease ([place]) 10/06/2013  . Mitral regurgitation 10/06/2013  . HTN (hypertension) 08/09/2011  . Arthritis 08/09/2011  . Anxiety and depression 08/09/2011  . Tobacco abuse 08/09/2011  . Marijuana abuse 08/09/2011  . Hyperlipidemia 08/09/2011  . Bipolar 1 disorder ([place]) 08/09/2011  . Left inguinal hernia 07/31/2011  . Chronic constipation 07/31/2011    Orientation RESPIRATION BLADDER Height & Weight     Self, Place  O2 (Nasal canula 1 L) Incontinent Weight: 108 lb 11 oz (49.3 kg) Height:  '5\' 6"'$  (167.6 cm)  BEHAVIORAL  SYMPTOMS/MOOD NEUROLOGICAL BOWEL NUTRITION STATUS   (None)  (None) Continent Diet (2 gram sodium)  AMBULATORY STATUS COMMUNICATION OF NEEDS Skin   Limited Assist Verbally Surgical wounds (Left leg. Closed incisions (2).)                       Personal Care Assistance Level of Assistance  Bathing, Feeding, Dressing Bathing Assistance: Limited assistance Feeding assistance: Limited assistance Dressing Assistance: Limited assistance     Functional Limitations Info  Sight, Hearing, Speech Sight Info: Adequate Hearing Info: Adequate Speech Info: Adequate    SPECIAL CARE FACTORS FREQUENCY  PT (By licensed PT), OT (By licensed OT), Blood pressure     PT Frequency: 5 x week OT Frequency: 5 x week            Contractures Contractures Info: Not present    Additional Factors Info  Code Status, Allergies, Psychotropic Code Status Info: Full Allergies Info: Oxycodone-acetaminophen, Oxycontin Psychotropic Info: Bipolar, Schizophrenia, Anxiety         Current Medications (01/14/2016):  This is the current hospital active medication list Current Facility-Administered Medications  Medication Dose Route Frequency Provider Last Rate Last Dose  . 0.9 %  sodium chloride infusion   Intravenous Continuous Collene Gobble, MD 50 mL/hr at 01/14/16 0154    . acetaminophen (TYLENOL) tablet 650 mg  650 mg Oral Q6H PRN Mcarthur Rossetti, MD   650 mg at 01/11/16 2103   Or  . acetaminophen (TYLENOL) suppository 650 mg  650 mg Rectal Q6H PRN Mcarthur Rossetti, MD      .  antiseptic oral rinse (CPC / CETYLPYRIDINIUM CHLORIDE 0.05%) solution 7 mL  7 mL Mouth Rinse q12n4p Silver Huguenin Elgergawy, MD   7 mL at 01/13/16 1600  . aspirin EC tablet 325 mg  325 mg Oral Q breakfast Mcarthur Rossetti, MD   325 mg at 01/14/16 1029  . benztropine (COGENTIN) tablet 0.5 mg  0.5 mg Per Tube QHS Collene Gobble, MD   0.5 mg at 01/13/16 2118  . bisacodyl (DULCOLAX) suppository 10 mg  10 mg Rectal Daily  PRN Edwin Dada, MD      . budesonide (PULMICORT) nebulizer solution 0.5 mg  0.5 mg Nebulization BID Rigoberto Noel, MD   0.5 mg at 01/14/16 0848  . busPIRone (BUSPAR) tablet 10 mg  10 mg Per Tube BID Collene Gobble, MD   10 mg at 01/14/16 1028  . docusate sodium (COLACE) capsule 100 mg  100 mg Oral BID Edwin Dada, MD   100 mg at 01/14/16 1028  . enoxaparin (LOVENOX) injection 30 mg  30 mg Subcutaneous Q24H Rigoberto Noel, MD   30 mg at 01/14/16 1029  . feeding supplement (ENSURE ENLIVE) (ENSURE ENLIVE) liquid 237 mL  237 mL Oral TID BM Rigoberto Noel, MD   237 mL at 01/14/16 1518  . fentaNYL (SUBLIMAZE) injection 25-50 mcg  25-50 mcg Intravenous Q2H PRN Rigoberto Noel, MD   25 mcg at 01/13/16 2018  . FLUoxetine (PROZAC) capsule 40 mg  40 mg Per Tube q morning - 10a Collene Gobble, MD   40 mg at 01/14/16 1028  . haloperidol lactate (HALDOL) injection 2-4 mg  2-4 mg Intravenous Q6H PRN Rigoberto Noel, MD      . insulin aspart (novoLOG) injection 0-20 Units  0-20 Units Subcutaneous Q4H Collene Gobble, MD   3 Units at 01/14/16 0052  . ipratropium-albuterol (DUONEB) 0.5-2.5 (3) MG/3ML nebulizer solution 3 mL  3 mL Nebulization BID Allie Bossier, MD   3 mL at 01/14/16 0843  . lamoTRIgine (LAMICTAL) tablet 100 mg  100 mg Per Tube Q breakfast Collene Gobble, MD   100 mg at 01/14/16 1028  . menthol-cetylpyridinium (CEPACOL) lozenge 3 mg  1 lozenge Oral PRN Mcarthur Rossetti, MD       Or  . phenol (CHLORASEPTIC) mouth spray 1 spray  1 spray Mouth/Throat PRN Mcarthur Rossetti, MD      . methocarbamol (ROBAXIN) tablet 500 mg  500 mg Oral Q6H PRN Mcarthur Rossetti, MD   500 mg at 01/12/16 1719   Or  . methocarbamol (ROBAXIN) 500 mg in dextrose 5 % 50 mL IVPB  500 mg Intravenous Q6H PRN Mcarthur Rossetti, MD      . methylPREDNISolone sodium succinate (SOLU-MEDROL) 40 mg/mL injection 20 mg  20 mg Intravenous Q24H Allie Bossier, MD      . metoCLOPramide (REGLAN) tablet 5-10  mg  5-10 mg Oral Q8H PRN Mcarthur Rossetti, MD       Or  . metoCLOPramide (REGLAN) injection 5-10 mg  5-10 mg Intravenous Q8H PRN Mcarthur Rossetti, MD      . metoprolol (LOPRESSOR) injection 2.5-5 mg  2.5-5 mg Intravenous Q3H PRN Wilhelmina Mcardle, MD   2.5 mg at 01/14/16 1517  . ondansetron (ZOFRAN) tablet 4 mg  4 mg Oral Q6H PRN Mcarthur Rossetti, MD       Or  . ondansetron [facility]) injection 4 mg  4 mg Intravenous Q6H PRN Harrell Gave  Kerry Fort, MD      . pantoprazole sodium (PROTONIX) 40 mg/20 mL oral suspension 40 mg  40 mg Per Tube Daily Collene Gobble, MD   40 mg at 01/13/16 5993  . polyethylene glycol (MIRALAX / GLYCOLAX) packet 17 g  17 g Oral Daily PRN Edwin Dada, MD   17 g at 01/14/16 1027  . risperiDONE (RISPERDAL) tablet 2 mg  2 mg Per Tube QHS Collene Gobble, MD   2 mg at 01/13/16 2116  . traZODone (DESYREL) tablet 50 mg  50 mg Per Tube QHS Collene Gobble, MD   50 mg at 01/13/16 2116     Discharge Medications: Please see discharge summary for a list of discharge medications.  Relevant Imaging Results:  Relevant Lab Results:   Additional Information SS#: [id number]  Candie Chroman, LCSW

## 2016-01-14 NOTE — Discharge Instructions (Signed)
Can get incisions wet daily in the shower and new dry dressings to left leg incisions daily. Non-weight bearing on her left leg except for transfers

## 2016-01-14 NOTE — Progress Notes (Signed)
PROGRESS NOTE    Caroline Hill  EQA:[id number] DOB: Aug 06, 1945 DOA: 01/07/2016 PCP: [name], MD   Brief Narrative:  70 yo WF PMHx Bipolar disorder, unspecified , Depression, Schizophrenia,(she is actively followed by a mental health specialist) COPD on home O2 3 L via [place] QHS/PRN, Tobacco abuse, CAD native artery, HTN  S/p mechanical fall, ? Whether she experienced syncope as this was unwitnessed. Has L femur fracture. Evolving hypercapneic resp failure in setting narcotics. Intubated on 6/25 am. Extubated 6/27   Assessment & Plan:   Principal Problem:   Closed displaced fracture of lower epiphysis of left femur Active Problems:   HTN (hypertension)   Tobacco abuse   Bipolar 1 disorder (HCC)   Chronic respiratory failure with hypoxia (HCC)   CAD in native artery   COPD (chronic obstructive pulmonary disease) (HCC)   Syncope   Acute respiratory failure (HCC)   Acute hypoxemic respiratory failure (HCC)   Protein-calorie malnutrition (HCC)   Undifferentiated schizophrenia (HCC)   SOB (shortness of breath)   Left femur fracture -S/P Retrograde intramedullary nail placement, left femur - Out of bed to chair using overhead lift -Air mattress ensure on rotation at all times -PT/OT's: Recommend SNF. 7/1 spoke with CSW Aaron Edelman who stated unfortunately paperwork had not begun yet, we'll attempt to have patient discharged in A.m (more likely on Monday).   Acute on chronic hypercapneic and hypoxic resp failure -Resolved  Severe COPD with mild wheeze -Will give patient increased steroid burst Solumedrol 60 mg daily (had been titrating patient down) -Increase DuoNeb QID -Budesonide nebs BID -Mucinex DM BID  HTN -No beta blockers secondary to patient's severe COPD  -If required would start on CCB or ACEI  Syncope and collapse  -Echocardiogram; diastolic dysfunction see results below.  Protein calorie malnutrition -2 gm sodium diet  -Ensure  TID  Acute Blood loss  anemia  Recent Labs Lab 01/08/16 0445 01/09/16 0324 01/10/16 0249 01/11/16 0300 01/12/16 0220  HGB 12.8 8.9* 8.2* 8.5* 9.8*  -Transfuse for Hb <7  -Baseline Schizophrenia and Bipolar d/o.  -Cogentin 0.5 mg daily -Buspirone 10 mg BID -Prozac 40 mg daily -Lamictal 100 mg daily -Risperdal 2 mg daily -Trazodone 50 mg daily  Insomnia -Ambien 5 mg QHS PRN    DVT prophylaxis: Lovenox Code Status: Full Family Communication: Son present at bedside Disposition Plan: Per orthopedic surgery   Consultants:  Dr.Christopher Kerry Fort orthopedic surgery    Procedures/Significant Events:  L LE XR 6/25 >> L femoral fracture, displaced and shortened 6/25 Head CT  >> no acute bleed or CVA 6/25 CT c spine >> no cervical spine injury 6/25 ET intubated for hypercapneic resp failure 6/25 Retrograde intramedullary nail placement, left femur 6/30 echocardiogram; - Left ventricle: -LVEF =55% to 60%. -(grade 1 diastolic dysfunction). - Pulmonary arteries: peak pressure: 31 mm Hg (S).   Cultures NA  Antimicrobials: Cefazolin prophylactic for SGY 6/25   Devices NA   LINES / TUBES:  ETT 6/25 >> 6/27    Continuous Infusions: . sodium chloride 50 mL/hr at 01/14/16 0154     Subjective: 7/1  A/O 4, Appropriate pain in LLE.Lives alone. States feels short of breath (ABG and SPO2 WNL). States only had one breathing treatment today.    Objective: Filed Vitals:   01/14/16 0458 01/14/16 0500 01/14/16 0843 01/14/16 1334  BP: 149/71   136/72  Pulse: 93   96  Temp: 98.8 F (37.1 C)   98.4 F (36.9 C)  TempSrc: Oral  Resp: 19   20  Height:      Weight:  49.3 kg (108 lb 11 oz)    SpO2: 97%  96% 96%    Intake/Output Summary (Last 24 hours) at 01/14/16 1736 Last data filed at 01/14/16 1550  Gross per 24 hour  Intake 1104.17 ml  Output    650 ml  Net 454.17 ml   Filed Weights   01/12/16 0352 01/13/16 0500 01/14/16 0500  Weight: 46.8 kg (103 lb 2.8 oz) 49.1 kg (108  lb 3.9 oz) 49.3 kg (108 lb 11 oz)    Examination:  General: A/O 4, Appropriate pain in L LE.,Cachectic, No acute respiratory distress(Feels SOB) Eyes: negative scleral hemorrhage, negative anisocoria, negative icterus ENT: Negative Runny nose, negative gingival bleeding, Neck:  Negative scars, masses, torticollis, lymphadenopathy, JVD Lungs: diffuse poor air movement, positive diffuse wheezing, negative crackles Cardiovascular: Tachycardic, Regular rhythm without murmur gallop or rub normal S1 and S2 Abdomen: negative abdominal pain, nondistended, positive soft, bowel sounds, no rebound, no ascites, no appreciable mass Extremities: No significant cyanosis, clubbing. LLE with surgical dressing consistent with hardware placement, negative warmth to touch, negative erythema, negative sign of infection. Skin: Negative rashes, lesions, ulcers, surgical dressing LE  Psychiatric:  Negative depression, negative anxiety, negative fatigue, negative mania  Central nervous system:  Cranial nerves II through XII intact, tongue/uvula midline, all extremities muscle strength 5/5, sensation intact throughout,negative dysarthria, negative expressive aphasia, negative receptive aphasia.  .     Data Reviewed: Care during the described time interval was provided by me .  I have reviewed this patient's available data, including medical history, events of note, physical examination, and all test results as part of my evaluation. I have personally reviewed and interpreted all radiology studies.  CBC:  Recent Labs Lab 01/08/16 0005 01/08/16 0445 01/09/16 0324 01/10/16 0249 01/11/16 0300 01/12/16 0220  WBC 9.4 15.0* 11.0* 11.1* 11.6* 8.3  NEUTROABS 7.5  --   --   --   --   --   HGB 12.1 12.8 8.9* 8.2* 8.5* 9.8*  HCT 38.9 42.1 29.6* 26.6* 27.8* 30.4*  MCV 91.1 93.1 94.3 91.4 91.1 89.9  PLT 280 325 237 201 207 387   Basic Metabolic Panel:  Recent Labs Lab 01/09/16 0324 01/10/16 0029 01/10/16 0249  01/11/16 0300 01/12/16 0220  NA 139 142 142 137 139  K 3.5 4.1 4.4 4.3 4.6  CL 103 105 104 98* 96*  CO2 31 32 32 30 36*  GLUCOSE 163* 130* 128* 105* 139*  BUN 21* '18 16 17 16  '$ CREATININE 0.88 0.52 0.54 0.54 0.50  CALCIUM 8.3* 9.2 9.4 9.2 9.3  MG  --  1.9  --   --   --    GFR: Estimated Creatinine Clearance: 51.7 mL/min (by C-G formula based on Cr of 0.5). Liver Function Tests: No results for input(s): AST, ALT, ALKPHOS, BILITOT, PROT, ALBUMIN in the last 168 hours. No results for input(s): LIPASE, AMYLASE in the last 168 hours. No results for input(s): AMMONIA in the last 168 hours. Coagulation Profile:  Recent Labs Lab 01/08/16 0005  INR 1.09   Cardiac Enzymes:  Recent Labs Lab 01/08/16 0005 01/11/16 0855  TROPONINI <0.03 0.05*   BNP (last 3 results) No results for input(s): PROBNP in the last 8760 hours. HbA1C: No results for input(s): HGBA1C in the last 72 hours. CBG:  Recent Labs Lab 01/14/16 0047 01/14/16 0405 01/14/16 0830 01/14/16 1146 01/14/16 1707  GLUCAP 144* 106* 90 119* 138*  Lipid Profile: No results for input(s): CHOL, HDL, LDLCALC, TRIG, CHOLHDL, LDLDIRECT in the last 72 hours. Thyroid Function Tests: No results for input(s): TSH, T4TOTAL, FREET4, T3FREE, THYROIDAB in the last 72 hours. Anemia Panel: No results for input(s): VITAMINB12, FOLATE, FERRITIN, TIBC, IRON, RETICCTPCT in the last 72 hours. Urine analysis:    Component Value Date/Time   COLORURINE YELLOW 01/08/2016 0355   APPEARANCEUR CLOUDY* 01/08/2016 0355   LABSPEC 1.013 01/08/2016 0355   PHURINE 7.5 01/08/2016 0355   GLUCOSEU NEGATIVE 01/08/2016 0355   HGBUR NEGATIVE 01/08/2016 0355   BILIRUBINUR NEGATIVE 01/08/2016 0355   KETONESUR NEGATIVE 01/08/2016 0355   PROTEINUR NEGATIVE 01/08/2016 0355   NITRITE POSITIVE* 01/08/2016 0355   LEUKOCYTESUR NEGATIVE 01/08/2016 0355   Sepsis Labs: '@LABRCNTIP'$ (procalcitonin:4,lacticidven:4)  ) Recent Results (from the past 240  hour(s))  MRSA PCR Screening     Status: None   Collection Time: 01/08/16  5:28 AM  Result Value Ref Range Status   MRSA by PCR NEGATIVE NEGATIVE Final    Comment:        The GeneXpert MRSA Assay (FDA approved for NASAL specimens only), is one component of a comprehensive MRSA colonization surveillance program. It is not intended to diagnose MRSA infection nor to guide or monitor treatment for MRSA infections.   Surgical pcr screen     Status: Abnormal   Collection Time: 01/08/16  9:32 AM  Result Value Ref Range Status   MRSA, PCR NEGATIVE NEGATIVE Final   Staphylococcus aureus POSITIVE (A) NEGATIVE Final    Comment:        The Xpert SA Assay (FDA approved for NASAL specimens in patients over 76 years of age), is one component of a comprehensive surveillance program.  Test performance has been validated by [facility] for patients greater than or equal to 103 year old. It is not intended to diagnose infection nor to guide or monitor treatment.          Radiology Studies: Dg Chest Port 1 View  01/14/2016  CLINICAL DATA:  Shortness of breath for 1 day EXAM: PORTABLE CHEST 1 VIEW COMPARISON:  01/11/2016 FINDINGS: The heart size and mediastinal contours are within normal limits. Both lungs are clear. The visualized skeletal structures are unremarkable. IMPRESSION: No active disease. Electronically Signed   By: Inez Catalina M.D.   On: 01/14/2016 15:46        Scheduled Meds: . antiseptic oral rinse  7 mL Mouth Rinse q12n4p  . aspirin EC  325 mg Oral Q breakfast  . benztropine  0.5 mg Per Tube QHS  . budesonide (PULMICORT) nebulizer solution  0.5 mg Nebulization BID  . busPIRone  10 mg Per Tube BID  . dextromethorphan-guaiFENesin  1 tablet Oral BID  . docusate sodium  100 mg Oral BID  . enoxaparin (LOVENOX) injection  30 mg Subcutaneous Q24H  . feeding supplement (ENSURE ENLIVE)  237 mL Oral TID BM  . FLUoxetine  40 mg Per Tube q morning - 10a  . insulin aspart   0-20 Units Subcutaneous Q4H  . ipratropium-albuterol  3 mL Nebulization QID  . lamoTRIgine  100 mg Per Tube Q breakfast  . methylPREDNISolone (SOLU-MEDROL) injection  60 mg Intravenous Q24H  . pantoprazole sodium  40 mg Per Tube Daily  . risperiDONE  2 mg Per Tube QHS  . traZODone  50 mg Per Tube QHS   Continuous Infusions: . sodium chloride 50 mL/hr at 01/14/16 0154     LOS: 6 days    Time  spent: 40 minutes    Trevell Pariseau, Geraldo Docker, MD [facility] Pager [phone]   If 7PM-7AM, please contact night-coverage [email or web address] Password [facility] 01/14/2016, 5:36 PM

## 2016-01-14 NOTE — Clinical Social Work Note (Signed)
Patient cannot discharge to SNF until PASRR number received. Possibly Monday. PASRR number under manual review due to mental health and past substance abuse history.  Caroline Hill, [name]

## 2016-01-14 NOTE — Clinical Social Work Note (Signed)
Clinical Social Work Assessment  Patient Details  Name: Caroline Hill MRN: [id number] Date of Birth: 1945-08-05  Date of referral:  01/13/16               Reason for consult:  Facility Placement, Discharge Planning                Permission sought to share information with:  Facility Sport and exercise psychologist, Family Supports Permission granted to share information::  Yes, Verbal Permission Granted  Name::     Actor::  SNF's  Relationship::  Spouse  Contact Information:  [phone]  Housing/Transportation Living arrangements for the past 2 months:  Single Family Home Source of Information:  Medical Team, Adult Children, Spouse Patient Interpreter Needed:  None Criminal Activity/Legal Involvement Pertinent to Current Situation/Hospitalization:  No - Comment as needed Significant Relationships:  Adult Children, Spouse Lives with:  Spouse Do you feel safe going back to the place where you live?  Yes Need for family participation in patient care:  Yes (Comment)  Care giving concerns:  PT recommending SNF once medically stable for discharge.   Social Worker assessment / plan:  Patient oriented only to person and place. CSW spoke with both patient's husband and son over the phone. CSW introduced role and explained that discharge planning would be discussed. Discussed PT recommendations for SNF once stable for discharge. Both husband and son are agreeable. Patient's husband wants her at a facility close to home. Discussed insurance barriers in that many facilities are not in contract with [facility] and the July 4th holiday. Both son and husband expressed understanding. Discussed possible need for LOG if we cannot get insurance authorization in time. No further concerns. CSW encouraged patient's husband and son to contact CSW as needed. CSW will continue to follow patient and her family and facilitate discharge to SNF once medically stable.  Employment status:  Retired Designer, industrial/product PT Recommendations:  [place] / Referral to community resources:  [place]  Patient/Family's Response to care:  Patient not fully oriented. Both patient's husband and son agreeable to SNF placement. Patient's family supportive and involved in patient's care. Patient's husband and son appreciated social work intervention.  Patient/Family's Understanding of and Emotional Response to Diagnosis, Current Treatment, and Prognosis:  Patient not fully oriented. Both patient's son and husband understand need for rehab before returning home.  Emotional Assessment Appearance:  Appears stated age Attitude/Demeanor/Rapport:  Unable to Assess Affect (typically observed):  Unable to Assess Orientation:  Oriented to Self, Oriented to Place Alcohol / Substance use:  Tobacco Use, Illicit Drugs Psych involvement (Current and /or in the community):  No (Comment)  Discharge Needs  Concerns to be addressed:  Care Coordination Readmission within the last 30 days:  Yes Current discharge risk:  Dependent with Mobility, Psychiatric Illness Barriers to Discharge:  [place], LCSW 01/14/2016, 4:21 PM

## 2016-01-14 NOTE — Progress Notes (Signed)
Paged on call provider with results of EKG.

## 2016-01-14 NOTE — Progress Notes (Signed)
Patient's heart rate was up to 140's. Gave prn metoprolol for HR greater than 115. Patient complaining of SOB. Stats 96% on 2L via [place]. Lung sounds clear bilateral. Vital signs stable no other complaints of pain. Dr. Clydene Laming made aware orders noted.

## 2016-01-14 NOTE — Progress Notes (Signed)
Patient ID: Caroline Hill, female   DOB: 03/16/1946, 70 y.o.   MRN: [id number] No acute changes.  She is now 6 days post IM nail to treat her left femur fracture.  The incisions all look good.  Having difficulty with 25% weight bearing, thus, I would have her non-weight bearing on her left leg except for transfers due to the soft nature of her bone.

## 2016-01-14 NOTE — Progress Notes (Signed)
Paged on call provider in regards to patient being tachycardic. Awaiting page . Patient currently resting and asymptomatic. Will continue to monitor.

## 2016-01-15 LAB — GLUCOSE, CAPILLARY
GLUCOSE-CAPILLARY: 151 mg/dL — AB (ref 65–99)
Glucose-Capillary: 116 mg/dL — ABNORMAL HIGH (ref 65–99)
Glucose-Capillary: 87 mg/dL (ref 65–99)
Glucose-Capillary: 106 mg/dL — ABNORMAL HIGH (ref 65–99)
Glucose-Capillary: 131 mg/dL — ABNORMAL HIGH (ref 65–99)
Glucose-Capillary: 186 mg/dL — ABNORMAL HIGH (ref 65–99)

## 2016-01-15 NOTE — Progress Notes (Signed)
PROGRESS NOTE    Caroline Hill  CVE:[id number] DOB: 03-02-1946 DOA: 01/07/2016 PCP: [name], MD   Brief Narrative:  70 yo WF PMHx Bipolar disorder, unspecified , Depression, Schizophrenia,(she is actively followed by a mental health specialist) COPD on home O2 3 L via [place] QHS/PRN, Tobacco abuse, CAD native artery, HTN  S/p mechanical fall, ? Whether she experienced syncope as this was unwitnessed. Has L femur fracture. Evolving hypercapneic resp failure in setting narcotics. Intubated on 6/25 am. Extubated 6/27   Assessment & Plan:   Principal Problem:   Closed displaced fracture of lower epiphysis of left femur Active Problems:   HTN (hypertension)   Tobacco abuse   Bipolar 1 disorder (HCC)   Chronic respiratory failure with hypoxia (HCC)   CAD in native artery   COPD (chronic obstructive pulmonary disease) (HCC)   Syncope   Acute respiratory failure (HCC)   Acute hypoxemic respiratory failure (HCC)   Protein-calorie malnutrition (HCC)   Undifferentiated schizophrenia (HCC)   SOB (shortness of breath)   Left femur fracture -S/P Retrograde intramedullary nail placement, left femur - Out of bed to chair using overhead lift -Air mattress ensure on rotation at all times -PT/OT's: Recommend SNF. 7/1 spoke with CSW Aaron Edelman who stated unfortunately paperwork had not begun yet, will attempt to have patient discharged in A.m (more likely on Monday).  -7/2 NOTE; CSW Aaron Edelman patient will be a placement problem secondary to requiring PASR for her psychiatric history.  Acute on chronic hypercapneic and hypoxic resp failure -Resolved  Severe COPD with mild wheeze -Will give patient increased steroid burst Solumedrol 60 mg daily (had been titrating patient down) -DuoNeb QID -Budesonide nebs BID -Mucinex DM BID  HTN -Currently controlled -No beta blockers secondary to patient's severe COPD  -If required would start on CCB or ACEI  Syncope and collapse  -Echocardiogram;  diastolic dysfunction see results below.  Protein calorie malnutrition -2 gm sodium diet  -Ensure  TID  Acute Blood loss anemia  Recent Labs Lab 01/09/16 0324 01/10/16 0249 01/11/16 0300 01/12/16 0220  HGB 8.9* 8.2* 8.5* 9.8*  -Transfuse for Hb <7  -Baseline Schizophrenia and Bipolar d/o.  -Cogentin 0.5 mg daily -Buspirone 10 mg BID -Prozac 40 mg daily -Lamictal 100 mg daily -Risperdal 2 mg daily -Trazodone 50 mg daily  Insomnia -Ambien 5 mg QHS PRN    DVT prophylaxis: Lovenox Code Status: Full Family Communication: Son present at bedside Disposition Plan: [place] patient will be a placement problem secondary to requiring PASR for her psychiatric history   Consultants:  Dr.Christopher [place] orthopedic surgery    Procedures/Significant Events:  L LE XR 6/25 >> L femoral fracture, displaced and shortened 6/25 Head CT  >> no acute bleed or CVA 6/25 CT c spine >> no cervical spine injury 6/25 ET intubated for hypercapneic resp failure 6/25 Retrograde intramedullary nail placement, left femur 6/30 echocardiogram; - Left ventricle: -LVEF =55% to 60%. -(grade 1 diastolic dysfunction). - Pulmonary arteries: peak pressure: 31 mm Hg (S).   Cultures NA  Antimicrobials: Cefazolin prophylactic for SGY 6/25   Devices NA   LINES / TUBES:  ETT 6/25 >> 6/27    Continuous Infusions: . sodium chloride 50 mL/hr (01/15/16 0743)     Subjective: 7/2  A/O 4, Appropriate pain in LLE.Lives alone. States feels short of breath (ABG and SPO2 WNL). States only had one breathing treatment today.    Objective: Filed Vitals:   01/15/16 1006 01/15/16 1010 01/15/16 1429 01/15/16 1530  BP:    107/52  Pulse:    104  Temp:    99 F (37.2 C)  TempSrc:      Resp:    20  Height:      Weight:      SpO2: 100% 100% 94% 97%    Intake/Output Summary (Last 24 hours) at 01/15/16 1656 Last data filed at 01/15/16 1100  Gross per 24 hour  Intake    240 ml  Output     200 ml  Net     40 ml   Filed Weights   01/12/16 0352 01/13/16 0500 01/14/16 0500  Weight: 46.8 kg (103 lb 2.8 oz) 49.1 kg (108 lb 3.9 oz) 49.3 kg (108 lb 11 oz)    Examination:  General: A/O 4, Appropriate pain in LLE.,Cachectic, No acute respiratory distress Eyes: negative scleral hemorrhage, negative anisocoria, negative icterus ENT: Negative Runny nose, negative gingival bleeding, Neck:  Negative scars, masses, torticollis, lymphadenopathy, JVD Lungs: diffuse poor air movement, positive diffuse mild wheezing, positive mild crackles Cardiovascular: Regular rhythm and rate without murmur gallop or rub normal S1 and S2 Abdomen: negative abdominal pain, nondistended, positive soft, bowel sounds, no rebound, no ascites, no appreciable mass Extremities: No significant cyanosis, clubbing. LLE with surgical dressing consistent with hardware placement, negative warmth to touch, negative erythema, negative sign of infection. Skin: Negative rashes, lesions, ulcers, surgical dressing LE  Psychiatric:  Negative depression, negative anxiety, negative fatigue, negative mania  Central nervous system:  Cranial nerves II through XII intact, tongue/uvula midline, all extremities muscle strength 5/5, sensation intact throughout,negative dysarthria, negative expressive aphasia, negative receptive aphasia.  .     Data Reviewed: Care during the described time interval was provided by me .  I have reviewed this patient's available data, including medical history, events of note, physical examination, and all test results as part of my evaluation. I have personally reviewed and interpreted all radiology studies.  CBC:  Recent Labs Lab 01/09/16 0324 01/10/16 0249 01/11/16 0300 01/12/16 0220  WBC 11.0* 11.1* 11.6* 8.3  HGB 8.9* 8.2* 8.5* 9.8*  HCT 29.6* 26.6* 27.8* 30.4*  MCV 94.3 91.4 91.1 89.9  PLT 237 201 207 676   Basic Metabolic Panel:  Recent Labs Lab 01/09/16 0324 01/10/16 0029  01/10/16 0249 01/11/16 0300 01/12/16 0220  NA 139 142 142 137 139  K 3.5 4.1 4.4 4.3 4.6  CL 103 105 104 98* 96*  CO2 31 32 32 30 36*  GLUCOSE 163* 130* 128* 105* 139*  BUN 21* '18 16 17 16  '$ CREATININE 0.88 0.52 0.54 0.54 0.50  CALCIUM 8.3* 9.2 9.4 9.2 9.3  MG  --  1.9  --   --   --    GFR: Estimated Creatinine Clearance: 51.7 mL/min (by C-G formula based on Cr of 0.5). Liver Function Tests: No results for input(s): AST, ALT, ALKPHOS, BILITOT, PROT, ALBUMIN in the last 168 hours. No results for input(s): LIPASE, AMYLASE in the last 168 hours. No results for input(s): AMMONIA in the last 168 hours. Coagulation Profile: No results for input(s): INR, PROTIME in the last 168 hours. Cardiac Enzymes:  Recent Labs Lab 01/11/16 0855  TROPONINI 0.05*   BNP (last 3 results) No results for input(s): PROBNP in the last 8760 hours. HbA1C: No results for input(s): HGBA1C in the last 72 hours. CBG:  Recent Labs Lab 01/14/16 2000 01/14/16 2351 01/15/16 0429 01/15/16 0744 01/15/16 1159  GLUCAP 147* 192* 116* 87 106*   Lipid Profile: No results  for input(s): CHOL, HDL, LDLCALC, TRIG, CHOLHDL, LDLDIRECT in the last 72 hours. Thyroid Function Tests: No results for input(s): TSH, T4TOTAL, FREET4, T3FREE, THYROIDAB in the last 72 hours. Anemia Panel: No results for input(s): VITAMINB12, FOLATE, FERRITIN, TIBC, IRON, RETICCTPCT in the last 72 hours. Urine analysis:    Component Value Date/Time   COLORURINE YELLOW 01/08/2016 0355   APPEARANCEUR CLOUDY* 01/08/2016 0355   LABSPEC 1.013 01/08/2016 0355   PHURINE 7.5 01/08/2016 0355   GLUCOSEU NEGATIVE 01/08/2016 0355   HGBUR NEGATIVE 01/08/2016 0355   BILIRUBINUR NEGATIVE 01/08/2016 0355   KETONESUR NEGATIVE 01/08/2016 0355   PROTEINUR NEGATIVE 01/08/2016 0355   NITRITE POSITIVE* 01/08/2016 0355   LEUKOCYTESUR NEGATIVE 01/08/2016 0355   Sepsis Labs: '@LABRCNTIP'$ (procalcitonin:4,lacticidven:4)  ) Recent Results (from the past  240 hour(s))  MRSA PCR Screening     Status: None   Collection Time: 01/08/16  5:28 AM  Result Value Ref Range Status   MRSA by PCR NEGATIVE NEGATIVE Final    Comment:        The GeneXpert MRSA Assay (FDA approved for NASAL specimens only), is one component of a comprehensive MRSA colonization surveillance program. It is not intended to diagnose MRSA infection nor to guide or monitor treatment for MRSA infections.   Surgical pcr screen     Status: Abnormal   Collection Time: 01/08/16  9:32 AM  Result Value Ref Range Status   MRSA, PCR NEGATIVE NEGATIVE Final   Staphylococcus aureus POSITIVE (A) NEGATIVE Final    Comment:        The Xpert SA Assay (FDA approved for NASAL specimens in patients over 57 years of age), is one component of a comprehensive surveillance program.  Test performance has been validated by [facility] for patients greater than or equal to 60 year old. It is not intended to diagnose infection nor to guide or monitor treatment.          Radiology Studies: Dg Chest Port 1 View  01/14/2016  CLINICAL DATA:  Shortness of breath for 1 day EXAM: PORTABLE CHEST 1 VIEW COMPARISON:  01/11/2016 FINDINGS: The heart size and mediastinal contours are within normal limits. Both lungs are clear. The visualized skeletal structures are unremarkable. IMPRESSION: No active disease. Electronically Signed   By: Inez Catalina M.D.   On: 01/14/2016 15:46        Scheduled Meds: . antiseptic oral rinse  7 mL Mouth Rinse q12n4p  . aspirin EC  325 mg Oral Q breakfast  . benztropine  0.5 mg Per Tube QHS  . budesonide (PULMICORT) nebulizer solution  0.5 mg Nebulization BID  . busPIRone  10 mg Per Tube BID  . dextromethorphan-guaiFENesin  1 tablet Oral BID  . docusate sodium  100 mg Oral BID  . enoxaparin (LOVENOX) injection  30 mg Subcutaneous Q24H  . feeding supplement (ENSURE ENLIVE)  237 mL Oral TID BM  . FLUoxetine  40 mg Per Tube q morning - 10a  . insulin aspart   0-20 Units Subcutaneous Q4H  . ipratropium-albuterol  3 mL Nebulization QID  . lamoTRIgine  100 mg Per Tube Q breakfast  . methylPREDNISolone (SOLU-MEDROL) injection  60 mg Intravenous Q24H  . pantoprazole sodium  40 mg Per Tube Daily  . risperiDONE  2 mg Per Tube QHS  . traZODone  50 mg Per Tube QHS   Continuous Infusions: . sodium chloride 50 mL/hr (01/15/16 0743)     LOS: 7 days    Time spent: 40 minutes  Allie Bossier, MD [facility] Pager [phone]   If 7PM-7AM, please contact night-coverage [email or web address] Password [facility] 01/15/2016, 4:56 PM

## 2016-01-15 NOTE — Progress Notes (Signed)
Notified on call provider of central telemetry calling in regards to patient HR being 156 at highest and in and out of SVT. Awaiting return page. Will monitor as necessary.

## 2016-01-16 DIAGNOSIS — J9611 Chronic respiratory failure with hypoxia: Secondary | ICD-10-CM

## 2016-01-16 DIAGNOSIS — R55 Syncope and collapse: Secondary | ICD-10-CM

## 2016-01-16 DIAGNOSIS — I10 Essential (primary) hypertension: Secondary | ICD-10-CM

## 2016-01-16 DIAGNOSIS — E46 Unspecified protein-calorie malnutrition: Secondary | ICD-10-CM

## 2016-01-16 DIAGNOSIS — F319 Bipolar disorder, unspecified: Secondary | ICD-10-CM

## 2016-01-16 DIAGNOSIS — S72442A Displaced fracture of lower epiphysis (separation) of left femur, initial encounter for closed fracture: Secondary | ICD-10-CM

## 2016-01-16 DIAGNOSIS — Z720 Tobacco use: Secondary | ICD-10-CM

## 2016-01-16 DIAGNOSIS — R0602 Shortness of breath: Secondary | ICD-10-CM

## 2016-01-16 DIAGNOSIS — F203 Undifferentiated schizophrenia: Secondary | ICD-10-CM

## 2016-01-16 LAB — BLOOD GAS, ARTERIAL
ACID-BASE EXCESS: 10.2 mmol/L — AB (ref 0.0–2.0)
Bicarbonate: 35 mEq/L — ABNORMAL HIGH (ref 20.0–24.0)
DRAWN BY: 305991
FIO2: 0.28
O2 Saturation: 96.4 %
PCO2 ART: 55.1 mmHg — AB (ref 35.0–45.0)
PO2 ART: 87.5 mmHg (ref 80.0–100.0)
Patient temperature: 98.6
TCO2: 36.7 mmol/L (ref 0–100)
pH, Arterial: 7.419 (ref 7.350–7.450)

## 2016-01-16 LAB — GLUCOSE, CAPILLARY
GLUCOSE-CAPILLARY: 97 mg/dL (ref 65–99)
Glucose-Capillary: 119 mg/dL — ABNORMAL HIGH (ref 65–99)
Glucose-Capillary: 143 mg/dL — ABNORMAL HIGH (ref 65–99)
Glucose-Capillary: 94 mg/dL (ref 65–99)

## 2016-01-16 LAB — HEMOGLOBIN AND HEMATOCRIT, BLOOD
HCT: 27.8 % — ABNORMAL LOW (ref 36.0–46.0)
HEMOGLOBIN: 8.7 g/dL — AB (ref 12.0–15.0)

## 2016-01-16 MED ORDER — PREDNISONE 10 MG PO TABS: 10.0000 mg | ORAL_TABLET | Freq: Every day | ORAL | Status: DC

## 2016-01-16 MED ORDER — DM-GUAIFENESIN ER 30-600 MG PO TB12: 1.0000 | ORAL_TABLET | Freq: Two times a day (BID) | ORAL | Status: AC

## 2016-01-16 MED ORDER — ZOLPIDEM TARTRATE 5 MG PO TABS: 5.0000 mg | ORAL_TABLET | Freq: Every evening | ORAL | Status: DC | PRN

## 2016-01-16 MED ORDER — BISACODYL 10 MG RE SUPP: 10.0000 mg | Freq: Every day | RECTAL | Status: DC | PRN

## 2016-01-16 MED ORDER — IPRATROPIUM-ALBUTEROL 0.5-2.5 (3) MG/3ML IN SOLN
3.0000 mL | Freq: Three times a day (TID) | RESPIRATORY_TRACT | Status: DC
  Administered 2016-01-16 (×2): 3 mL via RESPIRATORY_TRACT
  Filled 2016-01-16 (×2): qty 3

## 2016-01-16 MED ORDER — DOCUSATE SODIUM 100 MG PO CAPS: 100.0000 mg | ORAL_CAPSULE | Freq: Two times a day (BID) | ORAL | Status: DC

## 2016-01-16 MED ORDER — ENSURE ENLIVE PO LIQD: 237.0000 mL | Freq: Three times a day (TID) | ORAL | Status: AC

## 2016-01-16 NOTE — Progress Notes (Signed)
PROGRESS NOTE    Caroline Hill  DPO:[id number] DOB: [birth year] DOA: 01/07/2016 PCP: [name], MD   Brief Narrative:  HPI on 01/08/2016 by Anette Guarneri is a 70 y.o. female with a past medical history significant for COPD on home O2, CAD, and HTN who presents with fall. The patient was given fentanyl immediately before our exam and so history is limited by her foggy attention. She seems to state that she was walking down stairs in her home tonight when she felt lightheaded and then blacked out and fell. She denies preceding chest pain, heaviness, pressure, dyspnea, or palpitations.  ED notes suggest that she was just sitting in her living room when she stood up and her leg gave out and she fell hitting her head and being unable to stand, suggesting a pathologic fracture.  Assessment & Plan   Left femur fracture -Orthopedics consulted and appreciated -S/P Retrograde intramedullary nail placement, left femur -Per Dr. Ninfa Linden "non-weight bearing on her left leg except for transfers due to the soft nature of her bone" -Out of bed to chair using overhead lift -Air mattress ensure on rotation at all times -PT/OT's: Recommend SNF.   Acute on chronic hypercapneic and hypoxic resp failure -Resolved  Severe COPD with mild wheeze -Continue steroids, DuoNeb QID, Budesonide nebs BID, Mucinex DM BID -Continue supplemental O2 -Continued to smoke until she was admitted  HTN -Currently controlled -No beta blockers secondary to patient's severe COPD  -If required would start on CCB or ACEI  Syncope and collapse  -Echocardiogram; diastolic dysfunction see results below.  Protein calorie malnutrition -Continue nutritional supplements  Acute Blood loss anemia -No labs drawn this morning -Will obtain H/H for the morning  Baseline Schizophrenia and Bipolar d/o.  -Appears to be stable -Continue Cogentin 0.5 mg daily, Buspirone 10 mg BID, Prozac 40 mg daily,  Lamictal 100 mg daily, Risperdal 2 mg daily, trazodone 50 mg daily  Insomnia -Ambien 5 mg QHS PRN  DVT Prophylaxis  Lovenox  Code Status: Full  Family Communication: None at bedside  Disposition Plan: Admitted. Pending SNF.  Consultants Orthopedics, Dr. Ninfa Linden PCCM  Procedures/Significant Events  L LE XR 6/25 >> L femoral fracture, displaced and shortened 6/25 Head CT >> no acute bleed or CVA 6/25 CT c spine >> no cervical spine injury 6/25 ET intubated for hypercapneic resp failure 6/25 Retrograde intramedullary nail placement, left femur 6/30 echocardiogram; - Left ventricle: -LVEF =55% to 60%. -(grade 1 diastolic dysfunction). - Pulmonary arteries: peak pressure: 31 mm Hg (S).  Antibiotics   Anti-infectives    Start     Dose/Rate Route Frequency Ordered Stop   01/08/16 1800  ceFAZolin (ANCEF) IVPB 2g/100 mL premix     2 g 200 mL/hr over 30 Minutes Intravenous Every 6 hours 01/08/16 1600 01/09/16 0200   01/08/16 0800  ceFAZolin (ANCEF) IVPB 2g/100 mL premix  Status:  Discontinued     2 g 200 mL/hr over 30 Minutes Intravenous To [facility] 01/08/16 0409 01/08/16 1556      Subjective:   Ma Rings seen and examined today.  Still feels breathing is difficult and she is short of breath. Uses 3L of O2 at home.  Denies current chest pain, abdominal pain, nausea, vomiting, headache, dizziness.  Objective:   Filed Vitals:   01/15/16 2034 01/15/16 2044 01/16/16 0526 01/16/16 0913  BP: 138/65  145/75   Pulse: 96  89   Temp:   98.2 F (36.8 C)   TempSrc:  Resp:   16   Height:      Weight:   48.6 kg (107 lb 2.3 oz)   SpO2: 97% 97% 98% 99%    Intake/Output Summary (Last 24 hours) at 01/16/16 1210 Last data filed at 01/15/16 1430  Gross per 24 hour  Intake    240 ml  Output      0 ml  Net    240 ml   Filed Weights   01/13/16 0500 01/14/16 0500 01/16/16 0526  Weight: 49.1 kg (108 lb 3.9 oz) 49.3 kg (108 lb 11 oz) 48.6 kg (107 lb 2.3 oz)     Exam  General: Well developed, thin, cachetic, no distress  HEENT: NCAT, mucous membranes moist.   Neck: Supple, no JVD, no masses  Cardiovascular: S1 S2 auscultated, no murmurs, RRR  Respiratory: Diminished breath sounds, +wheezing  Abdomen: Soft, nontender, nondistended, + bowel sounds  Extremities: warm dry without cyanosis clubbing or edema  Neuro: AAOx3, nonfocal  Psych: Normal affect and demeanor   Data Reviewed: I have personally reviewed following labs and imaging studies  CBC:  Recent Labs Lab 01/10/16 0249 01/11/16 0300 01/12/16 0220  WBC 11.1* 11.6* 8.3  HGB 8.2* 8.5* 9.8*  HCT 26.6* 27.8* 30.4*  MCV 91.4 91.1 89.9  PLT 201 207 253   Basic Metabolic Panel:  Recent Labs Lab 01/10/16 0029 01/10/16 0249 01/11/16 0300 01/12/16 0220  NA 142 142 137 139  K 4.1 4.4 4.3 4.6  CL 105 104 98* 96*  CO2 32 32 30 36*  GLUCOSE 130* 128* 105* 139*  BUN '18 16 17 16  '$ CREATININE 0.52 0.54 0.54 0.50  CALCIUM 9.2 9.4 9.2 9.3  MG 1.9  --   --   --    GFR: Estimated Creatinine Clearance: 50.9 mL/min (by C-G formula based on Cr of 0.5). Liver Function Tests: No results for input(s): AST, ALT, ALKPHOS, BILITOT, PROT, ALBUMIN in the last 168 hours. No results for input(s): LIPASE, AMYLASE in the last 168 hours. No results for input(s): AMMONIA in the last 168 hours. Coagulation Profile: No results for input(s): INR, PROTIME in the last 168 hours. Cardiac Enzymes:  Recent Labs Lab 01/11/16 0855  TROPONINI 0.05*   BNP (last 3 results) No results for input(s): PROBNP in the last 8760 hours. HbA1C: No results for input(s): HGBA1C in the last 72 hours. CBG:  Recent Labs Lab 01/15/16 2017 01/15/16 2031 01/16/16 0017 01/16/16 0358 01/16/16 0816  GLUCAP 151* 186* 143* 119* 94   Lipid Profile: No results for input(s): CHOL, HDL, LDLCALC, TRIG, CHOLHDL, LDLDIRECT in the last 72 hours. Thyroid Function Tests: No results for input(s): TSH, T4TOTAL,  FREET4, T3FREE, THYROIDAB in the last 72 hours. Anemia Panel: No results for input(s): VITAMINB12, FOLATE, FERRITIN, TIBC, IRON, RETICCTPCT in the last 72 hours. Urine analysis:    Component Value Date/Time   COLORURINE YELLOW 01/08/2016 0355   APPEARANCEUR CLOUDY* 01/08/2016 0355   LABSPEC 1.013 01/08/2016 0355   PHURINE 7.5 01/08/2016 0355   GLUCOSEU NEGATIVE 01/08/2016 0355   HGBUR NEGATIVE 01/08/2016 0355   BILIRUBINUR NEGATIVE 01/08/2016 0355   KETONESUR NEGATIVE 01/08/2016 0355   PROTEINUR NEGATIVE 01/08/2016 0355   NITRITE POSITIVE* 01/08/2016 0355   LEUKOCYTESUR NEGATIVE 01/08/2016 0355   Sepsis Labs: '@LABRCNTIP'$ (procalcitonin:4,lacticidven:4)  ) Recent Results (from the past 240 hour(s))  MRSA PCR Screening     Status: None   Collection Time: 01/08/16  5:28 AM  Result Value Ref Range Status   MRSA by PCR NEGATIVE  NEGATIVE Final    Comment:        The GeneXpert MRSA Assay (FDA approved for NASAL specimens only), is one component of a comprehensive MRSA colonization surveillance program. It is not intended to diagnose MRSA infection nor to guide or monitor treatment for MRSA infections.   Surgical pcr screen     Status: Abnormal   Collection Time: 01/08/16  9:32 AM  Result Value Ref Range Status   MRSA, PCR NEGATIVE NEGATIVE Final   Staphylococcus aureus POSITIVE (A) NEGATIVE Final    Comment:        The Xpert SA Assay (FDA approved for NASAL specimens in patients over 36 years of age), is one component of a comprehensive surveillance program.  Test performance has been validated by [facility] for patients greater than or equal to 25 year old. It is not intended to diagnose infection nor to guide or monitor treatment.       Radiology Studies: Dg Chest Port 1 View  01/14/2016  CLINICAL DATA:  Shortness of breath for 1 day EXAM: PORTABLE CHEST 1 VIEW COMPARISON:  01/11/2016 FINDINGS: The heart size and mediastinal contours are within normal limits.  Both lungs are clear. The visualized skeletal structures are unremarkable. IMPRESSION: No active disease. Electronically Signed   By: Inez Catalina M.D.   On: 01/14/2016 15:46     Scheduled Meds: . antiseptic oral rinse  7 mL Mouth Rinse q12n4p  . aspirin EC  325 mg Oral Q breakfast  . benztropine  0.5 mg Per Tube QHS  . budesonide (PULMICORT) nebulizer solution  0.5 mg Nebulization BID  . busPIRone  10 mg Per Tube BID  . dextromethorphan-guaiFENesin  1 tablet Oral BID  . docusate sodium  100 mg Oral BID  . enoxaparin (LOVENOX) injection  30 mg Subcutaneous Q24H  . feeding supplement (ENSURE ENLIVE)  237 mL Oral TID BM  . FLUoxetine  40 mg Per Tube q morning - 10a  . insulin aspart  0-20 Units Subcutaneous Q4H  . ipratropium-albuterol  3 mL Nebulization TID  . lamoTRIgine  100 mg Per Tube Q breakfast  . methylPREDNISolone (SOLU-MEDROL) injection  60 mg Intravenous Q24H  . pantoprazole sodium  40 mg Per Tube Daily  . risperiDONE  2 mg Per Tube QHS  . traZODone  50 mg Per Tube QHS   Continuous Infusions: . sodium chloride 50 mL/hr (01/15/16 0743)     LOS: 8 days   Time Spent in minutes   30 minutes  Reshonda Koerber D.O. on 01/16/2016 at 12:10 PM  Between 7am to 7pm - Pager - [phone]  After 7pm go to [email or web address] - password [email or web address]  And look for the night coverage person covering for me after hours  [facility] Office  [phone]

## 2016-01-16 NOTE — Progress Notes (Signed)
Discharge Note:  Patient alert and oriented X 4 at this time and in no distress.  Patient's husband has arrived to transport her to [facility]. Report called to the nurse, Judeen Hammans at that facility.  Patient's husband to deliver prescriptions to the facility.  Telemetry and peripheral IV discontinued.  Patient's husband brought her home O2 tank to transport her on 3LO2 via nasal cannula.  Patient transported to the car by NT.

## 2016-01-16 NOTE — Progress Notes (Signed)
Physical Therapy Treatment Patient Details Name: Caroline Hill MRN: [id number] DOB: July 08, 1946 Today's Date: 01/16/2016    History of Present Illness Pt is 70 yo female s/p INTRAMEDULLARY (IM) RETROGRADE FEMORAL NAILING (Left). PMHx:dyspnea, COPD, HTN, anxiety, CAD, bipolar.    PT Comments    Progressing steadily with less confusion than related in last therapy note.  Pt able to understand and comply with NWB L LE during gait, but could not go far due to fatigue.  Follow Up Recommendations  SNF;Supervision/Assistance - 24 hour     Equipment Recommendations  None recommended by PT    Recommendations for Other Services       Precautions / Restrictions Precautions Precautions: Fall Restrictions LLE Weight Bearing: Partial weight bearing LLE Partial Weight Bearing Percentage or Pounds: Not more than 25% Other Position/Activity Restrictions: MD states that if pt unable to discern 25% then keep her at NWB    Mobility  Bed Mobility Overal bed mobility: Needs Assistance Bed Mobility: Supine to Sit;Sit to Supine     Supine to sit: Supervision Sit to supine: Supervision   General bed mobility comments: some difficulty getting the L leg up on the bed, but she accomplished the task.  Transfers Overall transfer level: Needs assistance Equipment used: Rolling walker (2 wheeled) Transfers: Sit to/from Stand Sit to Stand: Min assist         General transfer comment: min steady assist  Ambulation/Gait Ambulation/Gait assistance: Min assist;+2 physical assistance;+2 safety/equipment Ambulation Distance (Feet): 5 Feet (x2 with NWB on the L LE after cued and demo'd.) Assistive device: Rolling walker (2 wheeled) Gait Pattern/deviations: Step-to pattern     General Gait Details: able to easily maintain NWB once cued and then only for short distances due to fatigue   Stairs            Wheelchair Mobility    Modified Rankin (Stroke Patients Only)       Balance    Sitting-balance support: No upper extremity supported Sitting balance-Leahy Scale: Good       Standing balance-Leahy Scale: Poor Standing balance comment: reliant on both UE's                    Cognition Arousal/Alertness: Awake/alert Behavior During Therapy: Anxious Overall Cognitive Status: Within Functional Limits for tasks assessed                      Exercises General Exercises - Lower Extremity Ankle Circles/Pumps: AROM;Both;10 reps;Supine Heel Slides: AAROM;AROM;Both;10 reps (aarom on L LE, resisted on R LE)    General Comments        Pertinent Vitals/Pain Pain Assessment: Faces Pain Score: 4  Pain Location: L LE Pain Descriptors / Indicators: Grimacing;Sore Pain Intervention(s): Monitored during session;Repositioned;Limited activity within patient's tolerance    Home Living                      Prior Function            PT Goals (current goals can now be found in the care plan section) Acute Rehab PT Goals PT Goal Formulation: With family Time For Goal Achievement: 01/25/16 Potential to Achieve Goals: Good Progress towards PT goals: Progressing toward goals    Frequency  Min 3X/week    PT Plan Current plan remains appropriate    Co-evaluation             End of Session Equipment Utilized During Treatment: Gait belt;Oxygen Activity Tolerance:  Patient tolerated treatment well;Patient limited by fatigue Patient left: with bed alarm set;with call bell/phone within reach;in bed     Time: 2957-4734 PT Time Calculation (min) (ACUTE ONLY): 22 min  Charges:  $Therapeutic Activity: 8-22 mins                    G Codes:      Zarif Rathje, Tessie Fass 01/16/2016, 4:13 PM 01/16/2016  Donnella Sham, PT [phone]  (pager)

## 2016-01-16 NOTE — Progress Notes (Signed)
PASRR #: [id number] Sherri Rad LCSWA [phone]

## 2016-01-16 NOTE — Progress Notes (Signed)
Patient will DC to: [facility] [phone]) Anticipated DC date: 01/16/16 Family notified: Spouse and Son Transport by: Spouse will come get by car   Per MD patient ready for DC to [facility]. RN, patient, patient's family, and facility notified of DC. RN given number for report.   CSW signing off.  Cedric Fishman, [place] Social Worker [phone]
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
## 2016-01-16 NOTE — Care Management Note (Signed)
Case Management Note  Patient Details  Name: Caroline Hill MRN: [id number] Date of Birth: 28-Dec-1945  Subjective/Objective:                    Action/Plan: Discharge to SNF/REHAB  Expected Discharge Date:    01/15/2016           Expected Discharge Plan:  [place] [facility] ,[phone] In-House Referral:  Clinical Social Work  Discharge planning Services  CM Consult  Post Acute Care Choice:    Choice offered to:     DME Arranged:    DME Agency:     HH Arranged:    [place] Agency:     Status of Service:  Completed, signed off  If discussed at [employer], dates discussed:    Additional Comments:  Sharin Mons, RN,BSN,CM 01/16/2016, 4:37 PM

## 2016-01-16 NOTE — Care Management Important Message (Signed)
Important Message  Patient Details  Name: Caroline Hill MRN: [id number] Date of Birth: 22-May-1946   Medicare Important Message Given:  Yes    Nathen May 01/16/2016, 11:52 AM

## 2016-02-07 ENCOUNTER — Other Ambulatory Visit: Payer: Self-pay | Admitting: Pulmonary Disease

## 2016-03-21 ENCOUNTER — Inpatient Hospital Stay (HOSPITAL_COMMUNITY)
Admission: EM | Admit: 2016-03-21 | Discharge: 2016-03-24 | DRG: 640 | Disposition: A | Discharge status: Hospice | Payer: Medicare HMO | Attending: Student in an Organized Health Care Education/Training Program | Admitting: Student in an Organized Health Care Education/Training Program

## 2016-03-21 ENCOUNTER — Encounter (HOSPITAL_COMMUNITY): Payer: Self-pay

## 2016-03-21 ENCOUNTER — Emergency Department (HOSPITAL_COMMUNITY): Payer: Medicare HMO

## 2016-03-21 ENCOUNTER — Observation Stay (HOSPITAL_COMMUNITY): Payer: Medicare HMO

## 2016-03-21 DIAGNOSIS — Z8249 Family history of ischemic heart disease and other diseases of the circulatory system: Secondary | ICD-10-CM

## 2016-03-21 DIAGNOSIS — J449 Chronic obstructive pulmonary disease, unspecified: Secondary | ICD-10-CM

## 2016-03-21 DIAGNOSIS — F329 Major depressive disorder, single episode, unspecified: Secondary | ICD-10-CM | POA: Diagnosis present

## 2016-03-21 DIAGNOSIS — Z7951 Long term (current) use of inhaled steroids: Secondary | ICD-10-CM

## 2016-03-21 DIAGNOSIS — Z7982 Long term (current) use of aspirin: Secondary | ICD-10-CM

## 2016-03-21 DIAGNOSIS — J9690 Respiratory failure, unspecified, unspecified whether with hypoxia or hypercapnia: Secondary | ICD-10-CM | POA: Diagnosis present

## 2016-03-21 DIAGNOSIS — R491 Aphonia: Secondary | ICD-10-CM | POA: Diagnosis present

## 2016-03-21 DIAGNOSIS — Z79899 Other long term (current) drug therapy: Secondary | ICD-10-CM

## 2016-03-21 DIAGNOSIS — E785 Hyperlipidemia, unspecified: Secondary | ICD-10-CM | POA: Diagnosis present

## 2016-03-21 DIAGNOSIS — R402252 Coma scale, best verbal response, oriented, at arrival to emergency department: Secondary | ICD-10-CM | POA: Diagnosis present

## 2016-03-21 DIAGNOSIS — Z885 Allergy status to narcotic agent status: Secondary | ICD-10-CM

## 2016-03-21 DIAGNOSIS — Z7189 Other specified counseling: Secondary | ICD-10-CM

## 2016-03-21 DIAGNOSIS — J9601 Acute respiratory failure with hypoxia: Secondary | ICD-10-CM | POA: Diagnosis present

## 2016-03-21 DIAGNOSIS — F17210 Nicotine dependence, cigarettes, uncomplicated: Secondary | ICD-10-CM | POA: Diagnosis present

## 2016-03-21 DIAGNOSIS — Z9981 Dependence on supplemental oxygen: Secondary | ICD-10-CM

## 2016-03-21 DIAGNOSIS — R402132 Coma scale, eyes open, to sound, at arrival to emergency department: Secondary | ICD-10-CM | POA: Diagnosis present

## 2016-03-21 DIAGNOSIS — G9340 Encephalopathy, unspecified: Secondary | ICD-10-CM | POA: Diagnosis present

## 2016-03-21 DIAGNOSIS — R911 Solitary pulmonary nodule: Secondary | ICD-10-CM

## 2016-03-21 DIAGNOSIS — E874 Mixed disorder of acid-base balance: Secondary | ICD-10-CM | POA: Diagnosis present

## 2016-03-21 DIAGNOSIS — J441 Chronic obstructive pulmonary disease with (acute) exacerbation: Secondary | ICD-10-CM | POA: Diagnosis present

## 2016-03-21 DIAGNOSIS — I2510 Atherosclerotic heart disease of native coronary artery without angina pectoris: Secondary | ICD-10-CM | POA: Diagnosis present

## 2016-03-21 DIAGNOSIS — R402362 Coma scale, best motor response, obeys commands, at arrival to emergency department: Secondary | ICD-10-CM | POA: Diagnosis present

## 2016-03-21 DIAGNOSIS — N390 Urinary tract infection, site not specified: Secondary | ICD-10-CM | POA: Diagnosis present

## 2016-03-21 DIAGNOSIS — Z955 Presence of coronary angioplasty implant and graft: Secondary | ICD-10-CM

## 2016-03-21 DIAGNOSIS — J9621 Acute and chronic respiratory failure with hypoxia: Secondary | ICD-10-CM | POA: Diagnosis present

## 2016-03-21 DIAGNOSIS — Z681 Body mass index (BMI) 19 or less, adult: Secondary | ICD-10-CM

## 2016-03-21 DIAGNOSIS — E43 Unspecified severe protein-calorie malnutrition: Secondary | ICD-10-CM | POA: Diagnosis present

## 2016-03-21 DIAGNOSIS — F32A Depression, unspecified: Secondary | ICD-10-CM | POA: Diagnosis present

## 2016-03-21 DIAGNOSIS — F209 Schizophrenia, unspecified: Secondary | ICD-10-CM | POA: Diagnosis present

## 2016-03-21 DIAGNOSIS — J9602 Acute respiratory failure with hypercapnia: Secondary | ICD-10-CM

## 2016-03-21 DIAGNOSIS — Z515 Encounter for palliative care: Secondary | ICD-10-CM

## 2016-03-21 DIAGNOSIS — F319 Bipolar disorder, unspecified: Secondary | ICD-10-CM | POA: Diagnosis present

## 2016-03-21 DIAGNOSIS — Z7952 Long term (current) use of systemic steroids: Secondary | ICD-10-CM

## 2016-03-21 DIAGNOSIS — F419 Anxiety disorder, unspecified: Secondary | ICD-10-CM | POA: Diagnosis present

## 2016-03-21 DIAGNOSIS — R64 Cachexia: Secondary | ICD-10-CM | POA: Diagnosis present

## 2016-03-21 DIAGNOSIS — I10 Essential (primary) hypertension: Secondary | ICD-10-CM | POA: Diagnosis present

## 2016-03-21 DIAGNOSIS — J9622 Acute and chronic respiratory failure with hypercapnia: Secondary | ICD-10-CM | POA: Diagnosis present

## 2016-03-21 DIAGNOSIS — R918 Other nonspecific abnormal finding of lung field: Secondary | ICD-10-CM | POA: Diagnosis present

## 2016-03-21 LAB — BASIC METABOLIC PANEL
Anion gap: 6 (ref 5–15)
BUN: 23 mg/dL — AB (ref 6–20)
CALCIUM: 12.3 mg/dL — AB (ref 8.9–10.3)
CHLORIDE: 100 mmol/L — AB (ref 101–111)
CO2: 37 mmol/L — ABNORMAL HIGH (ref 22–32)
CREATININE: 0.89 mg/dL (ref 0.44–1.00)
GFR calc Af Amer: >60 mL/min (ref >60)
Glucose, Bld: 146 mg/dL — ABNORMAL HIGH (ref 65–99)
Potassium: 3.1 mmol/L — ABNORMAL LOW (ref 3.5–5.1)
SODIUM: 143 mmol/L (ref 135–145)

## 2016-03-21 LAB — CBC
HEMATOCRIT: 39.3 % (ref 36.0–46.0)
HEMOGLOBIN: 11.7 g/dL — AB (ref 12.0–15.0)
MCH: 26.7 pg (ref 26.0–34.0)
MCHC: 29.8 g/dL — ABNORMAL LOW (ref 30.0–36.0)
MCV: 89.5 fL (ref 78.0–100.0)
Platelets: 413 10*3/uL — ABNORMAL HIGH (ref 150–400)
RBC: 4.39 MIL/uL (ref 3.87–5.11)
RDW: 14.4 % (ref 11.5–15.5)
WBC: 18.5 10*3/uL — AB (ref 4.0–10.5)

## 2016-03-21 LAB — COMPREHENSIVE METABOLIC PANEL
ALT: 20 U/L (ref 14–54)
ANION GAP: 6 (ref 5–15)
AST: 21 U/L (ref 15–41)
Albumin: 2.5 g/dL — ABNORMAL LOW (ref 3.5–5.0)
Alkaline Phosphatase: 96 U/L (ref 38–126)
BILIRUBIN TOTAL: 0.6 mg/dL (ref 0.3–1.2)
BUN: 27 mg/dL — ABNORMAL HIGH (ref 6–20)
CO2: 40 mmol/L — ABNORMAL HIGH (ref 22–32)
Calcium: 12.9 mg/dL — ABNORMAL HIGH (ref 8.9–10.3)
Chloride: 97 mmol/L — ABNORMAL LOW (ref 101–111)
Creatinine, Ser: 0.97 mg/dL (ref 0.44–1.00)
GFR calc Af Amer: >60 mL/min (ref >60)
GFR, EST NON AFRICAN AMERICAN: 58 mL/min — AB (ref >60)
Glucose, Bld: 110 mg/dL — ABNORMAL HIGH (ref 65–99)
POTASSIUM: 3.4 mmol/L — AB (ref 3.5–5.1)
Sodium: 143 mmol/L (ref 135–145)
TOTAL PROTEIN: 6.3 g/dL — AB (ref 6.5–8.1)

## 2016-03-21 LAB — CBC WITH DIFFERENTIAL/PLATELET
BASOS PCT: 0 %
Basophils Absolute: 0 10*3/uL (ref 0.0–0.1)
EOS ABS: 0 10*3/uL (ref 0.0–0.7)
EOS PCT: 0 %
HCT: 39.2 % (ref 36.0–46.0)
Hemoglobin: 11.6 g/dL — ABNORMAL LOW (ref 12.0–15.0)
Lymphocytes Relative: 4 %
Lymphs Abs: 0.9 10*3/uL (ref 0.7–4.0)
MCH: 26.5 pg (ref 26.0–34.0)
MCHC: 29.6 g/dL — AB (ref 30.0–36.0)
MCV: 89.7 fL (ref 78.0–100.0)
MONO ABS: 0.9 10*3/uL (ref 0.1–1.0)
MONOS PCT: 4 %
Neutro Abs: 19.1 10*3/uL — ABNORMAL HIGH (ref 1.7–7.7)
Neutrophils Relative %: 92 %
PLATELETS: 455 10*3/uL — AB (ref 150–400)
RBC: 4.37 MIL/uL (ref 3.87–5.11)
RDW: 14.3 % (ref 11.5–15.5)
WBC: 21 10*3/uL — ABNORMAL HIGH (ref 4.0–10.5)

## 2016-03-21 LAB — I-STAT ARTERIAL BLOOD GAS, ED
ACID-BASE EXCESS: 15 mmol/L — AB (ref 0.0–2.0)
BICARBONATE: 42 mmol/L — AB (ref 20.0–28.0)
O2 SAT: 78 %
TCO2: 44 mmol/L (ref 0–100)
pCO2 arterial: 60.6 mmHg — ABNORMAL HIGH (ref 32.0–48.0)
pH, Arterial: 7.448 (ref 7.350–7.450)
pO2, Arterial: 42 mmHg — ABNORMAL LOW (ref 83.0–108.0)

## 2016-03-21 LAB — URINALYSIS, ROUTINE W REFLEX MICROSCOPIC
BILIRUBIN URINE: NEGATIVE
Glucose, UA: NEGATIVE mg/dL
Hgb urine dipstick: NEGATIVE
Ketones, ur: NEGATIVE mg/dL
Leukocytes, UA: NEGATIVE
NITRITE: NEGATIVE
PROTEIN: NEGATIVE mg/dL
SPECIFIC GRAVITY, URINE: 1.02 (ref 1.005–1.030)
pH: 6 (ref 5.0–8.0)

## 2016-03-21 LAB — MAGNESIUM: MAGNESIUM: 1.9 mg/dL (ref 1.7–2.4)

## 2016-03-21 LAB — CBG MONITORING, ED: Glucose-Capillary: 99 mg/dL (ref 65–99)

## 2016-03-21 LAB — I-STAT CG4 LACTIC ACID, ED: LACTIC ACID, VENOUS: 1.12 mmol/L (ref 0.5–1.9)

## 2016-03-21 LAB — MRSA PCR SCREENING: MRSA by PCR: NEGATIVE

## 2016-03-21 LAB — TSH: TSH: 1.106 u[IU]/mL (ref 0.350–4.500)

## 2016-03-21 LAB — LIPASE, BLOOD: LIPASE: 19 U/L (ref 11–51)

## 2016-03-21 LAB — CK: Total CK: 30 U/L — ABNORMAL LOW (ref 38–234)

## 2016-03-21 MED ORDER — FLUOXETINE HCL 20 MG PO CAPS
40.0000 mg | ORAL_CAPSULE | Freq: Every morning | ORAL | Status: DC
  Filled 2016-03-21: qty 2

## 2016-03-21 MED ORDER — SODIUM CHLORIDE 0.9 % IV BOLUS (SEPSIS): 250.0000 mL | Freq: Once | INTRAVENOUS | Status: DC

## 2016-03-21 MED ORDER — PREDNISONE 20 MG PO TABS
40.0000 mg | ORAL_TABLET | Freq: Every day | ORAL | Status: DC
  Administered 2016-03-22 – 2016-03-23 (×2): 40 mg via ORAL
  Filled 2016-03-21 (×2): qty 2

## 2016-03-21 MED ORDER — BENZTROPINE MESYLATE 1 MG PO TABS
0.5000 mg | ORAL_TABLET | Freq: Every day | ORAL | Status: DC
  Administered 2016-03-21: 0.5 mg via ORAL
  Filled 2016-03-21: qty 1

## 2016-03-21 MED ORDER — RISPERIDONE 1 MG PO TABS
2.0000 mg | ORAL_TABLET | Freq: Every day | ORAL | Status: DC
  Administered 2016-03-21: 2 mg via ORAL
  Filled 2016-03-21: qty 4
  Filled 2016-03-21: qty 2

## 2016-03-21 MED ORDER — METHOCARBAMOL 500 MG PO TABS: 500.0000 mg | ORAL_TABLET | Freq: Four times a day (QID) | ORAL | Status: DC | PRN

## 2016-03-21 MED ORDER — CALCITONIN (SALMON) 200 UNIT/ML IJ SOLN
4.0000 [IU]/kg | Freq: Four times a day (QID) | INTRAMUSCULAR | Status: DC
  Administered 2016-03-21 – 2016-03-22 (×2): 178 [IU] via INTRAMUSCULAR
  Filled 2016-03-21 (×4): qty 0.89

## 2016-03-21 MED ORDER — ORAL CARE MOUTH RINSE
15.0000 mL | Freq: Two times a day (BID) | OROMUCOSAL | Status: DC
  Administered 2016-03-22 – 2016-03-24 (×6): 15 mL via OROMUCOSAL

## 2016-03-21 MED ORDER — NITROFURANTOIN MONOHYD MACRO 100 MG PO CAPS
100.0000 mg | ORAL_CAPSULE | Freq: Two times a day (BID) | ORAL | Status: DC
  Administered 2016-03-21 – 2016-03-22 (×3): 100 mg via ORAL
  Filled 2016-03-21 (×5): qty 1

## 2016-03-21 MED ORDER — POTASSIUM CHLORIDE 20 MEQ/15ML (10%) PO SOLN
40.0000 meq | Freq: Once | ORAL | Status: AC
  Administered 2016-03-21: 40 meq via ORAL
  Filled 2016-03-21: qty 30

## 2016-03-21 MED ORDER — SODIUM CHLORIDE 0.9 % IV SOLN
INTRAVENOUS | Status: AC
  Administered 2016-03-21 – 2016-03-22 (×5): via INTRAVENOUS

## 2016-03-21 MED ORDER — BUDESONIDE 0.5 MG/2ML IN SUSP
0.5000 mg | Freq: Two times a day (BID) | RESPIRATORY_TRACT | Status: DC
  Administered 2016-03-21 – 2016-03-24 (×6): 0.5 mg via RESPIRATORY_TRACT
  Filled 2016-03-21 (×6): qty 2

## 2016-03-21 MED ORDER — DM-GUAIFENESIN ER 30-600 MG PO TB12
1.0000 | ORAL_TABLET | Freq: Two times a day (BID) | ORAL | Status: DC
  Administered 2016-03-21 – 2016-03-24 (×6): 1 via ORAL
  Filled 2016-03-21 (×6): qty 1

## 2016-03-21 MED ORDER — IPRATROPIUM-ALBUTEROL 0.5-2.5 (3) MG/3ML IN SOLN
3.0000 mL | Freq: Four times a day (QID) | RESPIRATORY_TRACT | Status: DC
  Administered 2016-03-22 – 2016-03-24 (×9): 3 mL via RESPIRATORY_TRACT
  Filled 2016-03-21 (×9): qty 3

## 2016-03-21 MED ORDER — ALBUTEROL SULFATE (2.5 MG/3ML) 0.083% IN NEBU: 3.0000 mL | INHALATION_SOLUTION | RESPIRATORY_TRACT | Status: DC | PRN

## 2016-03-21 MED ORDER — LAMOTRIGINE 100 MG PO TABS
100.0000 mg | ORAL_TABLET | Freq: Every day | ORAL | Status: DC
  Filled 2016-03-21: qty 1

## 2016-03-21 MED ORDER — IPRATROPIUM-ALBUTEROL 0.5-2.5 (3) MG/3ML IN SOLN
3.0000 mL | RESPIRATORY_TRACT | Status: DC
  Administered 2016-03-21 (×2): 3 mL via RESPIRATORY_TRACT
  Filled 2016-03-21 (×2): qty 3

## 2016-03-21 MED ORDER — ADULT MULTIVITAMIN W/MINERALS CH
1.0000 | ORAL_TABLET | ORAL | Status: DC
  Administered 2016-03-22 – 2016-03-24 (×3): 1 via ORAL
  Filled 2016-03-21 (×3): qty 1

## 2016-03-21 MED ORDER — ENSURE ENLIVE PO LIQD
237.0000 mL | Freq: Three times a day (TID) | ORAL | Status: DC
  Administered 2016-03-21 – 2016-03-24 (×7): 237 mL via ORAL

## 2016-03-21 MED ORDER — ALBUTEROL SULFATE HFA 108 (90 BASE) MCG/ACT IN AERS: 1.0000 | INHALATION_SPRAY | RESPIRATORY_TRACT | Status: DC

## 2016-03-21 MED ORDER — DOCUSATE SODIUM 100 MG PO CAPS
100.0000 mg | ORAL_CAPSULE | Freq: Two times a day (BID) | ORAL | Status: DC
  Administered 2016-03-21 – 2016-03-24 (×6): 100 mg via ORAL
  Filled 2016-03-21 (×6): qty 1

## 2016-03-21 MED ORDER — ASPIRIN EC 325 MG PO TBEC
325.0000 mg | DELAYED_RELEASE_TABLET | Freq: Every day | ORAL | Status: DC
  Administered 2016-03-22 – 2016-03-23 (×2): 325 mg via ORAL
  Filled 2016-03-21 (×2): qty 1

## 2016-03-21 MED ORDER — SODIUM CHLORIDE 0.9 % IV SOLN: INTRAVENOUS | Status: DC

## 2016-03-21 MED ORDER — BISACODYL 10 MG RE SUPP: 10.0000 mg | Freq: Every day | RECTAL | Status: DC | PRN

## 2016-03-21 MED ORDER — ENOXAPARIN SODIUM 40 MG/0.4ML ~~LOC~~ SOLN
40.0000 mg | SUBCUTANEOUS | Status: DC
  Administered 2016-03-21 – 2016-03-23 (×3): 40 mg via SUBCUTANEOUS
  Filled 2016-03-21 (×3): qty 0.4
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
## 2016-03-21 NOTE — ED Notes (Signed)
Report called and accepted

## 2016-03-21 NOTE — ED Notes (Signed)
CBG- 99 

## 2016-03-21 NOTE — ED Triage Notes (Signed)
Pt being treated for a kidney infection and has had altered mental status since Friday. Pt. Doesn't talk from a previous intubation, unable to walk right now. Husband found her on the floor this AM

## 2016-03-21 NOTE — Progress Notes (Signed)
RT Note: Patient was desaturating to 86% on her nasal cannula at 4lpm. Her nasal cannula has been turned up to 6lpm and she is now tolerating that better and her Spo2 is remaining at 91% on those settings. Patient is in no apparent respiratory distress and is maintaining well. Rt will continue to monitor.

## 2016-03-21 NOTE — ED Notes (Signed)
Pt. Pulled out her IV. New IV inserted and new line and fluids hung for the new IV

## 2016-03-21 NOTE — ED Notes (Signed)
Got patient into a gown and on the monitor

## 2016-03-21 NOTE — Progress Notes (Signed)
RT transported pt to CT and then to room on 2C. Then taken off Bipap and placed on 30% venti mask. Pt tolerating well. RT on 2C notified.

## 2016-03-21 NOTE — H&P (Signed)
Date: 03/21/2016               Patient Name:  Caroline Hill MRN: [id number]  DOB: Jul 18, 1945 Age / Sex: 70 y.o., female   PCP: [name], MD         Medical Service: Internal Medicine Teaching Service         Attending Physician: Dr. Axel Filler, MD    First Contact: Dr. Inda Castle Pager: [phone]  Second Contact: Dr. Quay Burow Pager: [phone]       After Hours (After 5p/  First Contact Pager: [phone]  weekends / holidays): Second Contact Pager: [phone]   Chief Complaint: Altered mental status  History of Present Illness: Ms Breeding is a 70 year old woman with history of lung nodules, pathologic fracture of L femur, COPD, schizophrenia, and bipolar disorder who presented with dyspnea and altered mental status. 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  Patient unable to provide history due to inability to vocalize (remote iatrogenic laryngeal trauma).  Collateral from husband provided on phone.  She has had several days of reduced PO intake, increasing confusion, and nausea and vomiting.  Increasing home O2 requirement from 3L to 4L over the past few days.  Per ED nurse, her husband found her on the floor this morning.  Per Care Everywhere, was seen at [facility] yesterday and treated for a UTI and has history of recurrent UTIs.  Had 1 week of burning dysuria, foul smelling urine, and altered mental status.  No fever, flank pain, hematuria.  One episode of nausea and vomiting 4 days ago.  Urine dip with blood, nitrites, and leuk esterase.  Leukocytosis 23.6.  Given 1g ceftriaxone and started on nitrofurantoin 100 twice daily.  Also had Ca 13.3, Bicarb 33 then.  Meds:  Current Meds  Medication Sig  . amLODipine-benazepril (LOTREL) 5-10 MG capsule Take 1 capsule by mouth daily.  Marland Kitchen aspirin EC 325 MG EC tablet Take 1 tablet (325 mg total) by mouth daily with breakfast.  . benztropine (COGENTIN) 0.5 MG tablet Take 0.5 mg by mouth at bedtime.  . budesonide  (PULMICORT) 0.5 MG/2ML nebulizer solution TAKE 2 MLS (0.5 MG TOTAL) BY NEBULIZATION 2 (TWO) TIMES DAILY.  . busPIRone (BUSPAR) 10 MG tablet Take 10 mg by mouth 2 (two) times daily.  . Cholecalciferol 5000 UNITS TABS Take 5,000 Units by mouth daily.  Marland Kitchen dextromethorphan-guaiFENesin (MUCINEX DM) 30-600 MG 12hr tablet Take 1 tablet by mouth 2 (two) times daily. (Patient taking differently: Take 1 tablet by mouth 2 (two) times daily as needed for cough. )  . feeding supplement, ENSURE ENLIVE, (ENSURE ENLIVE) LIQD Take 237 mLs by mouth 3 (three) times daily between meals.  Marland Kitchen FLUoxetine (PROZAC) 40 MG capsule Take 40 mg by mouth every morning.  . lamoTRIgine (LAMICTAL) 100 MG tablet Take 100 mg by mouth daily with breakfast.   . methocarbamol (ROBAXIN) 500 MG tablet Take 1 tablet (500 mg total) by mouth every 6 (six) hours as needed for muscle spasms (prn for pain as well).  . Multiple Vitamin (MULTIVITAMIN WITH MINERALS) TABS tablet Take 1 tablet by mouth every morning.   . nitrofurantoin, macrocrystal-monohydrate, (MACROBID) 100 MG capsule Take 100 mg by mouth 2 (two) times daily.  . OXYGEN Inhale 3 L into the lungs continuous. Continuous every night and 4 times a day as needed during the day  . PROAIR HFA 108 (90 Base) MCG/ACT inhaler INHALE 2 PUFFS INTO THE LUNGS EVERY 4 (FOUR)  HOURS AS NEEDED FOR WHEEZING OR SHORTNESS OF BREATH.  . risperiDONE (RISPERDAL) 2 MG tablet Take 2 mg by mouth at bedtime.  . traZODone (DESYREL) 100 MG tablet Take 50 mg by mouth at bedtime.      Allergies: Allergies as of 03/21/2016 - Review Complete 03/21/2016  Allergen Reaction Noted  . Oxycodone-acetaminophen Nausea And Vomiting 12/18/2013  . Oxycontin [oxycodone hcl] Nausea Only 10/06/2013   Past Medical History:  Diagnosis Date  . Arthritis    "hands, feet" (12/01/2013)  . Bipolar disorder, unspecified ([place])   . CAD (coronary artery disease)   . COPD (chronic obstructive pulmonary disease) ([place])   . Depression    . Hyperlipidemia   . Hypertension   . Neuromuscular disorder ([place])   . On home oxygen therapy    "3L at night and during day prn" (12/01/2013)  . Other malaise and fatigue   . Schizophrenia ([place])   . Shortness of breath    with exertion   . Tobacco use disorder   . Unspecified constipation   . Unspecified vitamin D deficiency     Family History: Unable to obtain due to altered mental status and inability to speak.  Social History: Unable to obtain due to altered mental status and inability to speak.   Review of Systems: Unable to obtain due to altered mental status and inability to speak.   Physical Exam: Blood pressure 123/94, pulse 104, temperature 99.7 F (37.6 C), resp. rate 20, height '5\' 6"'$  (1.676 m), weight 100 lb (45.4 kg), SpO2 96 %.  Physical Exam  Constitutional:  Cachectic, chronically ill appearing, wearing BiPAP, aphonic voice  HENT:  Head: Normocephalic and atraumatic.  Eyes: Conjunctivae and EOM are normal. Pupils are equal, round, and reactive to light. No scleral icterus.  Neck: Normal range of motion. Neck supple.  Cardiovascular: Normal rate, regular rhythm, normal heart sounds and intact distal pulses.   Pulmonary/Chest: No stridor.  On BiPAP Reduced breath sounds in all lung fields No wheezes or crackles  Abdominal: Soft. She exhibits no distension. There is no tenderness.  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  Neurological:  Alert, cooperative Mental status difficult to assess due to aphonia When given a pen to write with, only able to scribble EOMI, PERRL Tone normal Patellar reflexes 2+ symmetric  Skin: Skin is warm and dry. No rash noted.  Psychiatric:  Mental status exam limited by aphonia     CBC Latest Ref Rng & Units 03/21/2016 01/16/2016 01/12/2016  WBC 4.0 - 10.5 K/uL 18.5(H) - 8.3    Hemoglobin 12.0 - 15.0 g/dL 11.7(L) 8.7(L) 9.8(L)  Hematocrit 36.0 - 46.0 % 39.3 27.8(L) 30.4(L)  Platelets 150 - 400 K/uL 413(H) - 236   CMP Latest Ref Rng & Units 03/21/2016 01/12/2016 01/11/2016  Glucose 65 - 99 mg/dL 110(H) 139(H) 105(H)  BUN 6 - 20 mg/dL 27(H) 16 17  Creatinine 0.44 - 1.00 mg/dL 0.97 0.50 0.54  Sodium 135 - 145 mmol/L 143 139 137  Potassium 3.5 - 5.1 mmol/L 3.4(L) 4.6 4.3  Chloride 101 - 111 mmol/L 97(L) 96(L) 98(L)  CO2 22 - 32 mmol/L 40(H) 36(H) 30  Calcium 8.9 - 10.3 mg/dL 12.9(H) 9.3 9.2  Total Protein 6.5 - 8.1 g/dL 6.3(L) - -  Total Bilirubin 0.3 - 1.2 mg/dL 0.6 - -  Alkaline Phos 38 - 126 U/L 96 - -  AST 15 - 41 U/L 21 - -  ALT 14 - 54 U/L 20 - -    ABG    Component Value Date/Time   PHART 7.448 03/21/2016 1101   PCO2ART 60.6 (H) 03/21/2016 1101   PO2ART 42.0 (L) 03/21/2016 1101   HCO3 42.0 (H) 03/21/2016 1101   TCO2 44 03/21/2016 1101   O2SAT 78.0 59/56/3875 6433  Metabolic alkalosis with respiratory acidosis   EKG: Sinus tach, no ST changes, TWI, or Q waves.  Normal QTc.  RSR' in V1 and V2.  CXR: No edema or consolidation  CT Chest: 3 x 2.5 cm LUL lung mass  Assessment & Plan by Problem: Principal Problem:   Acute encephalopathy Active Problems:   Anxiety and depression   Bipolar 1 disorder (HCC)   COPD (chronic obstructive pulmonary disease) (HCC)   UTI (lower urinary tract infection)   Acute respiratory failure with hypoxia and hypercarbia (HCC)   Hypercalcemia  #Hypoxic Hypercapnic Respiratory Failure Increasing home O2 requirement, and hypoxic and hypercarbic here likely representing COPD exacerbation No history of URI symptoms or cough, but history very limited.  She has a significant leukocytosis, which may be partially explained by corticosteroids, but pneumonia is also a concern.  No effusion or PTX. -PO prednisone 40 -DuoNebs -BiPAP  #Hypercalcemia #Lung Mass Asymptomatic, but significantly elevated (corrected Ca 14.1)  indicates correction. 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 -IVF 200 mL/hr normal saline. -4U/kg calcitonin q6h x4 -Discuss options for further diagnosis and treatment and/or palliative care with family tomorrow  #Altered Mental Status Hypercarbia, hypercalcemia, delerium, psychiatric disease are possible contributing etiologies.  Will address her general medical conditions and reassess. -Clarify baseline with husband  #UTI No signs of pyelonephritis or sepsis. -Continue macrobid  #Bipolar Disorder #Schizophrenia -Continue home meds  Dispo: Admit patient to Inpatient with expected length of stay greater than 2 midnights.  Signed: Minus Liberty, MD 03/21/2016, 2:23 PM  Pager: [phone]
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
## 2016-03-21 NOTE — ED Notes (Signed)
When respiratory was here drawing blood gas, she applied venturi mask for low sats MD made aware

## 2016-03-22 DIAGNOSIS — J9690 Respiratory failure, unspecified, unspecified whether with hypoxia or hypercapnia: Secondary | ICD-10-CM | POA: Diagnosis present

## 2016-03-22 DIAGNOSIS — N390 Urinary tract infection, site not specified: Secondary | ICD-10-CM

## 2016-03-22 DIAGNOSIS — I10 Essential (primary) hypertension: Secondary | ICD-10-CM | POA: Diagnosis present

## 2016-03-22 DIAGNOSIS — Z515 Encounter for palliative care: Secondary | ICD-10-CM | POA: Diagnosis not present

## 2016-03-22 DIAGNOSIS — R491 Aphonia: Secondary | ICD-10-CM | POA: Diagnosis present

## 2016-03-22 DIAGNOSIS — G9340 Encephalopathy, unspecified: Secondary | ICD-10-CM | POA: Diagnosis present

## 2016-03-22 DIAGNOSIS — Z7189 Other specified counseling: Secondary | ICD-10-CM | POA: Diagnosis not present

## 2016-03-22 DIAGNOSIS — B9689 Other specified bacterial agents as the cause of diseases classified elsewhere: Secondary | ICD-10-CM

## 2016-03-22 DIAGNOSIS — F419 Anxiety disorder, unspecified: Secondary | ICD-10-CM | POA: Diagnosis present

## 2016-03-22 DIAGNOSIS — Z681 Body mass index (BMI) 19 or less, adult: Secondary | ICD-10-CM | POA: Diagnosis not present

## 2016-03-22 DIAGNOSIS — F209 Schizophrenia, unspecified: Secondary | ICD-10-CM | POA: Diagnosis present

## 2016-03-22 DIAGNOSIS — R402132 Coma scale, eyes open, to sound, at arrival to emergency department: Secondary | ICD-10-CM | POA: Diagnosis present

## 2016-03-22 DIAGNOSIS — C3490 Malignant neoplasm of unspecified part of unspecified bronchus or lung: Secondary | ICD-10-CM | POA: Diagnosis not present

## 2016-03-22 DIAGNOSIS — J449 Chronic obstructive pulmonary disease, unspecified: Secondary | ICD-10-CM

## 2016-03-22 DIAGNOSIS — Z885 Allergy status to narcotic agent status: Secondary | ICD-10-CM | POA: Diagnosis not present

## 2016-03-22 DIAGNOSIS — E43 Unspecified severe protein-calorie malnutrition: Secondary | ICD-10-CM | POA: Diagnosis present

## 2016-03-22 DIAGNOSIS — F319 Bipolar disorder, unspecified: Secondary | ICD-10-CM | POA: Diagnosis not present

## 2016-03-22 DIAGNOSIS — R918 Other nonspecific abnormal finding of lung field: Secondary | ICD-10-CM | POA: Diagnosis present

## 2016-03-22 DIAGNOSIS — J9621 Acute and chronic respiratory failure with hypoxia: Secondary | ICD-10-CM | POA: Diagnosis present

## 2016-03-22 DIAGNOSIS — R4182 Altered mental status, unspecified: Secondary | ICD-10-CM | POA: Diagnosis not present

## 2016-03-22 DIAGNOSIS — R402252 Coma scale, best verbal response, oriented, at arrival to emergency department: Secondary | ICD-10-CM | POA: Diagnosis present

## 2016-03-22 DIAGNOSIS — E785 Hyperlipidemia, unspecified: Secondary | ICD-10-CM | POA: Diagnosis present

## 2016-03-22 DIAGNOSIS — J441 Chronic obstructive pulmonary disease with (acute) exacerbation: Secondary | ICD-10-CM | POA: Diagnosis present

## 2016-03-22 DIAGNOSIS — J9622 Acute and chronic respiratory failure with hypercapnia: Secondary | ICD-10-CM | POA: Diagnosis present

## 2016-03-22 DIAGNOSIS — R64 Cachexia: Secondary | ICD-10-CM | POA: Diagnosis present

## 2016-03-22 DIAGNOSIS — Z955 Presence of coronary angioplasty implant and graft: Secondary | ICD-10-CM | POA: Diagnosis not present

## 2016-03-22 DIAGNOSIS — F17210 Nicotine dependence, cigarettes, uncomplicated: Secondary | ICD-10-CM | POA: Diagnosis present

## 2016-03-22 DIAGNOSIS — Z9981 Dependence on supplemental oxygen: Secondary | ICD-10-CM

## 2016-03-22 DIAGNOSIS — I2510 Atherosclerotic heart disease of native coronary artery without angina pectoris: Secondary | ICD-10-CM | POA: Diagnosis present

## 2016-03-22 DIAGNOSIS — E874 Mixed disorder of acid-base balance: Secondary | ICD-10-CM | POA: Diagnosis present

## 2016-03-22 DIAGNOSIS — R402362 Coma scale, best motor response, obeys commands, at arrival to emergency department: Secondary | ICD-10-CM | POA: Diagnosis present

## 2016-03-22 LAB — URINE CULTURE: CULTURE: NO GROWTH

## 2016-03-22 LAB — BASIC METABOLIC PANEL
ANION GAP: 9 (ref 5–15)
BUN: 17 mg/dL (ref 6–20)
CALCIUM: 11.3 mg/dL — AB (ref 8.9–10.3)
CHLORIDE: 100 mmol/L — AB (ref 101–111)
CO2: 35 mmol/L — AB (ref 22–32)
Creatinine, Ser: 0.71 mg/dL (ref 0.44–1.00)
GFR calc non Af Amer: >60 mL/min (ref >60)
GLUCOSE: 113 mg/dL — AB (ref 65–99)
POTASSIUM: 3.5 mmol/L (ref 3.5–5.1)
Sodium: 144 mmol/L (ref 135–145)

## 2016-03-22 LAB — PARATHYROID HORMONE, INTACT (NO CA): PTH: 10 pg/mL — ABNORMAL LOW (ref 15–65)

## 2016-03-22 LAB — CALCIUM, IONIZED: CALCIUM, IONIZED, SERUM: 7.4 mg/dL — AB (ref 4.5–5.6)

## 2016-03-22 LAB — VITAMIN D 25 HYDROXY (VIT D DEFICIENCY, FRACTURES): VIT D 25 HYDROXY: 90.6 ng/mL (ref 30.0–100.0)

## 2016-03-22 MED ORDER — ZOLEDRONIC ACID 4 MG/5ML IV CONC
4.0000 mg | Freq: Once | INTRAVENOUS | Status: AC
  Administered 2016-03-22: 4 mg via INTRAVENOUS
  Filled 2016-03-22: qty 5

## 2016-03-22 NOTE — Progress Notes (Signed)
Patient is currently resting comfortably on 6L [place] with O2 sat 97%. Vitals are stable and patient is in no respiratory distress at this time. Bipap is not needed at this time. RT will continue to monitor as needed.

## 2016-03-22 NOTE — Discharge Summary (Signed)
Name: Caroline Hill MRN: [id number] DOB: April 08, 1946 70 y.o. PCP: [name], MD  Date of Admission: 03/21/2016  9:01 AM Date of Discharge: 03/25/2016 Attending Physician: Axel Filler, MD  Discharge Diagnosis:  1. Hypercalcemia of Malignancy 2. Lung Mass  Principal Problem:   Hypercalcemia Active Problems:   Anxiety and depression   Bipolar 1 disorder (HCC)   COPD (chronic obstructive pulmonary disease) (HCC)   Acute encephalopathy   UTI (lower urinary tract infection)   Acute respiratory failure with hypoxia and hypercarbia (HCC)   Respiratory failure (HCC)   Encounter for hospice care discussion   Palliative care encounter   Goals of care, counseling/discussion   Discharge Medications:   Medication List    STOP taking these medications   aspirin 325 MG EC tablet   Cholecalciferol 5000 units Tabs   methocarbamol 500 MG tablet Commonly known as:  ROBAXIN   multivitamin with minerals Tabs tablet   nitrofurantoin (macrocrystal-monohydrate) 100 MG capsule Commonly known as:  MACROBID   predniSONE 10 MG tablet Commonly known as:  DELTASONE   zolpidem 5 MG tablet Commonly known as:  AMBIEN     TAKE these medications   amLODipine-benazepril 5-10 MG capsule Commonly known as:  LOTREL Take 1 capsule by mouth daily.   benztropine 0.5 MG tablet Commonly known as:  COGENTIN Take 0.5 mg by mouth at bedtime.   bisacodyl 10 MG suppository Commonly known as:  DULCOLAX Place 1 suppository (10 mg total) rectally daily as needed for moderate constipation.   budesonide 0.5 MG/2ML nebulizer solution Commonly known as:  PULMICORT TAKE 2 MLS (0.5 MG TOTAL) BY NEBULIZATION 2 (TWO) TIMES DAILY.   busPIRone 10 MG tablet Commonly known as:  BUSPAR Take 10 mg by mouth 2 (two) times daily.   dextromethorphan-guaiFENesin 30-600 MG 12hr tablet Commonly known as:  MUCINEX DM Take 1 tablet by mouth 2 (two) times daily. What changed:  when to take  this  reasons to take this   docusate sodium 100 MG capsule Commonly known as:  COLACE Take 1 capsule (100 mg total) by mouth 2 (two) times daily as needed for mild constipation. What changed:  when to take this  reasons to take this   feeding supplement (ENSURE ENLIVE) Liqd Take 237 mLs by mouth 3 (three) times daily between meals.   FLUoxetine 40 MG capsule Commonly known as:  PROZAC Take 40 mg by mouth every morning.   lamoTRIgine 100 MG tablet Commonly known as:  LAMICTAL Take 100 mg by mouth daily with breakfast.   levofloxacin 250 MG tablet Commonly known as:  LEVAQUIN Take 1 tablet (250 mg total) by mouth daily.   OXYGEN Inhale 3 L into the lungs continuous. Continuous every night and 4 times a day as needed during the day   PROAIR HFA 108 (90 Base) MCG/ACT inhaler Generic drug:  albuterol INHALE 2 PUFFS INTO THE LUNGS EVERY 4 (FOUR) HOURS AS NEEDED FOR WHEEZING OR SHORTNESS OF BREATH.   risperiDONE 2 MG tablet Commonly known as:  RISPERDAL Take 2 mg by mouth at bedtime.   traZODone 100 MG tablet Commonly known as:  DESYREL Take 50 mg by mouth at bedtime.       Disposition and follow-up:   Caroline Hill was discharged from [facility] in stable condition.  At the hospital follow up visit please address:  1.  Hypercalcemia.  May need repeat zoledronic acid on or after 9/14.  2.  Altered Mental Status.  Thought to be acutely due to hypercalcemia.  Her psychotropic meds were briefly held, but re-started unchanged by discharge.  3.  COPD. O2 requirement at baseline?  4.  UTI. Ensure no recurrence of urinary symptoms.  5. Palliative Care/Home Hospice.  Ensure home hospice services are being delivered.  5.  Labs / imaging needed at time of follow-up: none  6.  Pending labs/ test needing follow-up: none  Follow-up Appointments: Follow-up Information    BADGER,MICHAEL C, MD. Schedule an appointment as soon as possible for a visit  today.   Specialty:  Family Medicine Why:  for 1-2 weeks for hospital follow up. Contact information: [place] [address] [phone]           Hospital Course by problem list: Principal Problem:   Hypercalcemia Active Problems:   Anxiety and depression   Bipolar 1 disorder (HCC)   COPD (chronic obstructive pulmonary disease) (HCC)   Acute encephalopathy   UTI (lower urinary tract infection)   Acute respiratory failure with hypoxia and hypercarbia (HCC)   Respiratory failure ([place])   Encounter for hospice care discussion   Palliative care encounter   Goals of care, counseling/discussion   1. Altered Mental Status [name] presented with several days of confusion, nausea, vomiting, and increased home O2 requirement from 3L to 4L.  Initial concerns regarding the etiology of her AMS with for hypercalcemia, hypercapnia, infection, and psychiatric disease.  With her dramatically elevated calcium and improvement of mental status with treatment, hypercalcemia was deemed to be the etiology.  Her psychotropic medications were briefly held while her AMS was being evaluated due to concern for polypharmacy, but were resumed unchanged prior to discharge.  2. Hypercalcemia At admission, her serum calcium was significantly elevated with corrected calcium of 14.1 and ionized calcium 7.4,  Her hypercalcemia was initially managed with IV fluids and calcitonin, to which the calcium responded.  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  To continue controlling her hypercalcemia, she was given IV zoledronic acid on 03/22/16.  At discharge, her serum calcium had fallen from 12.9 to 9.2.  3. Lung Mass She has an extensive smoking history, and had previously been under surveillance for multiple lung nodules.  She had a recent pathologic fracutre of her left femur.  CT chest revealed new  left upper lobe lung mass.  Her husband said she had previously expressed a desire not to undergo invasive or intensive workup/treatment for malignancy.  4. UTI She had been treated with ceftriaxone once and nitrofurantoin daily since several days prior to admission for a UTI diagnosed on 9/5 by Vicenta Aly, NP, at [facility].  Urine culture from that visit grew pan-sensitive E Coli.  Urinalysis and urine culture from her admission here on 9/6 with no signs of infection.  She developed a fever on 9/8 and was given ceftriaxone once.  5. COPD With the history of increasing home O2 requirement and ABG showing hypoxia and hypercapnia, there was initially concern for COPD exacerbation and CO2 retention driving her AMS.  She was given nebulizer treatments and 40 mg prednisone for 2 days, but her O2 requirement returned to baseline and she had no cough or change in sputum to otherwise confirm the diagnosis of COPD exacerbation.  Discharge Vitals:   BP (!) 152/98 (BP Location: Left Arm)   Pulse (!) 118   Temp 100 F (37.8 C) (Oral)   Resp Marland Kitchen)  24   Ht '5\' 6"'$  (1.676 m)   Wt 98 lb 5.2 oz (44.6 kg)   SpO2 94%   BMI 15.87 kg/m    Physical Exam General: Patient is cachectic, in no acute distress, and cooperative with exam.  Hypophonic voice consistent with reported laryngeal injury.  Cardiovascular: Tachycardic, regular rhythm Pulmonary/Chest: Clear to auscultation bilaterally Abdominal: Soft, non-tender Extremities: No lower extremity edema bilaterally  Pertinent Labs, Studies, and Procedures:  CBC Latest Ref Rng & Units 03/24/2016 03/23/2016 03/23/2016  WBC 4.0 - 10.5 K/uL 13.6(H) 13.9(H) 18.8(H)  Hemoglobin 12.0 - 15.0 g/dL 10.6(L) 10.6(L) 11.2(L)  Hematocrit 36.0 - 46.0 % 34.6(L) 35.8(L) 36.2  Platelets 150 - 400 K/uL 381 403(H) 401(H)   Component     Latest Ref Rng & Units 03/21/2016 03/21/2016 03/22/2016 03/23/2016 03/24/2016        10:03 AM  7:56 PM     Calcium     8.9 - 10.3 mg/dL 12.9 (H) 12.3 (H)  11.3 (H) 10.3 9.2  Albumin     3.5 - 5.0 g/dL 2.5 (L)       Component     Latest Ref Rng & Units 03/21/2016  Calcium, Ionized, Serum     4.5 - 5.6 mg/dL 7.4 (H)   Component     Latest Ref Rng & Units 03/21/2016  PTH     15 - 65 pg/mL 10 (L)   CT Chest w/ Contrast (03/21/2016): 3 cm irregular mass in the medial left upper lobe, with contiguous abnormal soft tissue along the aortic arch and great vessels in the left anterior mediastinum concerning for lung cancer with mediastinal extension/ spread. This could be further characterized with PET CT.  Discharge Instructions: Discharge Instructions    Diet - low sodium heart healthy    Complete by:  As directed   Increase activity slowly    Complete by:  As directed      Signed: Minus Liberty, MD 03/25/2016, 7:58 AM   Pager: [phone]

## 2016-03-22 NOTE — Progress Notes (Signed)
Subjective: Patient's respiratory status was the primary issue over night. She desaturated to 86% on 4 L/min NS and was accordingly boosted to 6L/min, bringing her SpO2 at 91%. On visit, she remained largely non-interactive, without signs of obvious respiratory distress.  Objective: Vital signs in last 24 hours: Vitals:   03/22/16 0400 03/22/16 0500 03/22/16 0700 03/22/16 0911  BP:  (!) 172/95 112/62   Pulse: (!) 104 (!) 102 (!) 106   Resp: (!) 31 (!) 26 (!) 27   Temp: 99.3 F (37.4 C)  99.1 F (37.3 C)   TempSrc:      SpO2:   97% 99%  Weight:      Height:       Weight change:   Intake/Output Summary (Last 24 hours) at 03/22/16 1017 Last data filed at 03/22/16 0900  Gross per 24 hour  Intake          2848.33 ml  Output             2845 ml  Net             3.33 ml   Physical Exam: General: cachetic, drowsy Respiratory: diminished breath sounds throughout, no wheezes or rales appreciated CV: RRR no MRG  Lab Results: BMP: Na 144, K 3.5, Cl 100, Co2 35, BUN 17, Cr .71, Ca 11.3, Mg 1.9, Anion gap 9 TSH 1.106 25 Hydroxy Vitamin D: 90.6  Micro Results: Recent Results (from the past 240 hour(s))  MRSA PCR Screening     Status: None   Collection Time: 03/21/16  4:10 PM  Result Value Ref Range Status   MRSA by PCR NEGATIVE NEGATIVE Final    Comment:        The GeneXpert MRSA Assay (FDA approved for NASAL specimens only), is one component of a comprehensive MRSA colonization surveillance program. It is not intended to diagnose MRSA infection nor to guide or monitor treatment for MRSA infections.    Studies/Results: Ct Chest Wo Contrast  Result Date: 03/21/2016 CLINICAL DATA:  COPD.  Found down. EXAM: CT CHEST WITHOUT CONTRAST TECHNIQUE: Multidetector CT imaging of the chest was performed following the standard protocol without IV contrast. COMPARISON:  03/21/2016.  Chest CT 05/31/2015. FINDINGS: Cardiovascular: Diffuse aortic calcifications. No aneurysm. Diffuse  coronary artery calcifications. Heart is normal size. Mediastinum/Nodes: There is abnormal soft tissue noted along the anterior mediastinum adjacent to the aortic arch and great vessels, measuring approximately 6.9 x 3.2 cm on image 22. This may be contiguous with an abnormal masslike area within the medial left upper lobe and is concerning for mediastinal involvement of lung cancer. Lungs/Pleura: Moderate centrilobular emphysema. 3.0 x 2.5 cm irregular mass in the medial left upper lobe just above the aortic arch. This may be contiguous with the abnormal mediastinal soft tissue described above. This is concerning for primary lung cancer. 5 mm nodule in the left upper lobe on image 42. No additional pulmonary nodules. No pleural effusions. Upper Abdomen: Imaging into the upper abdomen shows no acute findings. Low-density enlargement of the left adrenal gland is again noted and stable since prior study, most compatible with adenoma. Musculoskeletal: Chest wall soft tissues are unremarkable. No acute bony abnormality or focal bone lesion. IMPRESSION: 3 cm irregular mass in the medial left upper lobe, with contiguous abnormal soft tissue along the aortic arch and great vessels in the left anterior mediastinum concerning for lung cancer with mediastinal extension/ spread. This could be further characterized with PET CT. Moderate centrilobular emphysema. 5 mm left  upper lobe nodule. This is nonspecific. Recommend attention on follow-up imaging. Electronically Signed   By: Rolm Baptise M.D.   On: 03/21/2016 15:51   Dg Chest Port 1 View  Result Date: 03/21/2016 CLINICAL DATA:  Altered mental status EXAM: PORTABLE CHEST 1 VIEW COMPARISON:  January 14, 2016 FINDINGS: There is stable mild elevation of the left hemidiaphragm. There is no edema or consolidation. The heart size and pulmonary vascularity are normal. There is atherosclerotic calcification in the aortic arch. There is no adenopathy. There is degenerative change in  the thoracic spine with thoracolumbar dextroscoliosis. IMPRESSION: No edema or consolidation.  Aortic atherosclerosis. Electronically Signed   By: Lowella Grip III M.D.   On: 03/21/2016 10:53   Medications:  Scheduled Meds: . aspirin  325 mg Oral Q breakfast  . benztropine  0.5 mg Oral QHS  . budesonide  0.5 mg Nebulization BID  . calcitonin  4 Units/kg Intramuscular Q6H  . dextromethorphan-guaiFENesin  1 tablet Oral BID  . docusate sodium  100 mg Oral BID  . enoxaparin (LOVENOX) injection  40 mg Subcutaneous Q24H  . feeding supplement (ENSURE ENLIVE)  237 mL Oral TID BM  . FLUoxetine  40 mg Oral q morning - 10a  . ipratropium-albuterol  3 mL Nebulization QID  . lamoTRIgine  100 mg Oral Q breakfast  . mouth rinse  15 mL Mouth Rinse BID  . multivitamin with minerals  1 tablet Oral BH-q7a  . nitrofurantoin (macrocrystal-monohydrate)  100 mg Oral BID  . predniSONE  40 mg Oral Q breakfast  . risperiDONE  2 mg Oral QHS  . sodium chloride  250 mL Intravenous Once   Continuous Infusions: . sodium chloride 200 mL/hr at 03/22/16 0648   PRN Meds:.albuterol, bisacodyl, methocarbamol Assessment/Plan: Principal Problem:   Acute encephalopathy Active Problems:   Anxiety and depression   Bipolar 1 disorder (HCC)   COPD (chronic obstructive pulmonary disease) (HCC)   UTI (lower urinary tract infection)   Acute respiratory failure with hypoxia and hypercarbia (HCC)   Hypercalcemia  Hypercalcemia:  --Calcitonin and fluids thus far have lowered her Ca from 12.9 to 11.3; continue Calcitonin '4mg'$  q6hr --Begin IV zoledronic acid '4mg'$  (once weekly recommended) --Continue IV fluids NS at 260m/hr --Plan discussion with husband regarding goals of care with possible lung cancer  Altered Mental Status: --Hold psych medications (lamotrigine, fluoxetine, zolpidem, trazodone, buspirone, benztropine) in order to see baseline mental status --Continue oxygenation to maintain Sp02 between  88-92% --Continue DuoNebs and prednisone '40mg'$   [facility]. As of her visit to the clinic on 03/20/16 for evaluation of UTI (cultures pending), Caroline Hill was able to "follow the conversation" and could mouth yes or no, but she did not seem to be processing everything. AOuida Sillsmentioned that her behavior was not too different from one month prior [facility]. She noted an 8 pound weight loss since early July 2017.   This is a MCareers information officerNote.  The care of the patient was discussed with Dr. OInda Castleand the assessment and plan formulated with their assistance.  Please see their attached note for official documentation of the daily encounter.   LOS: 0 days   TGlendale Chard Medical Student 03/22/2016, 10:17 AM

## 2016-03-22 NOTE — Progress Notes (Signed)
   Subjective: Sleeping in bed, waking only briefly and unable to communicate.  Objective:  Vital signs in last 24 hours: Vitals:   03/22/16 0345 03/22/16 0348 03/22/16 0400 03/22/16 0500  BP: (!) 171/88   (!) 172/95  Pulse: (!) 103 (!) 103 (!) 104 (!) 102  Resp: (!) 32 (!) 32 (!) 31 (!) 26  Temp: 99.5 F (37.5 C) 99.3 F (37.4 C) 99.3 F (37.4 C)   TempSrc: Core (Comment) Core (Comment)    SpO2: 95%     Weight:      Height:       Physical Exam  Constitutional:  Somnolent, cachectic woman opens her eyes briefly to touch  Cardiovascular: Normal rate and regular rhythm.   Pulmonary/Chest:  Reduced breath sounds in all lung fields No respiratory distress  Neurological:  GCS 9 (E2V2M5)  Skin: Skin is warm and dry.    BMP Latest Ref Rng & Units 03/22/2016 03/21/2016 03/21/2016  Glucose 65 - 99 mg/dL 113(H) 146(H) 110(H)  BUN 6 - 20 mg/dL 17 23(H) 27(H)  Creatinine 0.44 - 1.00 mg/dL 0.71 0.89 0.97  Sodium 135 - 145 mmol/L 144 143 143  Potassium 3.5 - 5.1 mmol/L 3.5 3.1(L) 3.4(L)  Chloride 101 - 111 mmol/L 100(L) 100(L) 97(L)  CO2 22 - 32 mmol/L 35(H) 37(H) 40(H)  Calcium 8.9 - 10.3 mg/dL 11.3(H) 12.3(H) 12.9(H)     Assessment/Plan:  Principal Problem:   Acute encephalopathy Active Problems:   Anxiety and depression   Bipolar 1 disorder (HCC)   COPD (chronic obstructive pulmonary disease) (HCC)   UTI (lower urinary tract infection)   Acute respiratory failure with hypoxia and hypercarbia (HCC)   Hypercalcemia  #Altered Mental Status Her significant hypercalcemia is likely etiology.  She is significantly less alert today than on admission.  Additionally, hypercarbia, polypharmacy, and hospital delerium are possibilities. 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 -IV  zoledronic acid -F/u PTH -Discuss further workup and management with husband  #COPD Now maintaing O2 sats on [place] -DuoNebs -40 mg PO prednisone for 5 days (last 9/10) -Supplemental O2, BiPAP as needed  #UTI -Continue nitrofurantoin  Dispo: Anticipated discharge in approximately 3 day(s).   Minus Liberty, MD 03/22/2016, 6:59 AM Pager: [phone]

## 2016-03-22 NOTE — Care Management Obs Status (Signed)
[place] NOTIFICATION   Patient Details  Name: Caroline Hill MRN: [id number] Date of Birth: 03/04/1946   Medicare Observation Status Notification Given:  Yes    MayoKym Groom, RN 03/22/2016, 3:58 PM

## 2016-03-23 DIAGNOSIS — F319 Bipolar disorder, unspecified: Secondary | ICD-10-CM

## 2016-03-23 DIAGNOSIS — Z515 Encounter for palliative care: Secondary | ICD-10-CM

## 2016-03-23 DIAGNOSIS — Z7189 Other specified counseling: Secondary | ICD-10-CM

## 2016-03-23 LAB — CBC WITH DIFFERENTIAL/PLATELET
Basophils Absolute: 0 10*3/uL (ref 0.0–0.1)
Basophils Relative: 0 %
EOS PCT: 0 %
Eosinophils Absolute: 0 10*3/uL (ref 0.0–0.7)
HEMATOCRIT: 35.8 % — AB (ref 36.0–46.0)
HEMOGLOBIN: 10.6 g/dL — AB (ref 12.0–15.0)
LYMPHS ABS: 0.3 10*3/uL — AB (ref 0.7–4.0)
Lymphocytes Relative: 2 %
MCH: 26.2 pg (ref 26.0–34.0)
MCHC: 29.6 g/dL — ABNORMAL LOW (ref 30.0–36.0)
MCV: 88.4 fL (ref 78.0–100.0)
Monocytes Absolute: 0.3 10*3/uL (ref 0.1–1.0)
Monocytes Relative: 2 %
NEUTROS ABS: 13.3 10*3/uL — AB (ref 1.7–7.7)
NEUTROS PCT: 96 %
Platelets: 403 10*3/uL — ABNORMAL HIGH (ref 150–400)
RBC: 4.05 MIL/uL (ref 3.87–5.11)
RDW: 14.1 % (ref 11.5–15.5)
WBC: 13.9 10*3/uL — AB (ref 4.0–10.5)

## 2016-03-23 LAB — URINALYSIS, ROUTINE W REFLEX MICROSCOPIC
Bilirubin Urine: NEGATIVE
GLUCOSE, UA: 100 mg/dL — AB
Ketones, ur: NEGATIVE mg/dL
Nitrite: NEGATIVE
PH: 8 (ref 5.0–8.0)
Protein, ur: NEGATIVE mg/dL
SPECIFIC GRAVITY, URINE: 1.022 (ref 1.005–1.030)

## 2016-03-23 LAB — BASIC METABOLIC PANEL
ANION GAP: 9 (ref 5–15)
BUN: 15 mg/dL (ref 6–20)
CALCIUM: 10.3 mg/dL (ref 8.9–10.3)
CHLORIDE: 96 mmol/L — AB (ref 101–111)
CO2: 36 mmol/L — ABNORMAL HIGH (ref 22–32)
CREATININE: 0.56 mg/dL (ref 0.44–1.00)
GFR calc non Af Amer: >60 mL/min (ref >60)
Glucose, Bld: 100 mg/dL — ABNORMAL HIGH (ref 65–99)
Potassium: 3.4 mmol/L — ABNORMAL LOW (ref 3.5–5.1)
SODIUM: 141 mmol/L (ref 135–145)

## 2016-03-23 LAB — URINE MICROSCOPIC-ADD ON

## 2016-03-23 LAB — CBC
HEMATOCRIT: 36.2 % (ref 36.0–46.0)
HEMOGLOBIN: 11.2 g/dL — AB (ref 12.0–15.0)
MCH: 27.4 pg (ref 26.0–34.0)
MCHC: 30.9 g/dL (ref 30.0–36.0)
MCV: 88.5 fL (ref 78.0–100.0)
Platelets: 401 10*3/uL — ABNORMAL HIGH (ref 150–400)
RBC: 4.09 MIL/uL (ref 3.87–5.11)
RDW: 14.3 % (ref 11.5–15.5)
WBC: 18.8 10*3/uL — AB (ref 4.0–10.5)

## 2016-03-23 MED ORDER — SODIUM CHLORIDE 0.9 % IV SOLN
INTRAVENOUS | Status: DC
Start: 2016-03-23 — End: 2016-03-24
  Administered 2016-03-24: 03:00:00 via INTRAVENOUS

## 2016-03-23 MED ORDER — LAMOTRIGINE 100 MG PO TABS
100.0000 mg | ORAL_TABLET | Freq: Every day | ORAL | Status: DC
  Administered 2016-03-23 – 2016-03-24 (×2): 100 mg via ORAL
  Filled 2016-03-23 (×2): qty 1

## 2016-03-23 MED ORDER — CEFTRIAXONE SODIUM 1 G IJ SOLR
1.0000 g | INTRAMUSCULAR | Status: DC
  Administered 2016-03-23: 1 g via INTRAVENOUS
  Filled 2016-03-23 (×2): qty 10

## 2016-03-23 MED ORDER — FLUOXETINE HCL 20 MG PO CAPS
40.0000 mg | ORAL_CAPSULE | Freq: Every morning | ORAL | Status: DC
  Administered 2016-03-23 – 2016-03-24 (×2): 40 mg via ORAL
  Filled 2016-03-23 (×2): qty 2

## 2016-03-23 MED ORDER — POTASSIUM CHLORIDE CRYS ER 20 MEQ PO TBCR
40.0000 meq | EXTENDED_RELEASE_TABLET | Freq: Once | ORAL | Status: AC
  Administered 2016-03-23: 40 meq via ORAL
  Filled 2016-03-23: qty 2

## 2016-03-23 MED ORDER — SODIUM CHLORIDE 0.9 % IV BOLUS (SEPSIS)
250.0000 mL | Freq: Once | INTRAVENOUS | Status: AC
  Administered 2016-03-23: 250 mL via INTRAVENOUS

## 2016-03-23 NOTE — Progress Notes (Signed)
Subjective: Patient's respiratory status improved overnight, requiring only 3L (her home regimen) to maintain O2 sats at 100%. She endorsed good appetite and thirst, and said that her hand tremors had decreased from their recent level. Similarly, she mentioned feeling more focused and less clouded in her thoughts.  Objective: Vital signs in last 24 hours: Vitals:   03/23/16 0500 03/23/16 0600 03/23/16 0814 03/23/16 0817  BP:  (!) 153/77    Pulse:  (!) 105    Resp:  (!) 29    Temp: 99.5 F (37.5 C) 99.7 F (37.6 C)    TempSrc:      SpO2:   98% 99%  Weight:      Height:       Weight change:   Intake/Output Summary (Last 24 hours) at 03/23/16 1108 Last data filed at 03/23/16 0600  Gross per 24 hour  Intake             2265 ml  Output             1925 ml  Net              340 ml   Physical Exam: General: cachetic, upright in bed, conversant and follows conversation with clear answers Respiratory: diminished breath sounds throughout, slight expiratory wheezing appreciated at bilateral apices with faint rales at bases CV: RRR no MRG Abdomen: soft, nontender  Lab Results: BMP: Na 141, K 3.4, Cl 96, Co2 36, BUN 15, Cr .56, Ca 10.3, Glucose 100, Anion gap 9 CBC: WBC 18.8, Hgb 11.2, Hct 36.2, Plts 401 PTH: 10   Micro Results: Recent Results (from the past 240 hour(s))  Culture, blood (Routine X 2) w Reflex to ID Panel     Status: None (Preliminary result)   Collection Time: 03/21/16 10:55 AM  Result Value Ref Range Status   Specimen Description BLOOD LEFT ANTECUBITAL  Final   Special Requests BOTTLES DRAWN AEROBIC AND ANAEROBIC  5CC  Final   Culture NO GROWTH 2 DAYS  Final   Report Status PENDING  Incomplete  Culture, blood (Routine X 2) w Reflex to ID Panel     Status: None (Preliminary result)   Collection Time: 03/21/16 11:00 AM  Result Value Ref Range Status   Specimen Description BLOOD RIGHT FOREARM  Final   Special Requests BOTTLES DRAWN AEROBIC AND ANAEROBIC  5CC   Final   Culture NO GROWTH 2 DAYS  Final   Report Status PENDING  Incomplete  Urine culture     Status: None   Collection Time: 03/21/16 11:20 AM  Result Value Ref Range Status   Specimen Description URINE, CATHETERIZED  Final   Special Requests NONE  Final   Culture NO GROWTH  Final   Report Status 03/22/2016 FINAL  Final  MRSA PCR Screening     Status: None   Collection Time: 03/21/16  4:10 PM  Result Value Ref Range Status   MRSA by PCR NEGATIVE NEGATIVE Final    Comment:        The GeneXpert MRSA Assay (FDA approved for NASAL specimens only), is one component of a comprehensive MRSA colonization surveillance program. It is not intended to diagnose MRSA infection nor to guide or monitor treatment for MRSA infections.    Studies/Results: Ct Chest Wo Contrast  Result Date: 03/21/2016 CLINICAL DATA:  COPD.  Found down. EXAM: CT CHEST WITHOUT CONTRAST TECHNIQUE: Multidetector CT imaging of the chest was performed following the standard protocol without IV contrast. COMPARISON:  03/21/2016.  Chest CT 05/31/2015. FINDINGS: Cardiovascular: Diffuse aortic calcifications. No aneurysm. Diffuse coronary artery calcifications. Heart is normal size. Mediastinum/Nodes: There is abnormal soft tissue noted along the anterior mediastinum adjacent to the aortic arch and great vessels, measuring approximately 6.9 x 3.2 cm on image 22. This may be contiguous with an abnormal masslike area within the medial left upper lobe and is concerning for mediastinal involvement of lung cancer. Lungs/Pleura: Moderate centrilobular emphysema. 3.0 x 2.5 cm irregular mass in the medial left upper lobe just above the aortic arch. This may be contiguous with the abnormal mediastinal soft tissue described above. This is concerning for primary lung cancer. 5 mm nodule in the left upper lobe on image 42. No additional pulmonary nodules. No pleural effusions. Upper Abdomen: Imaging into the upper abdomen shows no acute  findings. Low-density enlargement of the left adrenal gland is again noted and stable since prior study, most compatible with adenoma. Musculoskeletal: Chest wall soft tissues are unremarkable. No acute bony abnormality or focal bone lesion. IMPRESSION: 3 cm irregular mass in the medial left upper lobe, with contiguous abnormal soft tissue along the aortic arch and great vessels in the left anterior mediastinum concerning for lung cancer with mediastinal extension/ spread. This could be further characterized with PET CT. Moderate centrilobular emphysema. 5 mm left upper lobe nodule. This is nonspecific. Recommend attention on follow-up imaging. Electronically Signed   By: Rolm Baptise M.D.   On: 03/21/2016 15:51   Medications:  Scheduled Meds: . budesonide  0.5 mg Nebulization BID  . dextromethorphan-guaiFENesin  1 tablet Oral BID  . docusate sodium  100 mg Oral BID  . enoxaparin (LOVENOX) injection  40 mg Subcutaneous Q24H  . feeding supplement (ENSURE ENLIVE)  237 mL Oral TID BM  . ipratropium-albuterol  3 mL Nebulization QID  . mouth rinse  15 mL Mouth Rinse BID  . multivitamin with minerals  1 tablet Oral BH-q7a  . sodium chloride  250 mL Intravenous Once   Continuous Infusions:   PRN Meds:.albuterol, bisacodyl Assessment/Plan: Principal Problem:   Hypercalcemia Active Problems:   Anxiety and depression   Bipolar 1 disorder (HCC)   COPD (chronic obstructive pulmonary disease) (HCC)   Acute encephalopathy   UTI (lower urinary tract infection)   Acute respiratory failure with hypoxia and hypercarbia (HCC)   Respiratory failure (HCC)  Hypercalcemia:  --Calcitonin and fluids thus far have lowered her Ca from 12.9 to 11.3 to 10.3 --PTH level below normal limits and the CT evidence of a lung mass point her diagnosis more firmly to hypercalcemia of malignancy, namely squamous cell carcinoma of the lung, and the lack of anemia/renal dysfunction downplays the likelihood of multiple myeloma  as the malignancy in question; could consider ordering  PTH-rP to solidify the diagnosis but this is not likely necessary given the strong clinical evidence --First dose of IV zoledronic acid received yesterday with 2 days of IV fluids seemed to have provided both laboratory and clinical evidence of a reduction in calcium levels -- Ms. Berdan seems less drowsy as the levels have returned to more normal amounts --Plan for repeat dose of '4mg'$  of IV zoledronic acid on 9/14;  --Evidence from two RCTs finds 1. '4mg'$  ZA takes 4 days on average to achieve complete effect, and 2. median length of efficacy of 1 dose of '4mg'$  ZA is 32 days --If ZA proves ineffective, may consider denosumab   Altered Mental Status: --Hold psych medications (lamotrigine, fluoxetine, zolpidem, trazodone, buspirone, benztropine) in order to see baseline  mental status; perhaps consider adding only fluoxetine upon discharge --Continue 3L of [place] (home regimen) --Continue DuoNebs and prednisone '40mg'$     This is a Careers information officer Note.  The care of the patient was discussed with Dr. Inda Castle and the assessment and plan formulated with their assistance.  Please see their attached note for official documentation of the daily encounter.   LOS: 1 day   Glendale Chard, Medical Student 03/23/2016, 11:08 AM

## 2016-03-23 NOTE — Consult Note (Signed)
Consultation Note Date: 03/23/2016   Patient Name: Caroline Hill  DOB: 01-Feb-1946  MRN: [id number]  Age / Sex: 70 y.o., female  PCP: Caroline Noon, MD Referring Physician: Axel Filler, MD  Reason for Consultation: Establishing goals of care  HPI/Patient Profile: 70 y.o. female  with past medical history of lung nodules, schizophrenia and bipolar disorder, recent femur fracture, and COPD on home oxygen 3L, who was admitted on 03/21/2016 with COPD exacerbation, altered mental status and hypercalcemia. She initially required Bipap, but is now breathing comfortably on 6L of oxygen nasal canula. Her hypercalcemia has been treated with IV fluids and zometa and as her serum calcium has begun to normalize her mental status has improved significantly.   Chest CT done this admission shows a 3 cm irregular mass in the left upper lobe that is concerning for lung CA with mediastinal spread.    Lung cancer may well be contributing to her hypercalcemia.  Clinical Assessment and Goals of Care: Dr. Ola Hill and I met with the patient alone this morning and then with her husband this afternoon.   This morning the patient was extremely pleasant, but tremulous and hallucinating.  She was calm and told me that she realized her hallucinations were not real.  She was seeing things moving on the wall in front of her.  When we met with Caroline Hill - He discussed being very surprised.  He never knew of the lung nodules and the news of cancer this admission is quite surprising to him.  He talked of the leg fracture a couple of months ago.  In a follow up visit regarding her leg the doctor mentioned that the fracture was likely due to bone cancer.   Caroline Hill decided at that time that she did not want invasive work up, surgery, chemotherapy or radiation.  They have told their children.  We discussed that the leg fracture and the hypercalcemia  were both likely related to the lung cancer.  Without work up we will not know with certainty.  Caroline Hill understands this.  We discussed that without treatment, hypercalcemia has a prognosis of approximately 6 weeks.  However Caroline Hill' calcium levels are responding well to treatment.  We discussed Caroline Hill' care once she leaves the hospital.  Caroline Hill has been bathing her and offering help with ADLs at home.  He would appreciate hospice services in his home.  As she does not want treatment for her cancer and would prefer not to return to the hospital, hospice services seem very appropriate.  HCPOA:  Caroline Hill, Husband.    SUMMARY OF RECOMMENDATIONS    Please restart Caroline Hill's psychotropic medications.  She is hallucinating and showing evidence of early bipolar exacerbation.  Code Status/Advance Care Planning:  DNR   Symptom Management:   Not currently in pain.  Would D/C with a script for a small amount of liquid morphine.  She will also need a bowel regimen if taking opioids.  Hospice will manage pain medications going forward.  Palliative Prophylaxis:   Bowel  Regimen  Additional Recommendations (Limitations, Scope, Preferences):  Full Comfort Care, Minimize Medications, No Chemotherapy, No Diagnostics, No Radiation, No Surgical Procedures and No Tracheostomy  Psycho-social/Spiritual:   Desire for further Chaplaincy support:no  Additional Recommendations: Caregiving  Support/Resources  Prognosis:   < 6 months - given metastatic cancer, pathologic fractures, albumin 2.5, desire not to receive chemo / rad, and desire not to return to the hospital.  Discharge Planning: Home with Hospice      Primary Diagnoses: Present on Admission: . Acute respiratory failure with hypoxia and hypercarbia (HCC) . Hypercalcemia . Acute encephalopathy . Bipolar 1 disorder ([place]) . Anxiety and depression . COPD (chronic obstructive pulmonary disease) ([place]) . UTI (lower urinary tract infection) .  Respiratory failure ([place])   I have reviewed the medical record, interviewed the patient and family, and examined the patient. The following aspects are pertinent.  Past Medical History:  Diagnosis Date  . Arthritis    "hands, feet" (12/01/2013)  . Bipolar disorder, unspecified ([place])   . CAD (coronary artery disease)   . COPD (chronic obstructive pulmonary disease) ([place])   . Depression   . Hyperlipidemia   . Hypertension   . Neuromuscular disorder ([place])   . On home oxygen therapy    "3L at night and during day prn" (12/01/2013)  . Other malaise and fatigue   . Schizophrenia ([place])   . Shortness of breath    with exertion   . Tobacco use disorder   . Unspecified constipation   . Unspecified vitamin D deficiency    Social History   Social History  . Marital status: Legally Separated    Spouse name: N/A  . Number of children: 2  . Years of education: 12   Occupational History  . disabled    Social History Main Topics  . Smoking status: Current Every Day Smoker    Packs/day: 1.00    Years: 51.00    Types: Cigarettes    Last attempt to quit: 11/26/2013  . Smokeless tobacco: Never Used     Comment: smokes 5 cigarettes daily (11/25/14)  . Alcohol use No  . Drug use: No     Comment: 12/01/2013 "marijuana maybe 1-2 times/wk"  . Sexual activity: No   Other Topics Concern  . None   Social History Narrative   Patient lives at home alone and she is se prated. Patient is retired. Patient has high school education.    Caffeine. Soda one daily.   Right handed.   Family History  Problem Relation Age of Onset  . Heart disease Mother   . Brain cancer Paternal Grandfather   . Colon cancer Neg Hx    Scheduled Meds: . budesonide  0.5 mg Nebulization BID  . dextromethorphan-guaiFENesin  1 tablet Oral BID  . docusate sodium  100 mg Oral BID  . enoxaparin (LOVENOX) injection  40 mg Subcutaneous Q24H  . feeding supplement (ENSURE ENLIVE)  237 mL Oral TID BM  .  ipratropium-albuterol  3 mL Nebulization QID  . mouth rinse  15 mL Mouth Rinse BID  . multivitamin with minerals  1 tablet Oral BH-q7a  . sodium chloride  250 mL Intravenous Once   Continuous Infusions:  PRN Meds:.albuterol, bisacodyl Medications Prior to Admission:  Prior to Admission medications   Medication Sig Start Date End Date Taking? Authorizing Provider  amLODipine-benazepril (LOTREL) 5-10 MG capsule Take 1 capsule by mouth daily. 11/04/15  Yes Historical Provider, MD  aspirin EC 325 MG EC tablet Take 1 tablet (  325 mg total) by mouth daily with breakfast. 01/10/16  Yes Mcarthur Rossetti, MD  benztropine (COGENTIN) 0.5 MG tablet Take 0.5 mg by mouth at bedtime. 12/09/15  Yes Historical Provider, MD  budesonide (PULMICORT) 0.5 MG/2ML nebulizer solution TAKE 2 MLS (0.5 MG TOTAL) BY NEBULIZATION 2 (TWO) TIMES DAILY. 02/08/16  Yes Juanito Doom, MD  busPIRone (BUSPAR) 10 MG tablet Take 10 mg by mouth 2 (two) times daily. 04/21/15  Yes Historical Provider, MD  Cholecalciferol 5000 UNITS TABS Take 5,000 Units by mouth daily.   Yes Historical Provider, MD  dextromethorphan-guaiFENesin (MUCINEX DM) 30-600 MG 12hr tablet Take 1 tablet by mouth 2 (two) times daily. Patient taking differently: Take 1 tablet by mouth 2 (two) times daily as needed for cough.  01/16/16  Yes Maryann Mikhail, DO  feeding supplement, ENSURE ENLIVE, (ENSURE ENLIVE) LIQD Take 237 mLs by mouth 3 (three) times daily between meals. 01/16/16  Yes Maryann Mikhail, DO  FLUoxetine (PROZAC) 40 MG capsule Take 40 mg by mouth every morning. 11/21/15  Yes Historical Provider, MD  lamoTRIgine (LAMICTAL) 100 MG tablet Take 100 mg by mouth daily with breakfast.    Yes Historical Provider, MD  methocarbamol (ROBAXIN) 500 MG tablet Take 1 tablet (500 mg total) by mouth every 6 (six) hours as needed for muscle spasms (prn for pain as well). 01/14/16  Yes Mcarthur Rossetti, MD  Multiple Vitamin (MULTIVITAMIN WITH MINERALS) TABS tablet  Take 1 tablet by mouth every morning.    Yes Historical Provider, MD  nitrofurantoin, macrocrystal-monohydrate, (MACROBID) 100 MG capsule Take 100 mg by mouth 2 (two) times daily. 03/20/16 03/27/16 Yes Historical Provider, MD  OXYGEN Inhale 3 L into the lungs continuous. Continuous every night and 4 times a day as needed during the day   Yes Historical Provider, MD  PROAIR HFA 108 (90 Base) MCG/ACT inhaler INHALE 2 PUFFS INTO THE LUNGS EVERY 4 (FOUR) HOURS AS NEEDED FOR WHEEZING OR SHORTNESS OF BREATH. 08/08/15  Yes Juanito Doom, MD  risperiDONE (RISPERDAL) 2 MG tablet Take 2 mg by mouth at bedtime. 04/22/15  Yes Historical Provider, MD  traZODone (DESYREL) 100 MG tablet Take 50 mg by mouth at bedtime.  10/12/15  Yes Historical Provider, MD  bisacodyl (DULCOLAX) 10 MG suppository Place 1 suppository (10 mg total) rectally daily as needed for moderate constipation. Patient not taking: Reported on 03/21/2016 01/16/16   Velta Addison Mikhail, DO  docusate sodium (COLACE) 100 MG capsule Take 1 capsule (100 mg total) by mouth 2 (two) times daily. Patient not taking: Reported on 03/21/2016 01/16/16   Velta Addison Mikhail, DO  predniSONE (DELTASONE) 10 MG tablet Take 1 tablet (10 mg total) by mouth daily with breakfast. Take '40mg'$  (4 tabs) x 3 days, then taper to '30mg'$  (3 tabs) x 3 days, then '20mg'$  (2 tabs) x 3days, then '10mg'$  (1 tab) x 3days, then OFF. Patient not taking: Reported on 03/21/2016 01/16/16   Velta Addison Mikhail, DO  zolpidem (AMBIEN) 5 MG tablet Take 1 tablet (5 mg total) by mouth at bedtime as needed for sleep. Patient not taking: Reported on 03/21/2016 01/16/16   Cristal Ford, DO   Allergies  Allergen Reactions  . Oxycodone-Acetaminophen Nausea And Vomiting  . Oxycontin [Oxycodone Hcl] Nausea Only   Review of Systems:  No current complaints.  Physical Exam  Thin, pleasantly confused female, in NAD, tremulous CV irreg irreg resp NAD Abdomen Diffusely tender in suprapubic area Extremities able to move all 4, no  edema.  Vital Signs: BP [place] Kitchen)  153/77   Pulse (!) 105   Temp 99.7 F (37.6 C)   Resp (!) 29   Ht '5\' 6"'$  (1.676 m)   Wt 44.6 kg (98 lb 5.2 oz)   SpO2 99%   BMI 15.87 kg/m  Pain Assessment: No/denies pain   Pain Score: 0-No pain   SpO2: SpO2: 99 % O2 Device:SpO2: 99 % O2 Flow Rate: .O2 Flow Rate (L/min): 3 L/min  IO: Intake/output summary:  Intake/Output Summary (Last 24 hours) at 03/23/16 1010 Last data filed at 03/23/16 0600  Gross per 24 hour  Intake             2265 ml  Output             2725 ml  Net             -460 ml    LBM: Last BM Date: 03/21/16 Baseline Weight: Weight: 45.4 kg (100 lb) Most recent weight: Weight: 44.6 kg (98 lb 5.2 oz)     Palliative Assessment/Data:     Time In: 3:30 Time Out: 4:40 Time Total: 70 min. Greater than 50%  of this time was spent counseling and coordinating care related to the above assessment and plan.  Signed by: Melton Alar, PA-C   Please contact Palliative Medicine Team phone at [phone] for questions and concerns.  For individual provider: See Shea Evans

## 2016-03-23 NOTE — Progress Notes (Signed)
Subjective: No acute events over night.  She is alert and able to communicate better than yesterday, but still limited by her laryngeal injury.  She complains of some mild lower back pain.  Objective:  Vital signs in last 24 hours: Vitals:   03/23/16 0814 03/23/16 0817 03/23/16 1100 03/23/16 1229  BP:   (!) 132/101   Pulse:   (!) 121   Resp:   (!) 23   Temp:   99.7 F (37.6 C)   TempSrc:   Oral   SpO2: 98% 99% 96% 99%  Weight:      Height:       Physical Exam  General:  Alert, cooperative, hypophonic but able to vocalize, occasionally comprehensible  Cardiovascular: Normal rate and regular rhythm.   Pulmonary/Chest:  Reduced breath sounds in all lung fields No respiratory distress  Neurological:  Alert, cooperative, moving all extremities Skin: Skin is warm and dry.   CBC Latest Ref Rng & Units 03/23/2016 03/21/2016 03/21/2016  WBC 4.0 - 10.5 K/uL 18.8(H) 21.0(H) 18.5(H)  Hemoglobin 12.0 - 15.0 g/dL 11.2(L) 11.6(L) 11.7(L)  Hematocrit 36.0 - 46.0 % 36.2 39.2 39.3  Platelets 150 - 400 K/uL 401(H) 455(H) 413(H)    BMP Latest Ref Rng & Units 03/23/2016 03/22/2016 03/21/2016  Glucose 65 - 99 mg/dL 100(H) 113(H) 146(H)  BUN 6 - 20 mg/dL 15 17 23(H)  Creatinine 0.44 - 1.00 mg/dL 0.56 0.71 0.89  Sodium 135 - 145 mmol/L 141 144 143  Potassium 3.5 - 5.1 mmol/L 3.4(L) 3.5 3.1(L)  Chloride 101 - 111 mmol/L 96(L) 100(L) 100(L)  CO2 22 - 32 mmol/L 36(H) 35(H) 37(H)  Calcium 8.9 - 10.3 mg/dL 10.3 11.3(H) 12.3(H)   Component     Latest Ref Rng & Units 03/21/2016  PTH     15 - 65 pg/mL 10 (L)   Urinalysis    Component Value Date/Time   COLORURINE YELLOW 03/21/2016 1120   APPEARANCEUR HAZY (A) 03/21/2016 1120   LABSPEC 1.020 03/21/2016 1120   PHURINE 6.0 03/21/2016 1120   GLUCOSEU NEGATIVE 03/21/2016 1120   HGBUR NEGATIVE 03/21/2016 1120   BILIRUBINUR NEGATIVE 03/21/2016 1120   KETONESUR NEGATIVE 03/21/2016 1120   PROTEINUR NEGATIVE 03/21/2016 1120   NITRITE NEGATIVE 03/21/2016  1120   LEUKOCYTESUR NEGATIVE 03/21/2016 1120   UCx no growth  Assessment/Plan:  Principal Problem:   Hypercalcemia Active Problems:   Anxiety and depression   Bipolar 1 disorder (HCC)   COPD (chronic obstructive pulmonary disease) (HCC)   Acute encephalopathy   UTI (lower urinary tract infection)   Acute respiratory failure with hypoxia and hypercarbia (HCC)   Respiratory failure (HCC)  #Lung Mass #Hypercalcemia New lung mass and severe hypercalcemia concerning for hypercalcemia of malignancy.  Per husband, she has expressed that she does not want invasive testing and treatment. -IV zoledronic acid given 9/7.  May require additional dose on 9/14. -Continue discussion regarding cancer workup with husband -Palliative Care consult  #Altered Mental Status Mental status has markedly improved, as today she is more alert and communicative.  Her AMS was likely from hypercalcemia, which is improving.  Her psychoactive medicines were held, and can consider reintroducing them. -Resume SSRI today  #COPD Now maintaing O2 sats on 3L , which is her home O2 requirement.  With her mentation improving and respiratory status at baseline, will discontinue prednisone as COPD exacerbation seems less likely and CO2 retention does not seem to be an etiology of her AMS. -DuoNebs -Supplemental O2  #UTI UA without signs  of infection, culture with no growth -Discontinue nitrofurantoin  Dispo: Anticipated discharge in approximately 2 day(s).   Minus Liberty, MD 03/23/2016, 1:03 PM Pager: [phone]

## 2016-03-24 DIAGNOSIS — G9340 Encephalopathy, unspecified: Secondary | ICD-10-CM

## 2016-03-24 DIAGNOSIS — C3490 Malignant neoplasm of unspecified part of unspecified bronchus or lung: Secondary | ICD-10-CM

## 2016-03-24 LAB — CBC WITH DIFFERENTIAL/PLATELET
BASOS ABS: 0 10*3/uL (ref 0.0–0.1)
Basophils Relative: 0 %
EOS ABS: 0.1 10*3/uL (ref 0.0–0.7)
EOS PCT: 1 %
HCT: 34.6 % — ABNORMAL LOW (ref 36.0–46.0)
Hemoglobin: 10.6 g/dL — ABNORMAL LOW (ref 12.0–15.0)
LYMPHS ABS: 1.2 10*3/uL (ref 0.7–4.0)
LYMPHS PCT: 9 %
MCH: 26.5 pg (ref 26.0–34.0)
MCHC: 30.6 g/dL (ref 30.0–36.0)
MCV: 86.5 fL (ref 78.0–100.0)
MONO ABS: 1.2 10*3/uL — AB (ref 0.1–1.0)
Monocytes Relative: 9 %
Neutro Abs: 11.1 10*3/uL — ABNORMAL HIGH (ref 1.7–7.7)
Neutrophils Relative %: 81 %
PLATELETS: 381 10*3/uL (ref 150–400)
RBC: 4 MIL/uL (ref 3.87–5.11)
RDW: 14.2 % (ref 11.5–15.5)
WBC: 13.6 10*3/uL — ABNORMAL HIGH (ref 4.0–10.5)

## 2016-03-24 LAB — BASIC METABOLIC PANEL
Anion gap: 9 (ref 5–15)
BUN: 16 mg/dL (ref 6–20)
CO2: 29 mmol/L (ref 22–32)
CREATININE: 0.51 mg/dL (ref 0.44–1.00)
Calcium: 9.2 mg/dL (ref 8.9–10.3)
Chloride: 99 mmol/L — ABNORMAL LOW (ref 101–111)
GFR calc Af Amer: >60 mL/min (ref >60)
GLUCOSE: 94 mg/dL (ref 65–99)
POTASSIUM: 3.7 mmol/L (ref 3.5–5.1)
Sodium: 137 mmol/L (ref 135–145)

## 2016-03-24 MED ORDER — IPRATROPIUM-ALBUTEROL 0.5-2.5 (3) MG/3ML IN SOLN: 3.0000 mL | Freq: Three times a day (TID) | RESPIRATORY_TRACT | Status: DC

## 2016-03-24 MED ORDER — LEVOFLOXACIN 250 MG PO TABS: 250.0000 mg | ORAL_TABLET | Freq: Every day | ORAL | 0 refills | Status: AC

## 2016-03-24 MED ORDER — DOCUSATE SODIUM 100 MG PO CAPS: 100.0000 mg | ORAL_CAPSULE | Freq: Two times a day (BID) | ORAL | 0 refills | Status: AC | PRN

## 2016-03-24 MED ORDER — BISACODYL 10 MG RE SUPP: 10.0000 mg | Freq: Every day | RECTAL | 1 refills | Status: AC | PRN

## 2016-03-24 NOTE — Progress Notes (Signed)
Subjective: Patient's respiratory status remained stable overnight, though she still feels short of breath. She also endorses burning with urination. Nurses report that she was fidgety and agitated overnight, and pulled out her IV once requiring the use of mittens.  Objective: Vital signs in last 24 hours: Vitals:   03/23/16 2200 03/23/16 2340 03/24/16 0000 03/24/16 0521  BP: 131/84 132/81 (!) 149/90 (!) 149/103  Pulse:  (!) 113 (!) 108   Resp: 14 (!) 25 (!) 29 (!) 30  Temp:  99.6 F (37.6 C)  98.9 F (37.2 C)  TempSrc:  Oral  Oral  SpO2:  94%  95%  Weight:      Height:       Weight change:   Intake/Output Summary (Last 24 hours) at 03/24/16 0657 Last data filed at 03/24/16 0549  Gross per 24 hour  Intake          1808.33 ml  Output              525 ml  Net          1283.33 ml   Physical Exam: General: cachetic, upright in bed, conversant and follows conversation with clear answers; oriented to person and place, not time Respiratory: diminished breath sounds throughout, slight expiratory wheezing appreciated at bilateral apices with faint rales at bases CV: tachycardic, no MRG Abdomen: soft, nontender, bowel sounds normoactive  Lab Results: BMP: Na 137, K 3.7, Cl 99, Co2 29, BUN 16, Cr .51, Ca 9.2, Glucose 94  CBC: WBC 13.6, Hgb 10.6, Hct 34.6, Plts 381  Urinalysis: many bacteria; glucose 100; large hemoglobin, moderate leukocytes; negative nitrites; many squamous epithelial cells, 6-30 WBCs, yeast present   Micro Results: Recent Results (from the past 240 hour(s))  Culture, blood (Routine X 2) w Reflex to ID Panel     Status: None (Preliminary result)   Collection Time: 03/21/16 10:55 AM  Result Value Ref Range Status   Specimen Description BLOOD LEFT ANTECUBITAL  Final   Special Requests BOTTLES DRAWN AEROBIC AND ANAEROBIC  5CC  Final   Culture NO GROWTH 2 DAYS  Final   Report Status PENDING  Incomplete  Culture, blood (Routine X 2) w Reflex to ID Panel      Status: None (Preliminary result)   Collection Time: 03/21/16 11:00 AM  Result Value Ref Range Status   Specimen Description BLOOD RIGHT FOREARM  Final   Special Requests BOTTLES DRAWN AEROBIC AND ANAEROBIC  5CC  Final   Culture NO GROWTH 2 DAYS  Final   Report Status PENDING  Incomplete  Urine culture     Status: None   Collection Time: 03/21/16 11:20 AM  Result Value Ref Range Status   Specimen Description URINE, CATHETERIZED  Final   Special Requests NONE  Final   Culture NO GROWTH  Final   Report Status 03/22/2016 FINAL  Final  MRSA PCR Screening     Status: None   Collection Time: 03/21/16  4:10 PM  Result Value Ref Range Status   MRSA by PCR NEGATIVE NEGATIVE Final    Comment:        The GeneXpert MRSA Assay (FDA approved for NASAL specimens only), is one component of a comprehensive MRSA colonization surveillance program. It is not intended to diagnose MRSA infection nor to guide or monitor treatment for MRSA infections.    Studies/Results: No results found. Medications:  Scheduled Meds: . budesonide  0.5 mg Nebulization BID  . cefTRIAXone (ROCEPHIN)  IV  1 g Intravenous  Q24H  . dextromethorphan-guaiFENesin  1 tablet Oral BID  . docusate sodium  100 mg Oral BID  . enoxaparin (LOVENOX) injection  40 mg Subcutaneous Q24H  . feeding supplement (ENSURE ENLIVE)  237 mL Oral TID BM  . FLUoxetine  40 mg Oral q morning - 10a  . ipratropium-albuterol  3 mL Nebulization QID  . lamoTRIgine  100 mg Oral Daily  . mouth rinse  15 mL Mouth Rinse BID  . multivitamin with minerals  1 tablet Oral BH-q7a   Continuous Infusions: . sodium chloride 125 mL/hr at 03/24/16 0549   PRN Meds:.albuterol, bisacodyl Assessment/Plan: Principal Problem:   Hypercalcemia Active Problems:   Anxiety and depression   Bipolar 1 disorder (HCC)   COPD (chronic obstructive pulmonary disease) (HCC)   Acute encephalopathy   UTI (lower urinary tract infection)   Acute respiratory failure with  hypoxia and hypercarbia (HCC)   Respiratory failure ([place])   Encounter for hospice care discussion   Palliative care encounter   Goals of care, counseling/discussion  Hypercalcemia:  --Calcium levels and mentation have shown marked improvement with the fluids and ZA initiated upon arrival.  --We will not pursue any further treatments in the outpatient setting for hypercalcemia of malignancy  UTI: --Given Ms. Seibold's temporary fever yesterday in the setting of the UTI for which she was being treated on arrival, we prescribed ceftriaxone but will switch to oral levofloxacin for outpatient control  Discharge: --Palliative care team met with patient and husband yesterday and decided that home hospice would be the preferred outcome. --With calcium levels normalized and prior fever and WBC count trending towards normal limits, Ms. Schoon appears to be in a state acceptable for beginning hospice care --Touched base with Dr. Rowe Pavy from palliative care who recommended only re-initiating home psych meds rather than any opioids as she transitions to hospice care given the lack of any acutely uncomfortable symptoms  Altered Mental Status: --restart home fluoxetine --restart home risperidone given report of hallucinations yesterday by nursing staff  This is a Medical Student Note.  The care of the patient was discussed with Dr. Inda Castle and the assessment and plan formulated with their assistance.  Please see their attached note for official documentation of the daily encounter.   LOS: 2 days   Glendale Chard, Medical Student 03/24/2016, 6:57 AM

## 2016-03-24 NOTE — Progress Notes (Signed)
Pt discharged with belongings (clothes and hair brush). Discharge instructions given to husband. Pt escorted out via wheelchair.

## 2016-03-24 NOTE — Progress Notes (Signed)
   Subjective: Patient was seen and examined this morning. She denies any complaints and is ready to go home.   Objective: Vital signs in last 24 hours: Vitals:   03/24/16 0521 03/24/16 0852 03/24/16 0855 03/24/16 0917  BP: (!) 149/103   (!) 152/98  Pulse:    (!) 118  Resp: (!) 30   (!) 24  Temp: 98.9 F (37.2 C)   100 F (37.8 C)  TempSrc: Oral   Oral  SpO2: 95% 95% 95% 94%  Weight:      Height:       Physical Exam General: Vital signs reviewed.  Patient is elderly, chronically ill appearing, in no acute distress and cooperative with exam.  Cardiovascular: Tachycardic, regular rhythm Pulmonary/Chest: Clear to auscultation bilaterally Abdominal: Soft, non-tender Extremities: No lower extremity edema bilaterally  Assessment/Plan: Principal Problem:   Hypercalcemia Active Problems:   Anxiety and depression   Bipolar 1 disorder (HCC)   COPD (chronic obstructive pulmonary disease) (HCC)   Acute encephalopathy   UTI (lower urinary tract infection)   Acute respiratory failure with hypoxia and hypercarbia (HCC)   Respiratory failure (HCC)   Encounter for hospice care discussion   Palliative care encounter   Goals of care, counseling/discussion  Hypercalcemia likely Secondary to Malignancy: Calcium has been correcting with IVF, Zoledronic Acid, and calcitonin. Corrected calcium is 10.7 this morning. Down from 14.4 on admission. Likely due to a newly diagnosed lung mass which family has decided not pursue aggressive work up for. Patient will be discharge to home today with plans for home Hospice. Patient has less than a 6 month prognosis given her potential metastatic cancer, pathologic fractures, low albumin and desire to not receive treatment for possible cancer and avoid hospitalization. We discussed starting any new medications for her at this time; however, patient is not in any pain. Palliative confirmed that Hospice will follow up with patient at her home and will manage  medications accordingly.  -Discharge to home with home hospice -Follow up with primary care physician  COPD: Patient is satting well on 3L [place], which is her home O2 requirement.   -Continue home 3L oxygen  UTI: Patient was admitted on Nitrofurantoin given recent UTI. UCx from that UTI was NGTD. Nitrofurantoin was discontinued. UA on admission without signs of infection, culture with no growth. Patient later developed a recurrent fever which is possibly due to a UTI or due to malignancy. BCx have been NGTD and WBC trending down. Patient was started on Ceftriaxone for possible UTI. We have opted to discharge patient on a short course of Levaquin to cover possible UTI.  -Levaquin 250 mg for 4 more days  Bipolar Disorder: Patient is on benztropine 0.5 mg QHS, buspirone 10 mg BiD, fluoxetine 40 mg QAM, trazodone 50 mg QHS, risperidone 2 mg QHS and lamictal 100 mg QD at home. These medications were held on admission due to concern of polypharmacy causing AMS; however, AMS was secondary to hypercalcemia.  -Resume home regimen  Dispo: Anticipated discharge in approximately 0 days.   LOS: 2 days   Martyn Malay, DO PGY-3 Internal Medicine Resident Pager # [phone] 03/24/2016 11:00 AM

## 2016-03-24 NOTE — Discharge Instructions (Signed)
Please take your medications as prescribed. We will treat you for 4 more days with Levaquin 250 mg daily for a urinary tract infection. Home Hospice will follow up with you after discharge to coordinate care. It was a pleasure to take care of you while you were here.

## 2016-03-26 LAB — CULTURE, BLOOD (ROUTINE X 2)
CULTURE: NO GROWTH
CULTURE: NO GROWTH

## 2016-05-16 DEATH — deceased

## 2016-08-04 IMAGING — CT CT CHEST LUNG CANCER SCREENING LOW DOSE W/O CM
2 of 5 series · 15 of 40 positions shown, 18 images · non-contrast
Comparison: CT chest dated 12/02/2013.  CT chest dated 08/17/2011.

CLINICAL DATA: 69-year-old female current smoker with 60 pack-year
history of smoking, for initial lung cancer screening

EXAM:
CT CHEST WITHOUT CONTRAST
TECHNIQUE: Multidetector CT imaging of the chest was performed following the
standard protocol without IV contrast.

[Series 3: lungs · axial · 0.60mm/px · z∈[-280,-26]mm · 12 of 61 slices shown, 15 images]
[im 5/61  mediastinal]
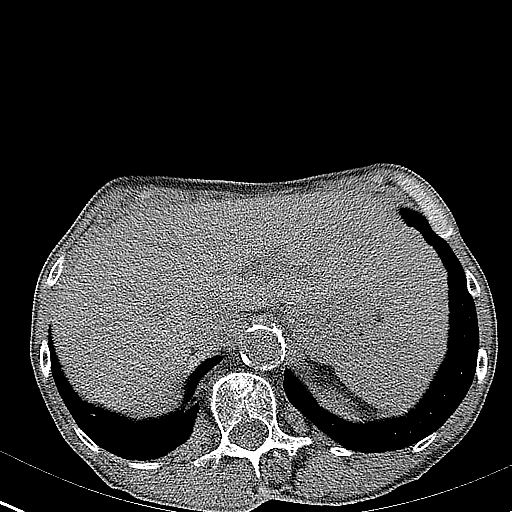
[im 5/61  lung]
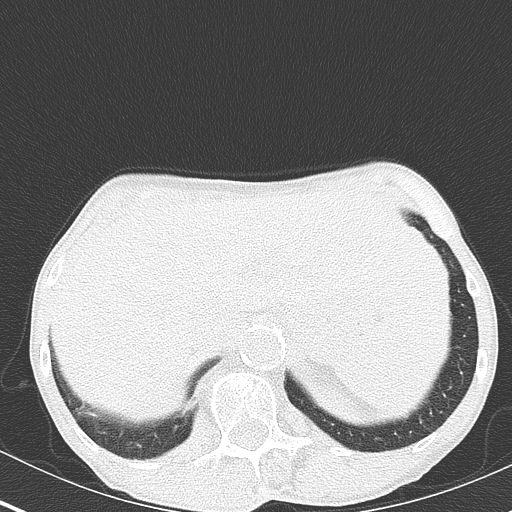
[im 10/61  lung]
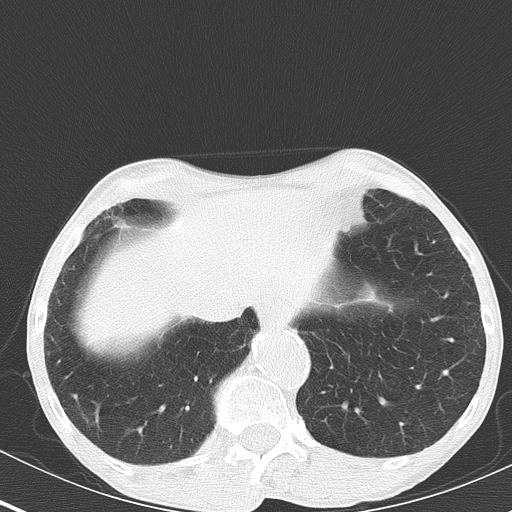
[im 14/61  lung]
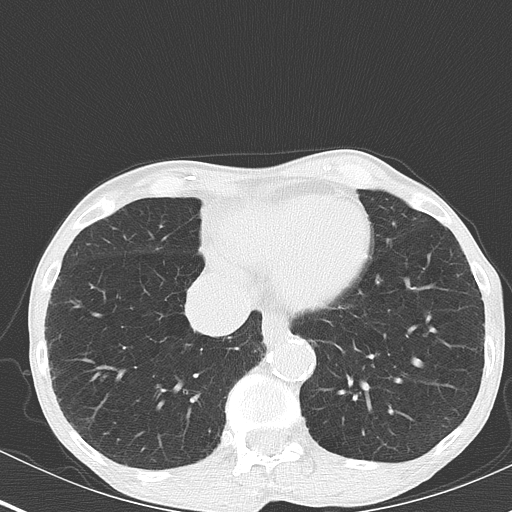
[im 19/61  lung]
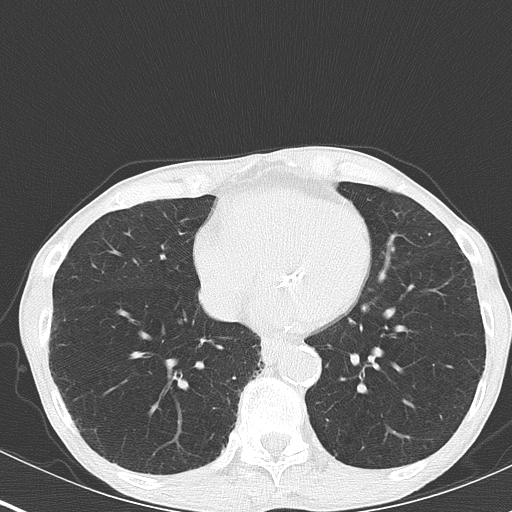
[im 24/61  mediastinal]
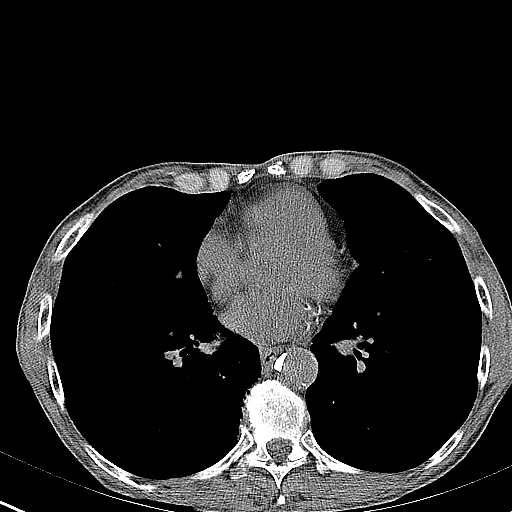
[im 24/61  lung]
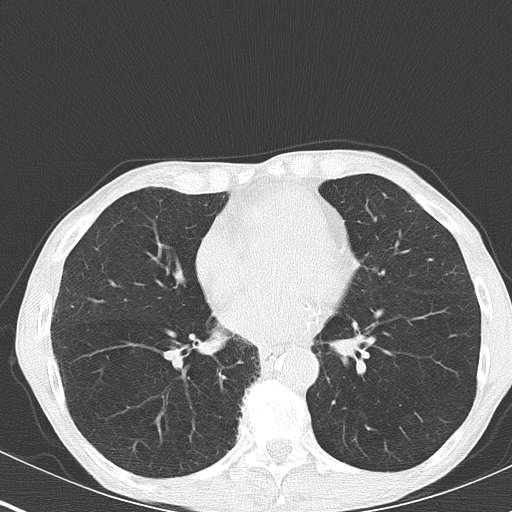
[im 28/61  lung]
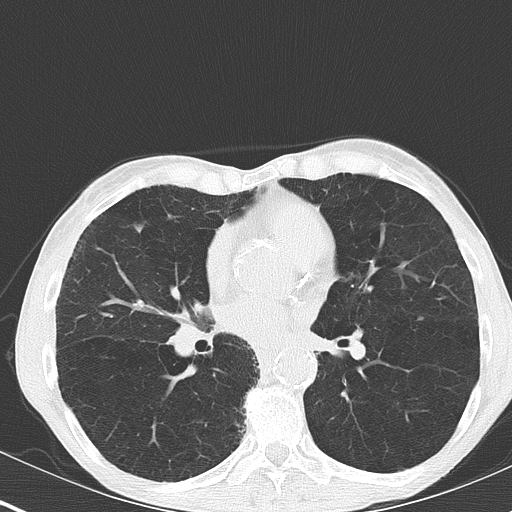
[im 33/61  lung]
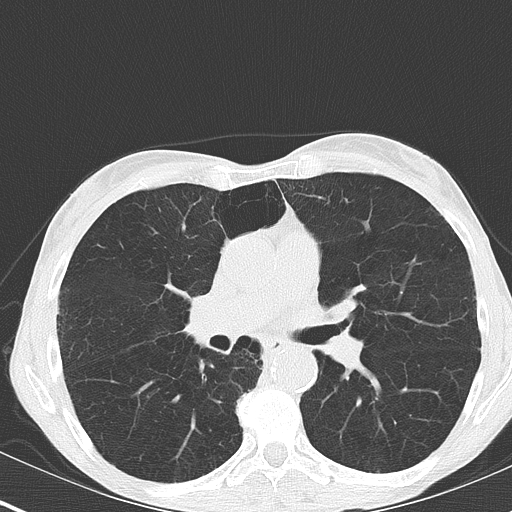
[im 37/61  lung]
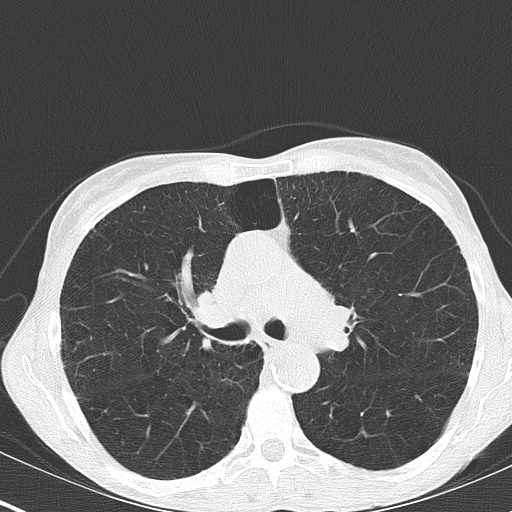
[im 42/61  mediastinal]
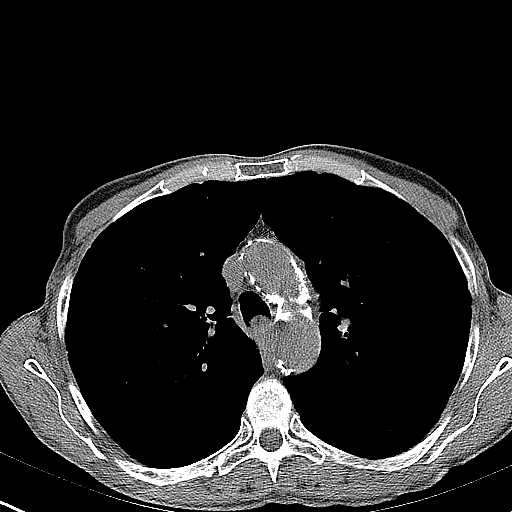
[im 42/61  lung]
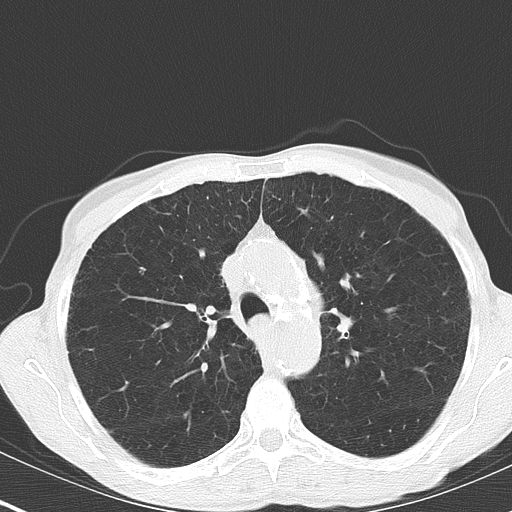
[im 47/61  lung]
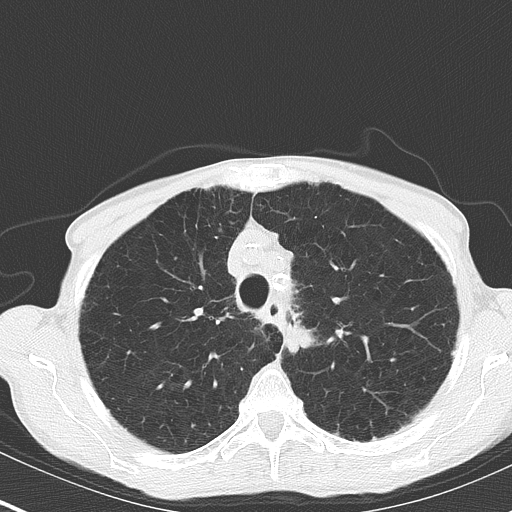
[im 51/61  lung]
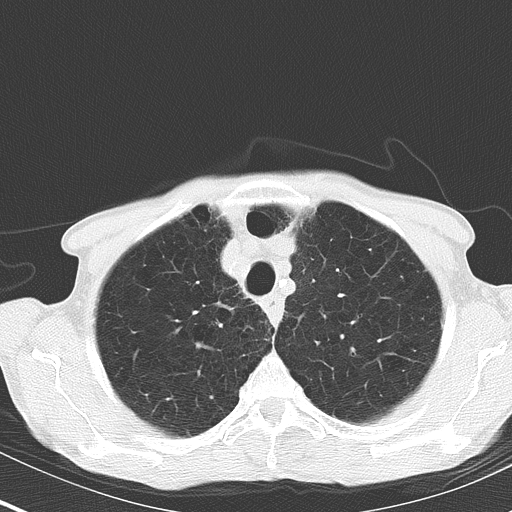
[im 56/61  lung]
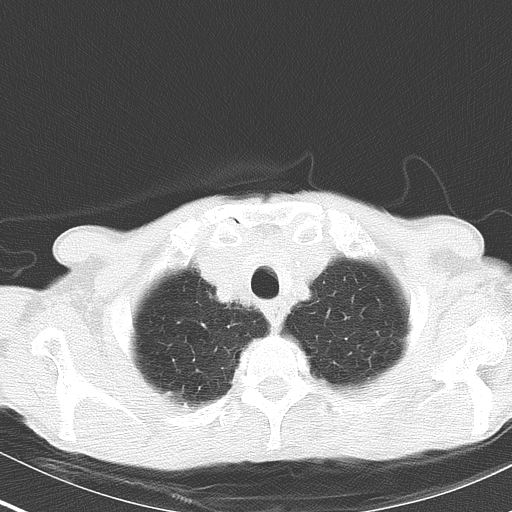

[Series 602: cor · coronal · 0.61mm/px · 3 of 105 slices shown]
[im 21/105  lung]
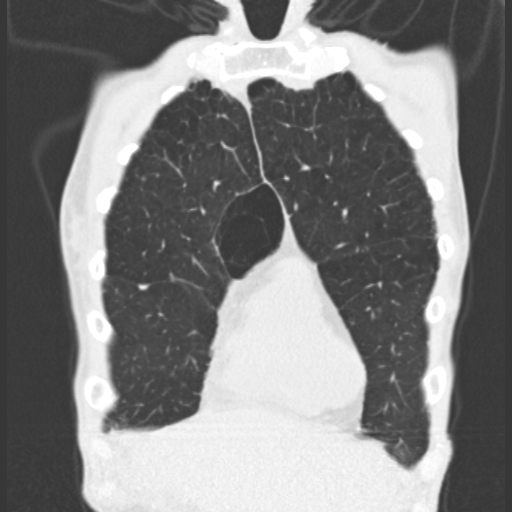
[im 42/105  lung]
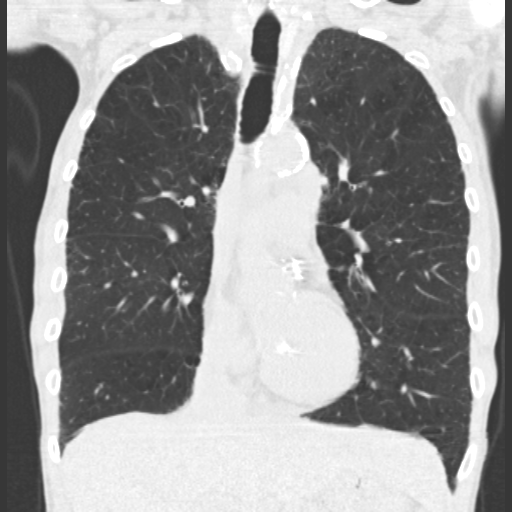
[im 63/105  lung]
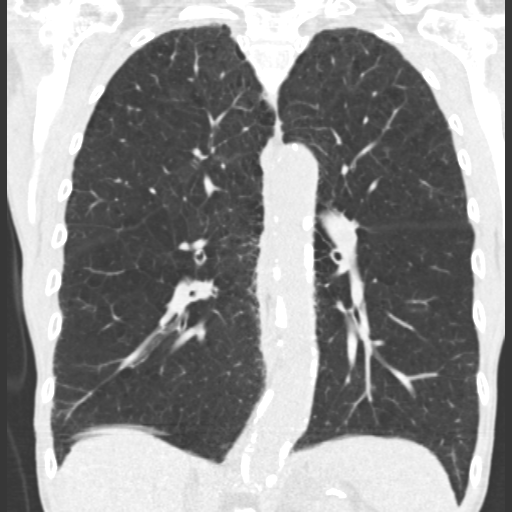

[15 of 40 positions shown; findings below may reference images not displayed]

FINDINGS: Mediastinum/Nodes: The heart is normal in size. No pericardial
effusion.

Coronary atherosclerosis.

Atherosclerotic calcifications of the aortic arch.

Small mediastinal lymph nodes which do not meet pathologic CT size
criteria.

Visualized thyroid is unremarkable.

Lungs/Pleura: Scattered bilateral pulmonary nodules, including:

--7.6 mm (volumetric mean) triangular nodule in the right middle
lobe along the minor fissure (series 5/image 176), technically Lung
Rads 3 by size criteria, but manually downgraded to Lung Rads 2
given stability since [DATE] mm nodule in the posterior left upper lobe (series 5/ image
89), unchanged

--4.1 mm subpleural nodule in the posterior right upper lobe (series
5/ image 34), likely reflecting benign pleural parenchymal scarring

Moderate centrilobular and paraseptal emphysematous changes,
including bullous changes in the medial right upper lobe.

No focal consolidation.

No pleural effusion or pneumothorax.

Upper abdomen: Visualized upper abdomen is notable for vascular
calcifications.

Musculoskeletal: Degenerative changes of the visualized
thoracolumbar spine.
IMPRESSION: Scattered bilateral pulmonary nodules, including a 7.6 mm probable
triangular subpleural lymph node in the right middle lobe, unchanged
since 7425.

Lung-RADS Category 2, benign appearance or behavior. Continue annual
screening with low-dose chest CT without contrast in 12 months.
